# Patient Record
Sex: Female | Born: 1970 | Race: Black or African American | Hispanic: No | Marital: Married | Smoking: Never smoker
Health system: Southern US, Community
[De-identification: ages and names within clinical notes are randomized; demographics above are authoritative.]

## PROBLEM LIST (undated history)

## (undated) DIAGNOSIS — A64 Unspecified sexually transmitted disease: Secondary | ICD-10-CM

## (undated) DIAGNOSIS — R002 Palpitations: Secondary | ICD-10-CM

## (undated) DIAGNOSIS — E119 Type 2 diabetes mellitus without complications: Secondary | ICD-10-CM

## (undated) DIAGNOSIS — G2581 Restless legs syndrome: Secondary | ICD-10-CM

## (undated) DIAGNOSIS — C169 Malignant neoplasm of stomach, unspecified: Secondary | ICD-10-CM

## (undated) DIAGNOSIS — R7303 Prediabetes: Secondary | ICD-10-CM

## (undated) DIAGNOSIS — I10 Essential (primary) hypertension: Secondary | ICD-10-CM

## (undated) DIAGNOSIS — C49A0 Gastrointestinal stromal tumor, unspecified site: Secondary | ICD-10-CM

## (undated) DIAGNOSIS — M1990 Unspecified osteoarthritis, unspecified site: Secondary | ICD-10-CM

## (undated) DIAGNOSIS — R87619 Unspecified abnormal cytological findings in specimens from cervix uteri: Secondary | ICD-10-CM

## (undated) DIAGNOSIS — G4733 Obstructive sleep apnea (adult) (pediatric): Secondary | ICD-10-CM

## (undated) DIAGNOSIS — D219 Benign neoplasm of connective and other soft tissue, unspecified: Secondary | ICD-10-CM

## (undated) DIAGNOSIS — J302 Other seasonal allergic rhinitis: Secondary | ICD-10-CM

## (undated) DIAGNOSIS — K219 Gastro-esophageal reflux disease without esophagitis: Secondary | ICD-10-CM

## (undated) DIAGNOSIS — Z87898 Personal history of other specified conditions: Secondary | ICD-10-CM

## (undated) DIAGNOSIS — R079 Chest pain, unspecified: Secondary | ICD-10-CM

## (undated) DIAGNOSIS — N907 Vulvar cyst: Secondary | ICD-10-CM

## (undated) DIAGNOSIS — F419 Anxiety disorder, unspecified: Secondary | ICD-10-CM

## (undated) HISTORY — DX: Other seasonal allergic rhinitis: J30.2

## (undated) HISTORY — DX: Restless legs syndrome: G25.81

## (undated) HISTORY — DX: Unspecified abnormal cytological findings in specimens from cervix uteri: R87.619

## (undated) HISTORY — DX: Chest pain, unspecified: R07.9

## (undated) HISTORY — DX: Essential (primary) hypertension: I10

## (undated) HISTORY — DX: Anxiety disorder, unspecified: F41.9

## (undated) HISTORY — DX: Gastrointestinal stromal tumor, unspecified site: C49.A0

## (undated) HISTORY — DX: Malignant neoplasm of stomach, unspecified: C16.9

## (undated) HISTORY — DX: Unspecified sexually transmitted disease: A64

## (undated) HISTORY — DX: Type 2 diabetes mellitus without complications: E11.9

## (undated) HISTORY — DX: Prediabetes: R73.03

## (undated) HISTORY — DX: Palpitations: R00.2

## (undated) HISTORY — PX: WISDOM TOOTH EXTRACTION: SHX21

## (undated) HISTORY — DX: Obstructive sleep apnea (adult) (pediatric): G47.33

## (undated) HISTORY — DX: Benign neoplasm of connective and other soft tissue, unspecified: D21.9

## (undated) HISTORY — DX: Gastro-esophageal reflux disease without esophagitis: K21.9

## (undated) HISTORY — DX: Personal history of other specified conditions: Z87.898

---

## 1999-10-15 ENCOUNTER — Other Ambulatory Visit: Admission: RE | Admit: 1999-10-15 | Discharge: 1999-10-15 | Payer: Self-pay | Admitting: Obstetrics and Gynecology

## 2000-02-11 ENCOUNTER — Other Ambulatory Visit: Admission: RE | Admit: 2000-02-11 | Discharge: 2000-02-11 | Payer: Self-pay | Admitting: Obstetrics and Gynecology

## 2000-03-02 ENCOUNTER — Other Ambulatory Visit: Admission: RE | Admit: 2000-03-02 | Discharge: 2000-03-02 | Payer: Self-pay | Admitting: Obstetrics and Gynecology

## 2000-06-02 ENCOUNTER — Other Ambulatory Visit: Admission: RE | Admit: 2000-06-02 | Discharge: 2000-06-02 | Payer: Self-pay | Admitting: Obstetrics and Gynecology

## 2000-10-20 ENCOUNTER — Other Ambulatory Visit: Admission: RE | Admit: 2000-10-20 | Discharge: 2000-10-20 | Payer: Self-pay | Admitting: Obstetrics and Gynecology

## 2001-02-16 ENCOUNTER — Other Ambulatory Visit: Admission: RE | Admit: 2001-02-16 | Discharge: 2001-02-16 | Payer: Self-pay | Admitting: Obstetrics and Gynecology

## 2001-09-19 ENCOUNTER — Other Ambulatory Visit: Admission: RE | Admit: 2001-09-19 | Discharge: 2001-09-19 | Payer: Self-pay | Admitting: Obstetrics and Gynecology

## 2002-12-24 ENCOUNTER — Other Ambulatory Visit: Admission: RE | Admit: 2002-12-24 | Discharge: 2002-12-24 | Payer: Self-pay | Admitting: Obstetrics and Gynecology

## 2004-01-28 ENCOUNTER — Other Ambulatory Visit: Admission: RE | Admit: 2004-01-28 | Discharge: 2004-01-28 | Payer: Self-pay | Admitting: Obstetrics and Gynecology

## 2004-02-01 DIAGNOSIS — R87619 Unspecified abnormal cytological findings in specimens from cervix uteri: Secondary | ICD-10-CM

## 2004-02-01 HISTORY — PX: COLPOSCOPY: SHX161

## 2004-02-01 HISTORY — DX: Unspecified abnormal cytological findings in specimens from cervix uteri: R87.619

## 2004-02-01 HISTORY — PX: CERVICAL BIOPSY  W/ LOOP ELECTRODE EXCISION: SUR135

## 2005-03-07 ENCOUNTER — Other Ambulatory Visit: Admission: RE | Admit: 2005-03-07 | Discharge: 2005-03-07 | Payer: Self-pay | Admitting: Obstetrics and Gynecology

## 2010-06-01 DIAGNOSIS — G4733 Obstructive sleep apnea (adult) (pediatric): Secondary | ICD-10-CM

## 2010-06-01 DIAGNOSIS — G2581 Restless legs syndrome: Secondary | ICD-10-CM | POA: Insufficient documentation

## 2010-06-01 HISTORY — DX: Obstructive sleep apnea (adult) (pediatric): G47.33

## 2010-06-14 ENCOUNTER — Ambulatory Visit: Payer: Self-pay | Admitting: Internal Medicine

## 2012-09-13 ENCOUNTER — Ambulatory Visit (INDEPENDENT_AMBULATORY_CARE_PROVIDER_SITE_OTHER): Payer: BC Managed Care – PPO | Admitting: Obstetrics and Gynecology

## 2012-09-13 ENCOUNTER — Encounter: Payer: Self-pay | Admitting: Obstetrics and Gynecology

## 2012-09-13 VITALS — BP 110/80 | HR 70 | Ht 65.0 in | Wt 259.5 lb

## 2012-09-13 DIAGNOSIS — Z0000 Encounter for general adult medical examination without abnormal findings: Secondary | ICD-10-CM

## 2012-09-13 DIAGNOSIS — Z1231 Encounter for screening mammogram for malignant neoplasm of breast: Secondary | ICD-10-CM

## 2012-09-13 DIAGNOSIS — Z01419 Encounter for gynecological examination (general) (routine) without abnormal findings: Secondary | ICD-10-CM

## 2012-09-13 LAB — POCT URINALYSIS DIPSTICK
Bilirubin, UA: NEGATIVE
Glucose, UA: NEGATIVE
Leukocytes, UA: NEGATIVE
Nitrite, UA: NEGATIVE
pH, UA: 5

## 2012-09-13 NOTE — Patient Instructions (Addendum)

## 2012-09-13 NOTE — Progress Notes (Signed)
Patient ID: Krista [name], female   DOB: 12-06-1970, 42 y.o.   MRN: [id number] 42 y.o.   Unknown    African American   female   [id number]   here for annual exam.    Notes some vulvar irritation (stated after examination.)   Trying trying to loose weight.  HgbA1C was 6.2 or 6.3 with PCP.  Does well with Mirena IUD.    Now taking Paxil for anxiety through PCP.     No LMP recorded. Patient is not currently having periods (Reason: IUD).          Sexually active: yes  The current method of family planning is IUD--Mirena inserted 08/13/2012.  No problems. Exercising: walking and jogging.   Last mammogram:  09/2011 wnl:[facility] Last pap smear: 09/2011 wnl History of abnormal pap: 2006 had colposcopy and ?LEEP and paps reverted to normal. Smoking: no Alcohol: no Last colonoscopy: never Last Bone Density:  never Last tetanus shot: TDap in the last 1 - 2 years.  Last cholesterol check: 05/2012 wnl with PCP  Urine: Neg   Family History  Problem Relation Age of Onset  . Hypertension Mother   . Diabetes Father   . Hypertension Father   . Ovarian cancer Maternal Grandmother     There are no active problems to display for this patient.   Past Medical History  Diagnosis Date  . Hypertension   . Seasonal allergies   . GERD (gastroesophageal reflux disease)   . Anxiety   . Fibroid   . STD (sexually transmitted disease)     hx HPV  . Abnormal Pap smear of cervix 2006    Past Surgical History  Procedure Laterality Date  . Colposcopy  2006  . Cervical biopsy  w/ loop electrode excision  2006    Allergies: Penicillins  Current Outpatient Prescriptions  Medication Sig Dispense Refill  . losartan-hydrochlorothiazide (HYZAAR) 50-12.5 MG per tablet Take 1 tablet by mouth daily.      Marland Kitchen PARoxetine (PAXIL) 20 MG tablet Take 1 tablet by mouth daily.      . propranolol ER (INDERAL LA) 120 MG 24 hr capsule Take 1 capsule by mouth daily.      . ranitidine (ZANTAC) 300 MG tablet  Take 1 tablet by mouth daily.      . SYMBICORT 160-4.5 MCG/ACT inhaler Inhale 1 puff into the lungs as needed.       No current facility-administered medications for this visit.    ROS: Pertinent items are noted in HPI.  Social Hx:  Married.   Exam:    BP 110/80  Pulse 70  Ht 5\' 5"  (1.651 m)  Wt 259 lb 8 oz (117.708 kg)  BMI 43.18 kg/m2   Wt Readings from Last 3 Encounters:  09/13/12 259 lb 8 oz (117.708 kg)     Ht Readings from Last 3 Encounters:  09/13/12 5\' 5"  (1.651 m)    General appearance: alert, cooperative and appears stated age Head: Normocephalic, without obvious abnormality, atraumatic Neck: no adenopathy, supple, symmetrical, trachea midline and thyroid not enlarged, symmetric, no tenderness/mass/nodules Lungs: clear to auscultation bilaterally Breasts: Inspection negative, No nipple retraction or dimpling, No nipple discharge or bleeding, No axillary or supraclavicular adenopathy, Normal to palpation without dominant masses Heart: regular rate and rhythm Abdomen: soft, non-tender; no masses,  no organomegaly Extremities: extremities normal, atraumatic, no cyanosis or edema Skin: Skin color, texture, turgor normal. No rashes or lesions Lymph nodes: Cervical, supraclavicular, and axillary nodes normal. No abnormal  inguinal nodes palpated Neurologic: Grossly normal   Pelvic: External genitalia:  no lesions              Urethra:  normal appearing urethra with no masses, tenderness or lesions              Bartholins and Skenes: normal                 Vagina: normal appearing vagina with normal color and discharge, no lesions              Cervix: normal appearance.  IUD strings seen.               Pap taken: yes and high risk HPV testing.         Bimanual Exam:  Uterus:  uterus is normal size, shape, consistency and nontender                                      Adnexa: normal adnexa in size, nontender and no masses                                       Rectovaginal: Confirms                                      Anus:  normal sphincter tone, no lesions  Assessment  Normal gynecologic exam History of LEEP Mirena IUD patient.  P:     Mammogram at [facility].  Patient will call.   Order placed. pap smear and high risk HPV Get pap, colpo, and cervical dysplasia treatment records.  return annually or prn     An After Visit Summary was printed and given to the patient.

## 2012-10-23 ENCOUNTER — Telehealth: Payer: Self-pay | Admitting: Internal Medicine

## 2012-10-23 NOTE — Telephone Encounter (Signed)
Returned call.  Left message to call back before 4pm.  Unsure of what pt is referring to.  Last OV was in 2012.

## 2012-10-23 NOTE — Telephone Encounter (Signed)
Returned call.  Pt stated her application for life insurance was denied b/c she had an abnormal EKG.  Pt wanted to know why she wasn't told that at her appt.  Stated she wasn't aware of an abnormal EKG and it was shocking.  RN asked pt if she had records requested as no recent OV on file.  Pt stated she did.  Pt informed she did have an abnormal EKG w/ PVCs at her last appt and that may be where the information is coming from.  Pt upset that RN unable to answer why she wasn't informed that her EKG was abnormal.  Pt informed she will need to schedule an appt for evaluation and discussion.  Pt informed she can obtain a new EKG to see if it is still abnormal at that appt.  Pt verbalized understanding and agreed w/ plan.  Pt will call back for an appt as she is unable to come in except for 10/20 or 10/21.  Of note, Dr. Rennis Golden did mention in note that pt w/ HTN and PVCs, which is why he started pt on beta blocker.

## 2012-10-23 NOTE — Telephone Encounter (Signed)
Please call-concerning a letter she received from her insurance company.They were denying it because she had an  abnormal ekg, She said this was the first time she had heard this.

## 2012-10-23 NOTE — Telephone Encounter (Signed)
Returning your call. °

## 2012-10-24 NOTE — Telephone Encounter (Signed)
I reviewed the chart. She was referred to me for PVC's and HTN. We did discuss medication she could take to suppress/treat these, but she has not followed up with me.  Dr. Rennis Golden

## 2012-10-26 ENCOUNTER — Ambulatory Visit: Payer: Self-pay

## 2012-11-20 ENCOUNTER — Ambulatory Visit: Payer: Self-pay

## 2012-11-21 ENCOUNTER — Encounter: Payer: Self-pay | Admitting: Internal Medicine

## 2012-11-21 ENCOUNTER — Encounter: Payer: Self-pay | Admitting: *Deleted

## 2012-11-22 ENCOUNTER — Encounter: Payer: Self-pay | Admitting: Internal Medicine

## 2012-11-22 ENCOUNTER — Ambulatory Visit (INDEPENDENT_AMBULATORY_CARE_PROVIDER_SITE_OTHER): Payer: BC Managed Care – PPO | Admitting: Internal Medicine

## 2012-11-22 ENCOUNTER — Encounter (HOSPITAL_COMMUNITY): Payer: Self-pay | Admitting: *Deleted

## 2012-11-22 VITALS — BP 144/90 | HR 79 | Ht 66.0 in | Wt 262.4 lb

## 2012-11-22 DIAGNOSIS — R0600 Dyspnea, unspecified: Secondary | ICD-10-CM

## 2012-11-22 DIAGNOSIS — I493 Ventricular premature depolarization: Secondary | ICD-10-CM

## 2012-11-22 DIAGNOSIS — G4733 Obstructive sleep apnea (adult) (pediatric): Secondary | ICD-10-CM | POA: Insufficient documentation

## 2012-11-22 DIAGNOSIS — I10 Essential (primary) hypertension: Secondary | ICD-10-CM

## 2012-11-22 DIAGNOSIS — R0609 Other forms of dyspnea: Secondary | ICD-10-CM

## 2012-11-22 DIAGNOSIS — I4949 Other premature depolarization: Secondary | ICD-10-CM

## 2012-11-22 DIAGNOSIS — R9431 Abnormal electrocardiogram [ECG] [EKG]: Secondary | ICD-10-CM | POA: Insufficient documentation

## 2012-11-22 HISTORY — DX: Abnormal electrocardiogram (ECG) (EKG): R94.31

## 2012-11-22 HISTORY — DX: Essential (primary) hypertension: I10

## 2012-11-22 HISTORY — DX: Morbid (severe) obesity due to excess calories: E66.01

## 2012-11-22 HISTORY — DX: Other forms of dyspnea: R06.09

## 2012-11-22 HISTORY — DX: Obstructive sleep apnea (adult) (pediatric): G47.33

## 2012-11-22 HISTORY — DX: Ventricular premature depolarization: I49.3

## 2012-11-22 MED ORDER — LOSARTAN POTASSIUM-HCTZ 100-12.5 MG PO TABS: 1.0000 | ORAL_TABLET | Freq: Every day | ORAL | Status: DC

## 2012-11-22 NOTE — Patient Instructions (Signed)
Your physician has requested that you have an echocardiogram. Echocardiography is a painless test that uses sound waves to create images of your heart. It provides your doctor with information about the size and shape of your heart and how well your heart's chambers and valves are working. This procedure takes approximately one hour. There are no restrictions for this procedure.  Your physician has requested that you have en exercise stress myoview. For further information please visit [email or web address] Please follow instruction sheet, as given.  Your physician has recommended you make the following change in your medication... Start taking losartan-hctz 100-12.5mg  daily (this is an increased from your 50-12.5mg  dose) This has been sent to your pharmacy.  Your physician recommends that you schedule a follow-up appointment after your tests.

## 2012-11-22 NOTE — Progress Notes (Signed)
OFFICE NOTE  Chief Complaint:  Follow-up, denied insurance due to abnormal EKG  Primary Care Physician: Krista Miyamoto, MD  HPI:  Krista [name] is a pleasant 42 year old female that I last saw on 06/22/2010. She was originally referred to me by Dr. Clarene [name] for evaluation of frequent PVCs and palpitations. She had worn a monitor in his office for 24 hours. During that period of time she had 120,000 PVCs, comprising about a 10% burden of her heart rates.  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  I placed her on a nonselective beta blocker due to fact that she also had some anxiety and she seemed to tolerate this well. The dose was subsequently increased up to 120 mg and she reports marked improvement in her palpitations. In addition she was diagnosed with obstructive sleep apnea and placed on CPAP which she has been using and it is significantly improved her energy level. A 4 Shiley she has not been able to lose weight in fact has gained about 10 pounds since I last saw her. She does have hypertension and her blood pressure continues to run high normal. A repeat EKG was performed in the office today which does not show any PVCs, however she does have T-wave inversions in the inferior and lateral leads. When queried about the EKG changes she reports that she does have significant shortness of breath but she notes when she exercises. She denies any chest pain. The shortness of breath symptoms have been getting worse, but she thinks that they may just be related to inactivity and weight gain. She was unaware initially that her EKG was abnormal however this is clearly documented in our earlier office notes.  She never underwent any additional testing at that time. She has not had any cardiac events over the past 2 years.  PMHx:  Past Medical History  Diagnosis Date  . Hypertension   . Seasonal allergies     . GERD (gastroesophageal reflux disease)   . Anxiety   . Fibroid   . STD (sexually transmitted disease)     hx HPV  . Abnormal Pap smear of cervix 2006  . History of palpitations     PVCs - wore holter monitor   . Restless leg syndrome   . OSA (obstructive sleep apnea)     AHI during total sleep 5.9/hr and REM sleep 28.1/hr (mild OSA) 06/2010    Past Surgical History  Procedure Laterality Date  . Colposcopy  2006  . Cervical biopsy  w/ loop electrode excision  2006    FAMHx:  Family History  Problem Relation Age of Onset  . Hypertension Mother   . Diabetes Father   . Hypertension Father   . Ovarian cancer Maternal Grandmother   . Hypertension Maternal Grandmother   . Stroke Maternal Grandfather   . Diabetes Paternal Grandmother   . Hypertension Paternal Grandmother   . Alzheimer's disease Paternal Grandfather     SOCHx:   reports that she has never smoked. She has never used smokeless tobacco. She reports that she does not drink alcohol or use illicit drugs.  ALLERGIES:  Allergies  Allergen Reactions  . Ace Inhibitors Cough  . Penicillins Swelling and Rash    ROS: A comprehensive review of systems was negative except for: Respiratory: positive for dyspnea on exertion Cardiovascular: positive for palpitations  HOME MEDS: Current Outpatient Prescriptions  Medication Sig Dispense Refill  .  Fexofenadine HCl (ALLEGRA PO) Take by mouth as needed.      . GuaiFENesin (MUCINEX PO) Take by mouth as needed.      . Multiple Vitamin (MULTIVITAMIN) capsule Take 1 capsule by mouth daily.      Marland Kitchen PARoxetine (PAXIL) 20 MG tablet Take 1 tablet by mouth daily.      . propranolol ER (INDERAL LA) 120 MG 24 hr capsule Take 1 capsule by mouth daily.      . ranitidine (ZANTAC) 300 MG tablet Take 1 tablet by mouth daily.      . SYMBICORT 160-4.5 MCG/ACT inhaler Inhale 1 puff into the lungs as needed.      Marland Kitchen losartan-hydrochlorothiazide (HYZAAR) 100-12.5 MG per tablet Take 1 tablet by  mouth daily.  30 tablet  11   No current facility-administered medications for this visit.    LABS/IMAGING: No results found for this or any previous visit (from the past 48 hour(s)). No results found.  VITALS: BP 144/90  Pulse 79  Ht 5\' 6"  (1.676 m)  Wt 262 lb 6.4 oz (119.024 kg)  BMI 42.37 kg/m2  EXAM: General appearance: alert and no distress Neck: no carotid bruit and no JVD Lungs: clear to auscultation bilaterally Heart: regular rate and rhythm, S1, S2 normal, no murmur, click, rub or gallop Abdomen: soft, non-tender; bowel sounds normal; no masses,  no organomegaly and obese and non-tender Extremities: extremities normal, atraumatic, no cyanosis or edema Pulses: 2+ and symmetric Skin: Skin color, texture, turgor normal. No rashes or lesions Neurologic: Grossly normal Psych: Mood, affect normal  EKG: Normal sinus rhythm at 79, inferior and lateral T-wave inversions, no PVC's  ASSESSMENT: 1. 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 New onset shortness of breath with exertion 3. Abnormal EKG with inferior and lateral T-wave inversions 4. Hypertension-uncontrolled 5. Morbid obesity with recent 10 pound weight gain 6. Sedentary lifestyle  PLAN: 1.   [name] has had recent weight gain and shortness of breath which may be related, however her EKG does show inferior and lateral T-wave inversions. I do not see any PVCs on her EKG today, and she reports her palpitations have improved significantly on a beta blocker. Given her EKG changes and her shortness of breath, I would like to rule out ischemia as a possible cause of her PVCs and these symptoms. In addition an echocardiogram to look for structural abnormalities as warranted. Based on the results of these studies we can better explain the abnormalities on her EKG and her symptoms. Either way he she's encouraged to improve her activity and work on weight loss. With  regards to her hypertension, I would like to increase her losartan/HCTZ to 100/12.5 mg daily. For the short term she can take 2 of her existing pills but would not take her additional HCTZ. Plan to see her back in followup to discuss results of her stress test and echocardiogram.  Chrystie Nose, MD, [facility] Attending Cardiologist [facility]  Bryer Gottsch C 11/22/2012, 2:17 PM

## 2012-11-28 ENCOUNTER — Ambulatory Visit (HOSPITAL_COMMUNITY)
Admission: RE | Admit: 2012-11-28 | Discharge: 2012-11-28 | Disposition: A | Discharge status: Home / Self Care | Payer: BC Managed Care – PPO | Source: Ambulatory Visit | Attending: Cardiovascular Disease | Admitting: Cardiovascular Disease

## 2012-11-28 DIAGNOSIS — I10 Essential (primary) hypertension: Secondary | ICD-10-CM | POA: Insufficient documentation

## 2012-11-28 DIAGNOSIS — R42 Dizziness and giddiness: Secondary | ICD-10-CM | POA: Insufficient documentation

## 2012-11-28 DIAGNOSIS — R0600 Dyspnea, unspecified: Secondary | ICD-10-CM

## 2012-11-28 DIAGNOSIS — E119 Type 2 diabetes mellitus without complications: Secondary | ICD-10-CM | POA: Insufficient documentation

## 2012-11-28 DIAGNOSIS — I4949 Other premature depolarization: Secondary | ICD-10-CM

## 2012-11-28 DIAGNOSIS — R0989 Other specified symptoms and signs involving the circulatory and respiratory systems: Secondary | ICD-10-CM | POA: Insufficient documentation

## 2012-11-28 DIAGNOSIS — R002 Palpitations: Secondary | ICD-10-CM | POA: Insufficient documentation

## 2012-11-28 DIAGNOSIS — R0602 Shortness of breath: Secondary | ICD-10-CM | POA: Insufficient documentation

## 2012-11-28 DIAGNOSIS — I493 Ventricular premature depolarization: Secondary | ICD-10-CM

## 2012-11-28 DIAGNOSIS — R0609 Other forms of dyspnea: Secondary | ICD-10-CM

## 2012-11-28 DIAGNOSIS — R5381 Other malaise: Secondary | ICD-10-CM | POA: Insufficient documentation

## 2012-11-28 DIAGNOSIS — E669 Obesity, unspecified: Secondary | ICD-10-CM | POA: Insufficient documentation

## 2012-11-28 DIAGNOSIS — R9431 Abnormal electrocardiogram [ECG] [EKG]: Secondary | ICD-10-CM

## 2012-11-28 MED ORDER — TECHNETIUM TC 99M SESTAMIBI GENERIC - CARDIOLITE
30.9000 | Freq: Once | INTRAVENOUS | Status: AC | PRN
  Administered 2012-11-28: 30.9 via INTRAVENOUS

## 2012-11-28 MED ORDER — TECHNETIUM TC 99M SESTAMIBI GENERIC - CARDIOLITE
9.9000 | Freq: Once | INTRAVENOUS | Status: AC | PRN
  Administered 2012-11-28: 10 via INTRAVENOUS

## 2012-11-28 NOTE — Procedures (Addendum)
[facility] [address] [place] [address] [place] [address] [phone]  Cardiology Nuclear Med Study  Krista [name] is a 42 y.o. female     MRN : [id number]     DOB: [birth year]  Procedure Date: 11/28/2012  Nuclear Med Background Indication for Stress Test:  Abnormal EKG History:  PVC'S Cardiac Risk Factors: Hypertension, NIDDM and Obesity  Symptoms:  DOE, Fatigue, Light-Headedness, Palpitations and SOB   Nuclear Pre-Procedure Caffeine/Decaff Intake:  7:00pm NPO After: 5:00am   IV Site: R Hand  IV 0.9% NS with Angio Cath:  22g  Chest Size (in):  N/A IV Started by: Emmit Pomfret, RN  Height: 5\' 2"  (1.575 m)  Cup Size: DD  BMI:  Body mass index is 47.91 kg/(m^2). Weight:  262 lb (118.842 kg)   Tech Comments:  N/A    Nuclear Med Study 1 or 2 day study: 1 day  Stress Test Type:  Stress  Order Authorizing Provider:  Zoila Shutter, MD   Resting Radionuclide: Technetium 73m Sestamibi  Resting Radionuclide Dose: 9.9 mCi   Stress Radionuclide:  Technetium 63m Sestamibi  Stress Radionuclide Dose: 30.9 mCi           Stress Protocol Rest HR: 75 Stress HR: 160  Rest BP: 151/91 Stress BP: 221/107  Exercise Time (min): 9:30 METS: 10.1   Predicted Max HR: 177 bpm % Max HR: 90.4 bpm Rate Pressure Product: 21308  Dose of Adenosine (mg):  n/a Dose of Lexiscan: 0.4mg   Dose of Atropine (mg): n/a Dose of Dobutamine: n/a mcg/kg/min (at max HR)  Stress Test Technologist: Esperanza Sheets, CCT Nuclear Technologist: Gonzella Lex, CNMT   Rest Procedure:  Myocardial perfusion imaging was performed at rest 45 minutes following the intravenous administration of Technetium 41m Sestamibi. Stress Procedure:  The patient performed treadmill exercise using a Bruce  Protocol for 9:30 minutes. The patient stopped due to marked SOB and denied any chest pain.  There were no significant ST-T wave changes.  Technetium 69m Sestamibi was injected at peak exercise  and myocardial perfusion imaging was performed after a brief delay.   Transient Ischemic Dilatation (Normal <1.22):  0.82 Lung/Heart Ratio (Normal <0.45):  0.27 QGS EDV:  70 ml QGS ESV:  24 ml LV Ejection Fraction: 66%     Rest ECG: NSR-LVH  Stress ECG: No significant change from baseline ECG  QPS Raw Data Images:  There is a breast shadow that accounts for the anterior attenuation. Stress Images:  There is decreased uptake in the anterior wall. Rest Images:  There is decreased uptake in the anterior wall. Subtraction (SDS):  There is a fixed anterior defect that is most consistent with breast attenuation. LV Wall Motion:  NL LV Function; NL Wall Motion  Impression Exercise Capacity:  Good exercise capacity. BP Response:  Hypertensive blood pressure response. Clinical Symptoms:  The exercise was limited by dyspnea. ECG Impression:  No significant ST segment change suggestive of ischemia. Comparison with Prior Nuclear Study: No previous nuclear study performed   Overall Impression:  Low risk stress nuclear study with breast attenuation artifact.[name], MD  11/28/2012 12:38 PM

## 2012-12-06 ENCOUNTER — Other Ambulatory Visit: Payer: Self-pay

## 2012-12-11 ENCOUNTER — Ambulatory Visit (HOSPITAL_COMMUNITY)
Admission: RE | Admit: 2012-12-11 | Discharge: 2012-12-11 | Disposition: A | Discharge status: Home / Self Care | Payer: BC Managed Care – PPO | Source: Ambulatory Visit | Attending: Cardiovascular Disease | Admitting: Cardiovascular Disease

## 2012-12-11 DIAGNOSIS — I4949 Other premature depolarization: Secondary | ICD-10-CM

## 2012-12-11 DIAGNOSIS — R0600 Dyspnea, unspecified: Secondary | ICD-10-CM

## 2012-12-11 DIAGNOSIS — I10 Essential (primary) hypertension: Secondary | ICD-10-CM | POA: Insufficient documentation

## 2012-12-11 DIAGNOSIS — R0602 Shortness of breath: Secondary | ICD-10-CM | POA: Insufficient documentation

## 2012-12-11 DIAGNOSIS — R9431 Abnormal electrocardiogram [ECG] [EKG]: Secondary | ICD-10-CM

## 2012-12-11 DIAGNOSIS — I493 Ventricular premature depolarization: Secondary | ICD-10-CM

## 2012-12-11 DIAGNOSIS — R0609 Other forms of dyspnea: Secondary | ICD-10-CM

## 2012-12-11 NOTE — Progress Notes (Signed)
2D Echo Performed 12/11/2012    Won Kreuzer, RCS  

## 2012-12-26 ENCOUNTER — Encounter: Payer: Self-pay | Admitting: Internal Medicine

## 2012-12-26 ENCOUNTER — Ambulatory Visit
Admission: RE | Admit: 2012-12-26 | Discharge: 2012-12-26 | Disposition: A | Discharge status: Home / Self Care | Payer: BC Managed Care – PPO | Source: Ambulatory Visit | Attending: Obstetrics and Gynecology | Admitting: Obstetrics and Gynecology

## 2012-12-26 ENCOUNTER — Ambulatory Visit (INDEPENDENT_AMBULATORY_CARE_PROVIDER_SITE_OTHER): Payer: BC Managed Care – PPO | Admitting: Internal Medicine

## 2012-12-26 DIAGNOSIS — I10 Essential (primary) hypertension: Secondary | ICD-10-CM

## 2012-12-26 DIAGNOSIS — G4733 Obstructive sleep apnea (adult) (pediatric): Secondary | ICD-10-CM

## 2012-12-26 DIAGNOSIS — Z1231 Encounter for screening mammogram for malignant neoplasm of breast: Secondary | ICD-10-CM

## 2012-12-26 DIAGNOSIS — I4949 Other premature depolarization: Secondary | ICD-10-CM

## 2012-12-26 DIAGNOSIS — I493 Ventricular premature depolarization: Secondary | ICD-10-CM

## 2012-12-26 NOTE — Progress Notes (Signed)
OFFICE NOTE  Chief Complaint:  Follow-up, denied insurance due to abnormal EKG  Primary Care Physician: Lucianne Lei, MD  HPI:  Krista [name] is a pleasant 42 year old female that I last saw on 06/22/2010. She was originally referred to me by Dr. Clarene Duke for evaluation of frequent PVCs and palpitations. She had worn a monitor in his office for 24 hours. During that period of time she had 120,000 PVCs, comprising about a 10% burden of her heart rates.  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  I placed her on a nonselective beta blocker due to fact that she also had some anxiety and she seemed to tolerate this well. The dose was subsequently increased up to 120 mg and she reports marked improvement in her palpitations. In addition she was diagnosed with obstructive sleep apnea and placed on CPAP which she has been using and it is significantly improved her energy level. A 4 Shiley she has not been able to lose weight in fact has gained about 10 pounds since I last saw her. She does have hypertension and her blood pressure continues to run high normal. A repeat EKG was performed in the office today which does not show any PVCs, however she does have T-wave inversions in the inferior and lateral leads. When queried about the EKG changes she reports that she does have significant shortness of breath but she notes when she exercises. She denies any chest pain. The shortness of breath symptoms have been getting worse, but she thinks that they may just be related to inactivity and weight gain. She was unaware initially that her EKG was abnormal however this is clearly documented in our earlier office notes.  She never underwent any additional testing at that time. She has not had any cardiac events over the past 2 years.  Mrs. Agresta was referred for a stress test and echocardiogram to evaluate for possible causes of  her PVCs. Her echocardiogram was essentially normal with no significant valvular abnormalities. Her stress test was low risk and demonstrated a small amount of breast artifact but no ischemia. Overall the test suggests that the heart is in pretty good shape. She reports marked improvement in her shortness of breath and she's had complete suppression of her PVCs with low-dose beta blocker. In addition I increased her losartan HCTZ to 100/12.5 mg and her blood pressure today is 122/82.  PMHx:  Past Medical History  Diagnosis Date  . Hypertension   . Seasonal allergies   . GERD (gastroesophageal reflux disease)   . Anxiety   . Fibroid   . STD (sexually transmitted disease)     hx HPV  . Abnormal Pap smear of cervix 2006  . History of palpitations     PVCs - wore holter monitor   . Restless leg syndrome   . OSA (obstructive sleep apnea)     AHI during total sleep 5.9/hr and REM sleep 28.1/hr (mild OSA) 06/2010    Past Surgical History  Procedure Laterality Date  . Colposcopy  2006  . Cervical biopsy  w/ loop electrode excision  2006    FAMHx:  Family History  Problem Relation Age of Onset  . Hypertension Mother   . Diabetes Father   . Hypertension Father   . Ovarian cancer Maternal Grandmother   . Hypertension Maternal Grandmother   . Stroke Maternal Grandfather   . Diabetes Paternal Grandmother   .  Hypertension Paternal Grandmother   . Alzheimer's disease Paternal Grandfather     SOCHx:   reports that she has never smoked. She has never used smokeless tobacco. She reports that she does not drink alcohol or use illicit drugs.  ALLERGIES:  Allergies  Allergen Reactions  . Ace Inhibitors Cough  . Penicillins Swelling and Rash    ROS: A comprehensive review of systems was negative.  HOME MEDS: Current Outpatient Prescriptions  Medication Sig Dispense Refill  . FARXIGA 5 MG TABS Take 1 tablet by mouth daily.      Marland Kitchen Fexofenadine HCl (ALLEGRA PO) Take by mouth as needed.       . GuaiFENesin (MUCINEX PO) Take by mouth as needed.      Marland Kitchen losartan-hydrochlorothiazide (HYZAAR) 100-12.5 MG per tablet Take 1 tablet by mouth daily.  30 tablet  11  . Multiple Vitamin (MULTIVITAMIN) capsule Take 1 capsule by mouth daily.      Marland Kitchen PARoxetine (PAXIL) 20 MG tablet Take 1 tablet by mouth daily.      . propranolol ER (INDERAL LA) 120 MG 24 hr capsule Take 1 capsule by mouth daily.      . ranitidine (ZANTAC) 300 MG tablet Take 1 tablet by mouth daily.      . SYMBICORT 160-4.5 MCG/ACT inhaler Inhale 1 puff into the lungs as needed.       No current facility-administered medications for this visit.    LABS/IMAGING: No results found for this or any previous visit (from the past 48 hour(s)). No results found.  VITALS: BP 122/82  Pulse 68  Ht 5\' 2"  (1.575 m)  Wt 255 lb 9.6 oz (115.939 kg)  BMI 46.74 kg/m2  EXAM: General appearance: alert and no distress Neck: no carotid bruit and no JVD Lungs: clear to auscultation bilaterally Heart: regular rate and rhythm, S1, S2 normal, no murmur, click, rub or gallop Abdomen: soft, non-tender; bowel sounds normal; no masses,  no organomegaly and obese and non-tender Extremities: extremities normal, atraumatic, no cyanosis or edema Pulses: 2+ and symmetric Skin: Skin color, texture, turgor normal. No rashes or lesions Neurologic: Grossly normal Psych: Mood, affect normal  EKG: deferred  ASSESSMENT: 1. 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 DOE - resolved, negative nuclear stress test, echo shows normal systolic function and no valvular abnormalities 3. Hypertension- now controlled 4. Morbid obesity - weight loss of 5 pounds and healthy lifestyle and exercise now in place  PLAN: 1.   Ms. Schecter has reported improvement in her shortness of breath. Her blood pressure is now better controlled on increased dose of her losartan HCTZ. Her beta blocker has completely  suppressed and he PVCs. She has managed to lose about 5 pounds of weight and started doing more exercise very regularly. She's also made changes in her diet and has started out a good path. Her stress test was negative for ischemia and her echocardiogram showed normal function with no valvular abnormalities. 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 Given the fact that they're easy to suppress with a beta blocker, I do not believe they're related to underlying ischemia.  Plan to see her back in 6 months for ongoing risk factor modification.  Chrystie Nose, MD, [facility] Attending Cardiologist [facility]  HILTY,Kenneth C 12/26/2012, 9:11 AM

## 2013-09-10 ENCOUNTER — Telehealth: Payer: Self-pay | Admitting: Obstetrics and Gynecology

## 2013-09-10 NOTE — Telephone Encounter (Signed)
rescheduled

## 2013-09-10 NOTE — Telephone Encounter (Signed)
Need to rs pts appt due to surgery lmtcb

## 2013-09-16 ENCOUNTER — Ambulatory Visit: Payer: BC Managed Care – PPO | Admitting: Obstetrics and Gynecology

## 2013-10-09 ENCOUNTER — Telehealth: Payer: Self-pay | Admitting: Obstetrics and Gynecology

## 2013-10-09 NOTE — Telephone Encounter (Signed)
Need to rs pts appt

## 2013-11-08 ENCOUNTER — Ambulatory Visit: Payer: BC Managed Care – PPO | Admitting: Obstetrics and Gynecology

## 2013-11-27 ENCOUNTER — Telehealth: Payer: Self-pay | Admitting: Gynecology

## 2013-11-27 NOTE — Telephone Encounter (Signed)
lmtcb to reschedule AEX from 12/11/13 with Dr. Susanne Borders

## 2013-12-02 ENCOUNTER — Telehealth: Payer: Self-pay | Admitting: Obstetrics and Gynecology

## 2013-12-02 ENCOUNTER — Encounter: Payer: Self-pay | Admitting: Internal Medicine

## 2013-12-02 NOTE — Telephone Encounter (Signed)
Spoke with patient. Patient states that she had her annual exam scheduled for Dr.Silva but it had to be reschedule with Dr.Lathrop on 11/11. Patient was called and needed to reschedule that appointment as well. States that she is seen with another doctor regarding her blood sugar and has been having trouble with yeast infections with fluctuations in her blood sugar. Patient also has Mirena IUD and has been having light cycles every month. "Before I never had a cycle and I am concerned. I am over due for my annual and I really only want to see a doctor but they told me Dr.Silva does not have an annual until March." Appointment scheduled for annual with Dr.Silva on 11/12 at 3pm. Patient is agreeable to date and time.  Routing to provider for final review. Patient agreeable to disposition. Will close encounter

## 2013-12-02 NOTE — Telephone Encounter (Signed)
Left message to call Kaitlyn at [phone]. 

## 2013-12-02 NOTE — Telephone Encounter (Signed)
Patient is a patient of Dr. Elza Rafter who has had her AEX cancelled with Dr. Quincy Simmonds then rescheduled with Dr. Charlies Constable. She is declining to schedule anything for her AEX at this time as she doesn't want to see a nurse practitioner or wait for Dr. Quincy Simmonds. She reports she is having symptoms of a yeast infection but doesn't want to schedule until she speaks with a nurse.

## 2013-12-11 ENCOUNTER — Ambulatory Visit: Payer: BC Managed Care – PPO | Admitting: Gynecology

## 2013-12-12 ENCOUNTER — Ambulatory Visit (INDEPENDENT_AMBULATORY_CARE_PROVIDER_SITE_OTHER): Payer: BC Managed Care – PPO | Admitting: Obstetrics and Gynecology

## 2013-12-12 ENCOUNTER — Encounter: Payer: Self-pay | Admitting: Obstetrics and Gynecology

## 2013-12-12 VITALS — BP 110/62 | HR 84 | Resp 20 | Ht 64.5 in | Wt 239.0 lb

## 2013-12-12 DIAGNOSIS — Z01419 Encounter for gynecological examination (general) (routine) without abnormal findings: Secondary | ICD-10-CM

## 2013-12-12 DIAGNOSIS — Z0000 Encounter for general adult medical examination without abnormal findings: Secondary | ICD-10-CM

## 2013-12-12 LAB — POCT URINALYSIS DIPSTICK
BILIRUBIN UA: NEGATIVE
Blood, UA: NEGATIVE
KETONES UA: NEGATIVE
LEUKOCYTES UA: NEGATIVE
Nitrite, UA: NEGATIVE
Protein, UA: NEGATIVE
Urobilinogen, UA: NEGATIVE
pH, UA: 5

## 2013-12-12 NOTE — Progress Notes (Signed)
Patient ID: Krista [name], female   DOB: 1970-02-14, 43 y.o.   MRN: [id number] 43 y.o. G1P1001 MarriedAfrican AmericanF here for annual exam.    Having menses with Mirena.  Menses monthly for 3 months. Last 4 -5 days and are light. No heavy flooding.  Can have cramping.   Can feel an area of the left breast that comes and goes.   Has elevated blood sugar and having yeast infections.  Rx of Diflucan due to the yeast.  Also did treatment for sinus infection.  Just started daily probiotics and eating Yogurt daily.   Loosing weight.  Lost at least 10  - 16 pounds.  Started teaching business education.   Taking vit D.   PCP:   [name], MD  Patient's last menstrual period was 11/25/2013 (exact date).          Sexually active: Yes.  female  The current method of family planning is IUD--Mirena inserted 08/13/12. Exercising: Yes.    walking. Smoker:  no  Health Maintenance: Pap:  09-13-12 wnl:neg HR HPV History of abnormal Pap:  Yes, 2006 colposcopy and possible LEEP procedure.  Paps normal since. MMG:  12-26-12 fibroglandular density/nl:[facility].  Will schedule.  Colonoscopy:   --- BMD:   --- TDaP:  2013 Screening Labs:  Hb today: PCP, Urine today: Mod. Glucose   reports that she has never smoked. She has never used smokeless tobacco. She reports that she does not drink alcohol or use illicit drugs.  Past Medical History  Diagnosis Date  . Hypertension   . Seasonal allergies   . GERD (gastroesophageal reflux disease)   . Anxiety   . Fibroid   . STD (sexually transmitted disease)     hx HPV  . Abnormal Pap smear of cervix 2006  . History of palpitations     PVCs - wore holter monitor   . Restless leg syndrome   . OSA (obstructive sleep apnea)     AHI during total sleep 5.9/hr and REM sleep 28.1/hr (mild OSA) 06/2010  . Pre-diabetes     Past Surgical History  Procedure Laterality Date  . Colposcopy  2006  . Cervical biopsy  w/ loop electrode excision  2006     Current Outpatient Prescriptions  Medication Sig Dispense Refill  . ACCU-CHEK AVIVA PLUS test strip     . esomeprazole (NEXIUM) 40 MG capsule Take 40 mg by mouth daily.    Marland Kitchen FARXIGA 5 MG TABS Take 1 tablet by mouth daily.    . GuaiFENesin (MUCINEX PO) Take by mouth as needed.    Marland Kitchen losartan-hydrochlorothiazide (HYZAAR) 100-12.5 MG per tablet Take 1 tablet by mouth daily. 30 tablet 11  . montelukast (SINGULAIR) 10 MG tablet Take 10 mg by mouth daily.    . Multiple Vitamin (MULTIVITAMIN) capsule Take 1 capsule by mouth daily.    Marland Kitchen PARoxetine (PAXIL) 20 MG tablet Take 1 tablet by mouth daily.    . propranolol ER (INDERAL LA) 120 MG 24 hr capsule Take 1 capsule by mouth daily.    . SYMBICORT 160-4.5 MCG/ACT inhaler Inhale 1 puff into the lungs as needed.    . Vitamin D, Ergocalciferol, (DRISDOL) 50000 UNITS CAPS capsule Take 50,000 Units by mouth daily.     No current facility-administered medications for this visit.    Family History  Problem Relation Age of Onset  . Hypertension Mother   . Diabetes Father   . Hypertension Father   . Ovarian cancer Maternal Grandmother   .  Hypertension Maternal Grandmother   . Stroke Maternal Grandfather   . Diabetes Paternal Grandmother   . Hypertension Paternal Grandmother   . Alzheimer's disease Paternal Grandfather   . Cancer Maternal Aunt     dec bladder ca    ROS:  Pertinent items are noted in HPI.  Otherwise, a comprehensive ROS was negative.  Exam:   BP 110/62 mmHg  Pulse 84  Resp 20  Ht 5' 4.5" (1.638 m)  Wt 239 lb (108.41 kg)  BMI 40.41 kg/m2  LMP 11/25/2013 (Exact Date)     Height: 5' 4.5" (163.8 cm)  Ht Readings from Last 3 Encounters:  12/12/13 5' 4.5" (1.638 m)  12/26/12 5\' 2"  (1.575 m)  11/28/12 5\' 2"  (1.575 m)    General appearance: alert, cooperative and appears stated age Head: Normocephalic, without obvious abnormality, atraumatic Neck: no adenopathy, supple, symmetrical, trachea midline and thyroid normal to  inspection and palpation Lungs: clear to auscultation bilaterally Breasts: normal appearance, no masses or tenderness, Inspection negative, No nipple retraction or dimpling, No nipple discharge or bleeding, No axillary or supraclavicular adenopathy Heart: regular rate and rhythm Abdomen: soft, non-tender; bowel sounds normal; no masses,  no organomegaly Extremities: extremities normal, atraumatic, no cyanosis or edema Skin: Skin color, texture, turgor normal. No rashes or lesions Lymph nodes: Cervical, supraclavicular, and axillary nodes normal. No abnormal inguinal nodes palpated Neurologic: Grossly normal   Pelvic: External genitalia:  no lesions              Urethra:  normal appearing urethra with no masses, tenderness or lesions              Bartholins and Skenes: normal                 Vagina: normal appearing vagina with normal color and discharge, no lesions              Cervix: no lesions and  IUD strings seen.               Pap taken: Yes.   Bimanual Exam:  Uterus:  normal and normal size, contour, position, consistency, mobility, non-tender              Adnexa: normal adnexa and no mass, fullness, tenderness               Rectovaginal: Confirms               Anus:  normal sphincter tone, no lesions  A:  Well Woman with normal exam Mirena IUD patient.  Having menses.  Diabetes mellitus.   P:   Mammogram.  Patient will call to schedule. pap smear and HR HPV testing.  Reassurance on cycles given.   return annually or prn  An After Visit Summary was printed and given to the patient.

## 2013-12-12 NOTE — Patient Instructions (Signed)

## 2013-12-17 LAB — IPS PAP TEST WITH HPV

## 2014-02-03 ENCOUNTER — Other Ambulatory Visit: Payer: Self-pay

## 2014-02-03 ENCOUNTER — Telehealth: Payer: Self-pay | Admitting: Obstetrics and Gynecology

## 2014-02-03 DIAGNOSIS — Z1231 Encounter for screening mammogram for malignant neoplasm of breast: Secondary | ICD-10-CM

## 2014-02-03 NOTE — Telephone Encounter (Signed)
Attempted to reach patient at number provided [phone]. No answer and recording states "The person you have reached has a mailbox that is full and not accepting new messages at this time. Please try again later. Goodbye."

## 2014-02-03 NOTE — Telephone Encounter (Signed)
Spoke with patient. Patient states that she is a diabetic and currently takes po medication for treatment. Over the last couple of months around the time of her cycle she begins to have vaginal itching and "symptoms like I have a yeast infection." Symptoms last after cycle as well. Patient has been using monistat monthly but states it does not provide complete relief. "I think it is due to my sugar levels. I was hoping to get a prescription to take monthly for this." Advised patient will need to be seen for appointment with Dr.Silva. Patient is agreeable. Requesting appointment on 02/17/14 as she has the day off. Offered appointment at 1pm on 1/18. Patient is agreeable but will need to call to move mammogram appointment to an earlier time that morning. Advised once she has done this to return call for scheduling as I am not able to overbook that appointment. Patient is agreeable.

## 2014-02-03 NOTE — Telephone Encounter (Signed)
Patient is having problems with "itching and her monthly" Patient then said she is a diabetic.

## 2014-02-10 NOTE — Telephone Encounter (Signed)
Spoke with patient. Patient states that she has been taking probiotics and her symptoms have resolved. Patient will return call to schedule appointment if symptoms return.  Routing to provider for final review. Patient agreeable to disposition. Will close encounter

## 2014-02-17 ENCOUNTER — Ambulatory Visit
Admission: RE | Admit: 2014-02-17 | Discharge: 2014-02-17 | Disposition: A | Discharge status: Home / Self Care | Payer: BC Managed Care – PPO | Source: Ambulatory Visit

## 2014-02-17 DIAGNOSIS — Z1231 Encounter for screening mammogram for malignant neoplasm of breast: Secondary | ICD-10-CM

## 2014-10-02 ENCOUNTER — Telehealth: Payer: Self-pay | Admitting: Obstetrics and Gynecology

## 2014-10-02 NOTE — Telephone Encounter (Signed)
Call to patient about canceled appointment. She states she will call back to reschedule. Patient is in recall.

## 2014-12-19 ENCOUNTER — Ambulatory Visit: Payer: BC Managed Care – PPO | Admitting: Obstetrics and Gynecology

## 2015-01-05 ENCOUNTER — Other Ambulatory Visit: Payer: Self-pay

## 2015-01-05 DIAGNOSIS — Z1231 Encounter for screening mammogram for malignant neoplasm of breast: Secondary | ICD-10-CM

## 2015-01-09 ENCOUNTER — Encounter: Payer: Self-pay | Admitting: Obstetrics and Gynecology

## 2015-01-09 ENCOUNTER — Ambulatory Visit (INDEPENDENT_AMBULATORY_CARE_PROVIDER_SITE_OTHER): Payer: BC Managed Care – PPO | Admitting: Obstetrics and Gynecology

## 2015-01-09 VITALS — BP 118/78 | HR 88 | Resp 18 | Ht 65.0 in | Wt 247.2 lb

## 2015-01-09 DIAGNOSIS — N760 Acute vaginitis: Secondary | ICD-10-CM | POA: Diagnosis not present

## 2015-01-09 DIAGNOSIS — N920 Excessive and frequent menstruation with regular cycle: Secondary | ICD-10-CM

## 2015-01-09 DIAGNOSIS — Z01419 Encounter for gynecological examination (general) (routine) without abnormal findings: Secondary | ICD-10-CM

## 2015-01-09 DIAGNOSIS — Z0000 Encounter for general adult medical examination without abnormal findings: Secondary | ICD-10-CM

## 2015-01-09 DIAGNOSIS — N9089 Other specified noninflammatory disorders of vulva and perineum: Secondary | ICD-10-CM | POA: Diagnosis not present

## 2015-01-09 LAB — POCT URINALYSIS DIPSTICK
Bilirubin, UA: NEGATIVE
KETONES UA: NEGATIVE
Leukocytes, UA: NEGATIVE
Nitrite, UA: NEGATIVE
PROTEIN UA: NEGATIVE
RBC UA: NEGATIVE
Urobilinogen, UA: NEGATIVE
pH, UA: 5

## 2015-01-09 NOTE — Progress Notes (Signed)
Patient ID: Krista [name], female   DOB: [birth year], 44 y.o.   MRN: [id number] 44 y.o. G18P1001 Married [place] American female here for annual exam.    Wants skin tags removed in the thigh, vulvar area.  Patient complaining of vaginal itching/irritation for 2 weeks.  She hasn't had any unusual vaginal discharge or odor. Is diabetic.  Uses Diflucan often.   Is having cycles with Mirena every month.  Lasts for 2 days but is intense.  August cycle had clotting.  Some cramping.   Hx of an ovarian cyst years ago at [place].  2 cousins with breast cancer.  1 paternal aunt with breast cancer.  Maternal grandmother with ovarian cancer.  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   PCP: [name], MD  - See PCP this month.   Patient's last menstrual period was 12/24/2014.          Sexually active: Yes.   female The current method of family planning is IUD--Mirena inserted 07/2012.    Exercising: Yes.    walking. Smoker:  no  Health Maintenance: Pap:  12-12-13 Neg:Neg HR HPV History of abnormal Pap:  Yes.  2006 colposcopy and possible LEEP procedure. Paps normal since. MMG:  02-18-14 density cat B/Neg/BiRads1:[facility].  Scheduled for January 2017. Colonoscopy:  n/a BMD:   n/a  Result  n/a TDaP:  2013 Screening Labs:  Hb today: PCP, Urine today:  1+ glucose.   reports that she has never smoked. She has never used smokeless tobacco. She reports that she does not drink alcohol or use illicit drugs.  Past Medical History  Diagnosis Date  . Hypertension   . Seasonal allergies   . GERD (gastroesophageal reflux disease)   . Anxiety   . Fibroid   . STD (sexually transmitted disease)     hx HPV  . Abnormal Pap smear of cervix 2006  . History of palpitations     PVCs - wore holter monitor   . Restless leg syndrome   . OSA (obstructive sleep apnea)     AHI during total sleep 5.9/hr and REM sleep 28.1/hr (mild OSA) 06/2010  . Pre-diabetes   . Diabetes mellitus without complication  [facility])     Past Surgical History  Procedure Laterality Date  . Colposcopy  2006  . Cervical biopsy  w/ loop electrode excision  2006    Current Outpatient Prescriptions  Medication Sig Dispense Refill  . ACCU-CHEK AVIVA PLUS test strip     . DULoxetine (CYMBALTA) 60 MG capsule Take 1 capsule by mouth daily.    Marland Kitchen esomeprazole (NEXIUM) 40 MG capsule Take 40 mg by mouth daily.    Marland Kitchen FARXIGA 5 MG TABS Take 1 tablet by mouth daily.    . GuaiFENesin (MUCINEX PO) Take by mouth as needed.    Marland Kitchen losartan-hydrochlorothiazide (HYZAAR) 100-25 MG tablet Take 1 tablet by mouth daily.    . metFORMIN (GLUCOPHAGE) 500 MG tablet Take 1 tablet by mouth daily.    . montelukast (SINGULAIR) 10 MG tablet Take 10 mg by mouth daily.    . Multiple Vitamin (MULTIVITAMIN) capsule Take 1 capsule by mouth daily.    . propranolol ER (INDERAL LA) 120 MG 24 hr capsule Take 1 capsule by mouth daily.    . SYMBICORT 160-4.5 MCG/ACT inhaler Inhale 1 puff into the lungs as needed.    . Vitamin D, Ergocalciferol, (DRISDOL) 50000 UNITS CAPS capsule Take 50,000 Units by mouth daily.     No current facility-administered medications for this  visit.    Family History  Problem Relation Age of Onset  . Hypertension Mother   . Diabetes Father   . Hypertension Father   . Ovarian cancer Maternal Grandmother   . Hypertension Maternal Grandmother   . Stroke Maternal Grandfather   . Diabetes Paternal Grandmother   . Hypertension Paternal Grandmother   . Alzheimer's disease Paternal Grandfather   . Cancer Maternal Aunt     dec bladder ca    ROS:  Pertinent items are noted in HPI.  Otherwise, a comprehensive ROS was negative.  Exam:   BP 118/78 mmHg  Pulse 88  Resp 18  Ht 5\' 5"  (1.651 m)  Wt 247 lb 3.2 oz (112.129 kg)  BMI 41.14 kg/m2  LMP 12/24/2014    General appearance: alert, cooperative and appears stated age Head: Normocephalic, without obvious abnormality, atraumatic Neck: no adenopathy, supple, symmetrical,  trachea midline and thyroid normal to inspection and palpation Lungs: clear to auscultation bilaterally Breasts: normal appearance, no masses or tenderness, Inspection negative, No nipple retraction or dimpling, No nipple discharge or bleeding, No axillary or supraclavicular adenopathy Heart: regular rate and rhythm Abdomen: soft, non-tender; bowel sounds normal; no masses,  no organomegaly Extremities: extremities normal, atraumatic, no cyanosis or edema Skin: Skin color, texture, turgor normal. No rashes or lesions Lymph nodes: Cervical, supraclavicular, and axillary nodes normal. No abnormal inguinal nodes palpated Neurologic: Grossly normal  Pelvic: External genitalia:  no lesions.  Small skin tag of vulva and 9 mm skin tag on small stalk on right medial thigh.               Urethra:  normal appearing urethra with no masses, tenderness or lesions              Bartholins and Skenes: normal                 Vagina: normal appearing vagina with normal color and discharge, no lesions              Cervix: no lesions and IUD strings seen.               Pap taken: Yes.   Bimanual Exam:  Uterus:  normal size, contour, position, consistency, mobility, non-tender              Adnexa: normal adnexa and no mass, fullness, tenderness              Rectovaginal: Yes.  .  Confirms.              Anus:  normal sphincter tone, no lesions  Chaperone was present for exam.  Assessment:   Well woman visit with normal exam. Menorrhagia with current Mirena IUD.  FH of breast and ovarian cancer but distant relatives.  Diabetes.  Vulvitis.  Likely is yeast. Skin tags.  Plan: Yearly mammogram recommended after age 53.  Recommended self breast exam.  Pap and HR HPV as above. Discussed Calcium, Vitamin D, regular exercise program including cardiovascular and weight bearing exercise. Discussed importance of weight loss and good diabetes control.  Labs performed.  Yes.  .   See orders.  Affirm.  Refills  given on medications.  No..   Return for pelvic ultrasound.  Return for vulvar biopsies - excision of vulvar skin tags.  I would  Follow up annually and prn.      After visit summary provided.

## 2015-01-09 NOTE — Patient Instructions (Signed)

## 2015-01-10 LAB — WET PREP BY MOLECULAR PROBE
Candida species: NEGATIVE
GARDNERELLA VAGINALIS: NEGATIVE
Trichomonas vaginosis: NEGATIVE

## 2015-01-12 ENCOUNTER — Other Ambulatory Visit: Payer: Self-pay | Admitting: Obstetrics and Gynecology

## 2015-01-12 MED ORDER — NYSTATIN-TRIAMCINOLONE 100000-0.1 UNIT/GM-% EX CREA: 1.0000 "application " | TOPICAL_CREAM | Freq: Two times a day (BID) | CUTANEOUS | Status: DC

## 2015-01-13 LAB — IPS PAP TEST WITH HPV

## 2015-01-15 ENCOUNTER — Telehealth: Payer: Self-pay | Admitting: Obstetrics and Gynecology

## 2015-01-15 NOTE — Telephone Encounter (Signed)
Left message to call Marion at [phone].  Plan: Yearly mammogram recommended after age 45.  Recommended self breast exam.  Pap and HR HPV as above. Discussed Calcium, Vitamin D, regular exercise program including cardiovascular and weight bearing exercise. Discussed importance of weight loss and good diabetes control.  Labs performed. Yes. . See orders. Affirm.  Refills given on medications. No..  Return for pelvic ultrasound.  Return for vulvar biopsies - excision of vulvar skin tags.  I would  Follow up annually and prn.

## 2015-01-15 NOTE — Telephone Encounter (Signed)
Reviewed benefit for Pelvic Ultrasound with patient. Patient agreeable and ready to schedule. Patient scheduled for 01/20/15 with Dr Quincy Simmonds. Patient agreeable to arrival date/time. Patient agreeable to 72 hour cancellation policy with [id number] fee. No further questions. Ok to close.

## 2015-01-15 NOTE — Telephone Encounter (Signed)
Spoke with patient and scheduled for a pelvic ultrasound earlier today. Called patient back to discuss benefits for vulvar biopsy as recommended by Dr Quincy Simmonds. Dr Quincy Simmonds prefers scheduled on a different day than patients ultrasound.  When attempting to review benefit for biopsy with patient, she stated she was unsure what the test was for and would prefer to discuss with nurse prior to scheduling. Patient agreeable to ultrasound appointment as scheduled and understands Dr Quincy Simmonds prefers to schedule biopsy separately.  Patient agreeable to return call from nurse to discuss and schedule. Patient states she is in class and requests return call after 330pm today.

## 2015-01-15 NOTE — Telephone Encounter (Signed)
Spoke with patient. Advised patient that vulvar biopsy precert was performed for removal of vulvar skin tags as she requested at [place] with Dr.Silva. Patient is agreeable. Patient would like to proceed with scheduling. Appointment scheduled for 02/16/2015 at 3 pm with Dr.Silva. Agreeable to date and time.  Routing to provider for final review. Patient agreeable to disposition. Will close encounter.

## 2015-01-20 ENCOUNTER — Encounter: Payer: Self-pay | Admitting: Obstetrics and Gynecology

## 2015-01-20 ENCOUNTER — Ambulatory Visit (INDEPENDENT_AMBULATORY_CARE_PROVIDER_SITE_OTHER): Payer: BC Managed Care – PPO | Admitting: Obstetrics and Gynecology

## 2015-01-20 ENCOUNTER — Ambulatory Visit (INDEPENDENT_AMBULATORY_CARE_PROVIDER_SITE_OTHER): Payer: BC Managed Care – PPO

## 2015-01-20 VITALS — BP 130/76 | HR 70 | Ht 65.0 in | Wt 246.0 lb

## 2015-01-20 DIAGNOSIS — N920 Excessive and frequent menstruation with regular cycle: Secondary | ICD-10-CM | POA: Diagnosis not present

## 2015-01-20 DIAGNOSIS — D259 Leiomyoma of uterus, unspecified: Secondary | ICD-10-CM | POA: Diagnosis not present

## 2015-01-20 NOTE — Progress Notes (Signed)
  Subjective  44 y.o. G11P1001 African American female here for pelvic ultrasound for bleeding with Mirena IUD.  Previous history of myoma at outside GYN office.  Patient states she is having pain that feels like labor at times, has HPV, and has completed childbearing.   Objective  Pelvic ultrasound images and report reviewed with patient.  Uterus - 5.4 cm anterior fundal fibroid, and 3.7 cm posterior fibroid. EMS - 3.80 cm.  IUD lower in the endometrial canal.  Ovaries - normal.  No masses. Free fluid - no      Assessment  Uterine myomas.  Mirena IUD patient. Somewhat displaced by IUD.  Hx HPV.  HTN.  Prediabetes.  MGM with ovarian cancer.   Plan  Discussion regarding uterine fibroids -  Natural history, symptoms, and treatment options.  Treatment options include progesterone forms of contraception, Depo Lupron short term, uterine artery embolization, myomectomy and hysterectomy.  Focused discussion regarding hysterectomy as this is the patient's preference at this time.  I have discussed laparoscopic robotic total hysterectomy with bilateral salpingectomy and cystoscopy as route of surgery.   I reviewed benefits and risks which include but are not limited to bleeding, infection, damage to surrounding organs, pneumonia, reaction to anesthesia, DVt, PE, death, and need for reoperation. Surgical procedure and recovery discussed.  ACOG handout on hysterectomy and DaVinci handout on robotic hysterectomy to patient. Patient will consider.  I also discussed the possible reduction in pregnancy prevention effectiveness of Mirena due to the low position in the uterine cavity.  I suggested back up protection with condoms.  Patient may also consider Depo Provera.  She declines removal of the IUD today.   ___25____ minutes face to face time of which over 50% was spent in counseling.   After visit summary to patient.

## 2015-01-21 ENCOUNTER — Encounter: Payer: Self-pay | Admitting: Obstetrics and Gynecology

## 2015-02-13 ENCOUNTER — Telehealth: Payer: Self-pay | Admitting: Obstetrics and Gynecology

## 2015-02-13 DIAGNOSIS — Z30432 Encounter for removal of intrauterine contraceptive device: Secondary | ICD-10-CM

## 2015-02-13 NOTE — Telephone Encounter (Signed)
I can do the IUD removal, new Rx and skin tag removals at the same visit on Monday, 1/16 at 3:00 pm.

## 2015-02-13 NOTE — Telephone Encounter (Signed)
Spoke with patient. Patient would like to move her skin tag removal appointment to later in the month due to [facility] cost. Would like to keep the appointment for Monday 02/16/2015 and have her IUD removed. States she is having irregular bleeding and is ready to try a new birth control option until she can have surgery. Appointment for 02/16/2015 at 3 pm changed to IUD removal appointment. Order placed for precert. Skin tag removal appointment rescheduled for 03/04/2015 at 3 pm with Dr.Silva. Agreeable to date and time.  Cc: [name]  Routing to provider for final review. Patient agreeable to disposition. Will close encounter.

## 2015-02-13 NOTE — Telephone Encounter (Signed)
Patient has been experiencing intermittent discomfort and irregular bleeding. Has Mirena IUD in place. Patient had an ultrasound in the office on 01/20/2015. Dr.Silva recommended using a BUM due to low position of the Mirena in the uterine cavity. Patient will be seen on 02/16/2015 for a skin tag removal. Would like to have IUD removed at this time.  Dr.Silva, okay to remove IUD at skin tag removal appointment?

## 2015-02-13 NOTE — Telephone Encounter (Signed)
Patient is scheduled for skin tag removal 02/16/15, while confirming patient's appointment; patient requested if she could also have her IUD removed as well. She says that it is giving her a lot of trouble and she and Dr. Quincy Simmonds had already discussed a possible removal. Best # to reach: [phone]

## 2015-02-16 ENCOUNTER — Telehealth: Payer: Self-pay | Admitting: Obstetrics and Gynecology

## 2015-02-16 ENCOUNTER — Ambulatory Visit: Payer: BC Managed Care – PPO | Admitting: Obstetrics and Gynecology

## 2015-02-16 NOTE — Telephone Encounter (Signed)
Call patient to discuss benefits for a procedure. Patient was originally scheduled 02/16/15 for a skin tag removal, but after talking with Kaitlyn on 02/13/15 , patient cancelled that procedure and rescheduled for same time an IUD removal. This took place at 4:30 Friday afternoon, [employer] was already closed and I was unable to verify benefits for procedure. In attempting to call today, [employer]  Is closed for the holiday and we are unable to verify benefits. Left message on voicemail to discuss.

## 2015-02-16 NOTE — Telephone Encounter (Signed)
Spoke with patient regarding benefits for IUD removal. The appointment was scheduled by [place] on Friday, 02/13/15 at 4:30. Due to not being able to verify benefits on Friday, due to [place] already being closed and Monday being a holiday, patient requested to reschedule.

## 2015-02-23 ENCOUNTER — Ambulatory Visit: Payer: BC Managed Care – PPO

## 2015-03-04 ENCOUNTER — Encounter: Payer: Self-pay | Admitting: Obstetrics and Gynecology

## 2015-03-04 ENCOUNTER — Ambulatory Visit: Payer: BC Managed Care – PPO | Admitting: Obstetrics and Gynecology

## 2015-03-04 ENCOUNTER — Telehealth: Payer: Self-pay | Admitting: Obstetrics and Gynecology

## 2015-03-04 NOTE — Telephone Encounter (Signed)
Patient Krista [name] appointment for Vulvar Biopsy today.

## 2015-03-05 NOTE — Telephone Encounter (Signed)
Called patient she said she totally forgot and she is at school right now when she gets a chance to look at her schedule she will give [place] a call back to r/s.

## 2015-03-05 NOTE — Telephone Encounter (Signed)
Thank you for the update!

## 2015-03-09 ENCOUNTER — Telehealth: Payer: Self-pay | Admitting: Obstetrics and Gynecology

## 2015-03-09 ENCOUNTER — Encounter: Payer: Self-pay | Admitting: Obstetrics and Gynecology

## 2015-03-09 NOTE — Telephone Encounter (Signed)
Pt called to to verify benefits for scheduled procedure on 03/11/15, appears she had benefits confused with benefits for procedure that was scheduled for 03/04/15. Per patients request, I will reverify benefits for 03/11/15 procedure

## 2015-03-09 NOTE — Telephone Encounter (Signed)
Patient cancelled appointment for procedure scheduled for 03/11/2015 due to work conflict she will call us back to reschedule.

## 2015-03-10 NOTE — Telephone Encounter (Signed)
Thank you for the update!

## 2015-03-11 ENCOUNTER — Ambulatory Visit: Payer: BC Managed Care – PPO | Admitting: Obstetrics and Gynecology

## 2015-04-13 ENCOUNTER — Telehealth: Payer: Self-pay | Admitting: Obstetrics and Gynecology

## 2015-04-13 NOTE — Telephone Encounter (Signed)
error 

## 2015-04-14 NOTE — Telephone Encounter (Signed)
Note opened in error.

## 2015-05-11 ENCOUNTER — Telehealth: Payer: Self-pay | Admitting: Obstetrics and Gynecology

## 2015-05-11 DIAGNOSIS — Z30432 Encounter for removal of intrauterine contraceptive device: Secondary | ICD-10-CM

## 2015-05-11 NOTE — Telephone Encounter (Signed)
Spoke with patient. Patient would like to schedule her IUD removal at this time. Appointment scheduled for 05/18/2015 at 10 am with Dr.Silva. She is agreeable to date and time. Order placed for IUD removal for precert. Aware she will be contacted with her benefit information prior to her appointment. Patient states that she would like to proceed with scheduling her surgery with Dr.Silva at this time. Advised I will send a message to our nursing manager who help facilitate surgery scheduling. Advised she will receive a phone call to schedule her surgery as well. She is agreeable.  Cc: Theresia Lo for precert Lamont Snowball, RN for surgery scheduling  Routing to provider for final review. Patient agreeable to disposition. Will close encounter.

## 2015-05-11 NOTE — Telephone Encounter (Signed)
Patient would like an appointment for iud removal. °

## 2015-05-18 ENCOUNTER — Encounter: Payer: Self-pay | Admitting: Obstetrics and Gynecology

## 2015-05-18 ENCOUNTER — Ambulatory Visit (INDEPENDENT_AMBULATORY_CARE_PROVIDER_SITE_OTHER): Payer: BC Managed Care – PPO | Admitting: Obstetrics and Gynecology

## 2015-05-18 VITALS — BP 112/68 | HR 80 | Ht 65.0 in | Wt 245.8 lb

## 2015-05-18 DIAGNOSIS — Q828 Other specified congenital malformations of skin: Secondary | ICD-10-CM | POA: Diagnosis not present

## 2015-05-18 DIAGNOSIS — D259 Leiomyoma of uterus, unspecified: Secondary | ICD-10-CM

## 2015-05-18 DIAGNOSIS — N946 Dysmenorrhea, unspecified: Secondary | ICD-10-CM

## 2015-05-18 DIAGNOSIS — Z30432 Encounter for removal of intrauterine contraceptive device: Secondary | ICD-10-CM | POA: Diagnosis not present

## 2015-05-18 MED ORDER — NORETHINDRONE 0.35 MG PO TABS: 1.0000 | ORAL_TABLET | Freq: Every day | ORAL | Status: DC

## 2015-05-18 NOTE — Progress Notes (Signed)
Patient ID: Krista [name], female   DOB: Jan 09, 1971, 45 y.o.   MRN: [id number] GYNECOLOGY  VISIT   HPI: 45 y.o.   Married  [place] American  female   [phone] with Patient's last menstrual period was 05/13/2015 (exact date).   here for Mirena IUD removal.    Wants birth control today.  Considering surgery for June 2017.  Wants all vaginal bleeding to stop and cramping to stop.   Patient states she is having pain that feels like labor at times, has HPV, and has completed childbearing.   Pelvic ultrasound 01/2015 -  Uterus - 5.4 cm anterior fundal fibroid, and 3.7 cm posterior fibroid. EMS - 3.80 cm. IUD lower in the endometrial canal.  Ovaries - normal. No masses. Free fluid - no  Does suprapubic pressure to finish voiding at times. Occurring for 4 -5 years.  Leaks urine with cough, sneeze, or laugh.  Mucinex makes patient cough, and then she leaks.  No interested currently in bladder surgery.   Wants skin tag removal at the time of hysterectomy.   GYNECOLOGIC HISTORY: Patient's last menstrual period was 05/13/2015 (exact date). Contraception:  Mirena IUD inserted 07/2012. Menopausal hormone therapy:  n/a Last mammogram:  02-18-14 density cat B/Neg/[facility].Scheduled 06-15-15 Last pap smear:12-12-13 Neg:Neg HR HPV           OB History    Gravida Para Term Preterm AB TAB SAB Ectopic Multiple Living   1 1 1       1          Patient Active Problem List   Diagnosis Date Noted  . 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 Morbid obesity ([place]) 11/22/2012  . OSA on CPAP 11/22/2012  . HTN (hypertension) 11/22/2012  . DOE (dyspnea on exertion) 11/22/2012  . Abnormal EKG 11/22/2012    Past Medical History  Diagnosis Date  . Hypertension   . Seasonal allergies   . GERD (gastroesophageal reflux disease)   . Anxiety   . Fibroid   . STD (sexually transmitted disease)     hx HPV  . Abnormal Pap smear of cervix 2006  . History of palpitations     PVCs - wore holter monitor    . Restless leg syndrome   . OSA (obstructive sleep apnea)     AHI during total sleep 5.9/hr and REM sleep 28.1/hr (mild OSA) 06/2010  . Pre-diabetes   . Diabetes mellitus without complication [facility])     Past Surgical History  Procedure Laterality Date  . Colposcopy  2006  . Cervical biopsy  w/ loop electrode excision  2006    Current Outpatient Prescriptions  Medication Sig Dispense Refill  . ACCU-CHEK AVIVA PLUS test strip     . DULoxetine (CYMBALTA) 60 MG capsule Take 1 capsule by mouth daily.    Marland Kitchen esomeprazole (NEXIUM) 40 MG capsule Take 40 mg by mouth daily.    Marland Kitchen FARXIGA 5 MG TABS Take 1 tablet by mouth daily.    . GuaiFENesin (MUCINEX PO) Take by mouth as needed.    . metFORMIN (GLUCOPHAGE) 500 MG tablet Take 1 tablet by mouth daily.    . montelukast (SINGULAIR) 10 MG tablet Take 10 mg by mouth daily.    . Multiple Vitamin (MULTIVITAMIN) capsule Take 1 capsule by mouth daily.    Marland Kitchen nystatin-triamcinolone (MYCOLOG II) cream Apply 1 application topically 2 (two) times daily. Apply to affected area BID for up to 7 days. 60 g 0  . propranolol ER (INDERAL LA) 120 MG  24 hr capsule Take 1 capsule by mouth daily.    . SYMBICORT 160-4.5 MCG/ACT inhaler Inhale 1 puff into the lungs as needed.    . Vitamin D, Ergocalciferol, (DRISDOL) 50000 UNITS CAPS capsule Take 50,000 Units by mouth daily.    Marland Kitchen losartan-hydrochlorothiazide (HYZAAR) 100-25 MG tablet Take 1 tablet by mouth daily.    Marland Kitchen omeprazole (PRILOSEC) 20 MG capsule Take 1 capsule by mouth daily.     No current facility-administered medications for this visit.     ALLERGIES: Ace inhibitors and Penicillins  Family History  Problem Relation Age of Onset  . Hypertension Mother   . Diabetes Father   . Hypertension Father   . Ovarian cancer Maternal Grandmother   . Hypertension Maternal Grandmother   . Stroke Maternal Grandfather   . Diabetes Paternal Grandmother   . Hypertension Paternal Grandmother   . Alzheimer's disease  Paternal Grandfather   . Cancer Maternal Aunt     dec bladder ca    Social History   Social History  . Marital Status: Married    Spouse Name: N/A  . Number of Children: 1  . Years of Education: 14   Occupational History  . Environmental consultant - [employer]    Social History Main Topics  . Smoking status: Never Smoker   . Smokeless tobacco: Never Used  . Alcohol Use: No  . Drug Use: No  . Sexual Activity:    Partners: Male    Birth Control/ Protection: IUD     Comment: Mirena--inserted 07/2012   Other Topics Concern  . Not on file   Social History Narrative    ROS:  Pertinent items are noted in HPI.  PHYSICAL EXAMINATION:    BP 112/68 mmHg  Pulse 80  Ht 5\' 5"  (1.651 m)  Wt 245 lb 12.8 oz (111.494 kg)  BMI 40.90 kg/m2  LMP 05/13/2015 (Exact Date)    General appearance: alert, cooperative and appears stated age   Skin:  Skin tags of right thigh and vulva.   Pelvic: External genitalia:  no lesions              Urethra:  normal appearing urethra with no masses, tenderness or lesions              Bartholins and Skenes: normal                 Vagina: normal appearing vagina with normal color and discharge, no lesions              Cervix:  No lesions.  IUD strings noted.  Removal of IUD.  Consent for procedure.  Sterile prep with Hibiclens.  Ring forceps used to remove IUD - intact and discarded.               Bimanual Exam:  Uterus:  normal size, contour, position, consistency, mobility, non-tender              Adnexa: normal adnexa and no mass, fullness, tenderness                Chaperone was present for exam.  ASSESSMENT  Uterine myomas.  Dysmenorrhea.  Cramping may be due partially due to misplaced IUD.  Mirena IUD patient. Somewhat displaced by IUD. Removed today. Hx HPV.  HTN.  Prediabetes.  MGM with ovarian cancer.  Genuine stress incontinence. Declines tx.  Skin tags.   PLAN  Micronor 1 month and 2 refills.  Re-instructed in use.  Use back up  contraception. I discussed robotic hysterectomy with bilateral salpingectomy and cystoscopy, and removal of skin tags.  I reviewed  Risks, benefits, and alternatives.  Risks include but are not limited to bleeding, infection, damage to surrounding organs, pneumonia, reaction to anesthesia, DVT, PE, death, need for reoperation, hernia formation, need to convert to a traditional laparotomy incision to complete the procedure. Vaginal morcellation of uterus also discussed.  Patient would like to proceed with precert and scheduling.  She declines further evaluation of urinary incontinence and surgical treatment of this.  I have encouraged weight loss.   An After Visit Summary was printed and given to the patient.  _25_____ minutes face to face time of which over 50% was spent in counseling.

## 2015-05-26 ENCOUNTER — Telehealth: Payer: Self-pay | Admitting: Obstetrics and Gynecology

## 2015-05-26 NOTE — Telephone Encounter (Signed)
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 Patient states she will call back when her work day is over

## 2015-05-28 NOTE — Telephone Encounter (Signed)
Spoke with patient regarding benefits for recommended procedure, she is agreeable to benefits. Patient requests to contact the [facility] prior to scheduling. Pt will call back to schedule after speaking with [place]

## 2015-06-04 NOTE — Telephone Encounter (Signed)
Called patient to follow up regarding recommended procedure. Left message for patient to return call

## 2015-06-09 NOTE — Telephone Encounter (Signed)
Returned patients call. Patient had additional questions which were addressed with Seth Bake and Gay Filler. Patient advised she will call back on 06/10/15 to provide surgery deposit

## 2015-06-09 NOTE — Telephone Encounter (Signed)
Patient returned call

## 2015-06-15 ENCOUNTER — Ambulatory Visit: Payer: BC Managed Care – PPO

## 2015-06-15 ENCOUNTER — Inpatient Hospital Stay: Admission: RE | Admit: 2015-06-15 | Payer: BC Managed Care – PPO | Source: Ambulatory Visit

## 2015-06-16 NOTE — Telephone Encounter (Signed)
Patient ready to schedule surgery. Patient provided surgery deposit over the phone. Patient aware this is professional benefit only. Patient aware will be contacted by hospital for separate benefits. Staff message to [facility] for scheduling

## 2015-06-16 NOTE — Telephone Encounter (Signed)
Patient ready to schedule surgery. Best # to reach: [phone]

## 2015-06-18 ENCOUNTER — Telehealth: Payer: Self-pay | Admitting: Obstetrics and Gynecology

## 2015-06-18 NOTE — Telephone Encounter (Signed)
Surgery date of 07-21-15 at 0730 at [facility] is confirmed. Call to patient. Left message to call back.

## 2015-06-18 NOTE — Telephone Encounter (Signed)
Return call to patient. Surgery date options discussed. 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 Discussed first available of June 20. After considering several other options, patient decided to proceed with 07-21-15.   Will call her back once confirmed.

## 2015-06-18 NOTE — Telephone Encounter (Signed)
Patient is ready to schedule surgery

## 2015-06-18 NOTE — Telephone Encounter (Signed)
Patient returned call. Surgery date confirmed and patient agreeable. Surgery instruction sheet reviewed and printed copy mailed to patient. See copy scanned to chart.  Anesthesia consult requested due to history of sleep apnea.    Routing to provider for final review. Patient agreeable to disposition. Will close encounter.

## 2015-06-22 ENCOUNTER — Telehealth: Payer: Self-pay | Admitting: Obstetrics and Gynecology

## 2015-06-22 ENCOUNTER — Telehealth: Payer: Self-pay | Admitting: *Deleted

## 2015-06-22 NOTE — Telephone Encounter (Signed)
Call to patient. Advised of Dr Elza Rafter instructions for PCP appointment for surgical clearance and HgbA1C prior to surgery. We request office note or letter stating cleared for surgery along with copy of labs. Patient states she will call now. Advised to call me back if needs any assistance.  Routing to provider for final review. Patient agreeable to disposition. Will close encounter.

## 2015-06-22 NOTE — Telephone Encounter (Signed)
Call to patient. Advised of new appointment date and time. Advised that Dr Quincy Simmonds may do lab work at appointment here on 07-01-15 and send results to PCP as a way to facilitate earlier results.  Advised Dr Quincy Simmonds will review this call and will call her back if any changes.  Routing to provider for final review. Patient agreeable to disposition. Will close encounter.

## 2015-06-22 NOTE — Telephone Encounter (Signed)
Call to PCP office, spoke with [place]. Appointment moved up to 07-06-15 at 3pm.

## 2015-06-22 NOTE — Telephone Encounter (Signed)
Patient called requesting to speak with the surgery coordinator to confirm her pre-op appointment with her PCP, Dr. Rica Records, on 07/15/15 at 8:30 is soon enough to coordinate with her surgery with Dr. Quincy Simmonds.

## 2015-06-30 ENCOUNTER — Ambulatory Visit
Admission: RE | Admit: 2015-06-30 | Discharge: 2015-06-30 | Disposition: A | Discharge status: Home / Self Care | Payer: BC Managed Care – PPO | Source: Ambulatory Visit

## 2015-06-30 ENCOUNTER — Ambulatory Visit: Payer: BC Managed Care – PPO

## 2015-06-30 DIAGNOSIS — Z1231 Encounter for screening mammogram for malignant neoplasm of breast: Secondary | ICD-10-CM

## 2015-07-01 ENCOUNTER — Encounter: Payer: Self-pay | Admitting: Obstetrics and Gynecology

## 2015-07-01 ENCOUNTER — Ambulatory Visit (INDEPENDENT_AMBULATORY_CARE_PROVIDER_SITE_OTHER): Payer: BC Managed Care – PPO | Admitting: Obstetrics and Gynecology

## 2015-07-01 VITALS — BP 110/76 | HR 70 | Resp 16 | Ht 65.0 in | Wt 244.0 lb

## 2015-07-01 DIAGNOSIS — N760 Acute vaginitis: Secondary | ICD-10-CM | POA: Diagnosis not present

## 2015-07-01 DIAGNOSIS — D259 Leiomyoma of uterus, unspecified: Secondary | ICD-10-CM

## 2015-07-01 MED ORDER — FLUCONAZOLE 150 MG PO TABS: 150.0000 mg | ORAL_TABLET | Freq: Once | ORAL | Status: DC

## 2015-07-01 NOTE — Progress Notes (Signed)
GYNECOLOGY  VISIT   HPI: 45 y.o.   Married  [place] American  female   [phone] with Patient's last menstrual period was 06/19/2015.   here for  Surgical consult & vaginal itching/vulvar itching.    Itching is mostly vulvar. No odor or discharge. Using Mycolog II but it is not enough.  Desires hysterectomy and declines future childbearing.  Has known fibroids and a Mirena IUD that was low in the endometrial canal.  Mirena IUD removed in April 2017 due to bleeding and pain.  Menses heavy with clots after IUD removal.   Intercourse is more comfortable now.  Not having contraction like pain any more.  After Mirena IUD was removed, started Micronor. Missed a dose this week and one last week. Using back up protection.   Pelvic ultrasound 01/2015 -  Uterus - 5.4 cm anterior fundal fibroid, and 3.7 cm posterior fibroid. EMS - 3.80 cm. IUD lower in the endometrial canal.  Ovaries - normal. No masses. Free fluid - no  Does suprapubic pressure to finish voiding at times. Occurring for 4 -5 years.  Leaks urine with cough, sneeze, or laugh.  Mucinex makes patient cough, and then she leaks.  No interested currently in bladder surgery.   Will see PCP July 06, 2015 for medical clearance for surgery. Has anesthesia pre-op on July 17, 2015.  States she can take NSAIDs.  States respiratory issues are allergy related, not really asthma.  GYNECOLOGIC HISTORY: Patient's last menstrual period was 06/19/2015. Contraception:  micronor Menopausal hormone therapy:  none Last mammogram:  02-17-14 category b density,birads 1:neg Last pap smear:   01-09-15 ASCUS HPV HR neg        OB History    Gravida Para Term Preterm AB TAB SAB Ectopic Multiple Living   1 1 1       1          Patient Active Problem List   Diagnosis Date Noted  . 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 Morbid obesity ([place]) 11/22/2012  . OSA on CPAP 11/22/2012  . HTN (hypertension) 11/22/2012  . DOE (dyspnea on exertion)  11/22/2012  . Abnormal EKG 11/22/2012    Past Medical History  Diagnosis Date  . Hypertension   . Seasonal allergies   . GERD (gastroesophageal reflux disease)   . Anxiety   . Fibroid   . STD (sexually transmitted disease)     hx HPV  . Abnormal Pap smear of cervix 2006  . History of palpitations     PVCs - wore holter monitor   . Restless leg syndrome   . OSA (obstructive sleep apnea)     AHI during total sleep 5.9/hr and REM sleep 28.1/hr (mild OSA) 06/2010  . Pre-diabetes   . Diabetes mellitus without complication [facility])     Past Surgical History  Procedure Laterality Date  . Colposcopy  2006  . Cervical biopsy  w/ loop electrode excision  2006    Current Outpatient Prescriptions  Medication Sig Dispense Refill  . ACCU-CHEK AVIVA PLUS test strip     . DULoxetine (CYMBALTA) 60 MG capsule Take 1 capsule by mouth daily.    Marland Kitchen esomeprazole (NEXIUM) 40 MG capsule Take 40 mg by mouth daily.    Marland Kitchen FARXIGA 5 MG TABS Take 1 tablet by mouth daily.    . GuaiFENesin (MUCINEX PO) Take by mouth as needed.    Marland Kitchen losartan-hydrochlorothiazide (HYZAAR) 100-25 MG tablet Take 1 tablet by mouth daily.    . metFORMIN (GLUCOPHAGE) 500 MG  tablet Take 1 tablet by mouth daily.    . montelukast (SINGULAIR) 10 MG tablet Take 10 mg by mouth daily.    . Multiple Vitamin (MULTIVITAMIN) capsule Take 1 capsule by mouth daily.    . norethindrone (MICRONOR,CAMILA,ERRIN) 0.35 MG tablet Take 1 tablet (0.35 mg total) by mouth daily. 1 Package 2  . nystatin-triamcinolone (MYCOLOG II) cream Apply 1 application topically 2 (two) times daily. Apply to affected area BID for up to 7 days. 60 g 0  . omeprazole (PRILOSEC) 20 MG capsule Take 1 capsule by mouth daily.    [name] DELICA LANCETS [id number] MISC     . propranolol ER (INDERAL LA) 120 MG 24 hr capsule Take 1 capsule by mouth daily.    . SYMBICORT 160-4.5 MCG/ACT inhaler Inhale 1 puff into the lungs as needed.    . Vitamin D, Ergocalciferol, (DRISDOL) 50000 UNITS  CAPS capsule Take 50,000 Units by mouth daily.     No current facility-administered medications for this visit.     ALLERGIES: Ace inhibitors and Penicillins  Family History  Problem Relation Age of Onset  . Hypertension Mother   . Diabetes Father   . Hypertension Father   . Ovarian cancer Maternal Grandmother   . Hypertension Maternal Grandmother   . Stroke Maternal Grandfather   . Diabetes Paternal Grandmother   . Hypertension Paternal Grandmother   . Alzheimer's disease Paternal Grandfather   . Cancer Maternal Aunt     dec bladder ca    Social History   Social History  . Marital Status: Married    Spouse Name: N/A  . Number of Children: 1  . Years of Education: 14   Occupational History  . Environmental consultant - [employer]    Social History Main Topics  . Smoking status: Never Smoker   . Smokeless tobacco: Never Used  . Alcohol Use: No  . Drug Use: No  . Sexual Activity:    Partners: Male    Birth Control/ Protection: OCP   Other Topics Concern  . Not on file   Social History Narrative    ROS:  Pertinent items are noted in HPI.  PHYSICAL EXAMINATION:    BP 110/76 mmHg  Pulse 70  Resp 16  Ht 5\' 5"  (1.651 m)  Wt 244 lb (110.678 kg)  BMI 40.60 kg/m2  LMP 06/19/2015    General appearance: alert, cooperative and appears stated age Head: Normocephalic, without obvious abnormality, atraumatic Neck: no adenopathy, supple, symmetrical, trachea midline and thyroid normal to inspection and palpation Lungs: clear to auscultation bilaterally Heart: regular rate and rhythm Abdomen: obese, incision(s):No. ,   soft, non-tender, no masses,  no organomegaly Extremities: extremities normal, atraumatic, no cyanosis or edema Skin: Skin color, texture, turgor normal. No rashes or lesions Lymph nodes: Cervical, supraclavicular, and axillary nodes normal. No abnormal inguinal nodes palpated Neurologic: Grossly normal  Pelvic: External genitalia:   Erythema of the labia noted  bilaterally.               Urethra:  normal appearing urethra with no masses, tenderness or lesions              Bartholins and Skenes: normal                 Vagina: normal appearing vagina with normal color and discharge, no lesions              Cervix: no lesions  Bimanual Exam:  Uterus:  normal size, contour, position, consistency, mobility, non-tender              Adnexa: normal adnexa and no mass, fullness, tenderness           Chaperone was present for exam.  ASSESSMENT  Fibroid uterus.  Heavy cycles. Hx of cervical dysplasia.  Hx ovarian cancer in material grandmother.  Vulvitis.  Likely yeast.  DM.  HTN.  GERD.  Sleep apnea.  PLAN  I discussed robotic hysterectomy with bilateral salpingectomy and oophorectomy only if ovaries are abnormal.   I reviewed  Risks, benefits, and alternatives.  Risks include but are not limited to bleeding, infection, damage to surrounding organs, pneumonia, reaction to anesthesia, DVT, PE, death, need for reoperation, hernia formation, vaginal cuff dehiscence, continued pain, need to convert to a traditional laparotomy incision to complete the procedure, and menopausal symptoms if ovaries are removed.  Menopausal symptoms can be treated with hormonal medication including estrogen and testosterone if needed.  Rare risk of sarcoma discussed with the patient ad the risk of morcellation to worsen prognosis and survivorship. She understands I may need to do vaginal morcellation for removal of the uterine specimen.  Surgical expectations and recovery discussed.   Patient wishes to proceed with surgery.   Complete medical preop and anesthesia preop visits.    Affirm testing sent.   Diflucan 150 mg po x 1.  Repeat if needed in 72 hours.   __25_____ minutes face to face time of which over 50% was spent in counseling.     An After Visit Summary was printed and given to the patient.

## 2015-07-03 ENCOUNTER — Other Ambulatory Visit: Payer: Self-pay | Admitting: Obstetrics and Gynecology

## 2015-07-03 ENCOUNTER — Telehealth: Payer: Self-pay | Admitting: Emergency Medicine

## 2015-07-03 DIAGNOSIS — R928 Other abnormal and inconclusive findings on diagnostic imaging of breast: Secondary | ICD-10-CM

## 2015-07-03 LAB — WET PREP BY MOLECULAR PROBE
Candida species: POSITIVE — AB
Gardnerella vaginalis: POSITIVE — AB
Trichomonas vaginosis: NEGATIVE

## 2015-07-03 MED ORDER — METRONIDAZOLE 500 MG PO TABS: 500.0000 mg | ORAL_TABLET | Freq: Two times a day (BID) | ORAL | Status: DC

## 2015-07-03 NOTE — Telephone Encounter (Signed)
-----   Message from [name], MD sent at 07/03/2015  1:09 PM EDT ----- Please contact patient regarding her Affirm testing.  She has both yeast and BV. I did give her Diflucan at her office visit.  She also needs to take Flagyl 500 mg po bid for 7 days.  She needs to do this in preparation for her hysterectomy.  Please take it starting this weekend. Please send to her pharmacy of choice.   Cc- Marisa Sprinkles

## 2015-07-03 NOTE — Telephone Encounter (Signed)
Patient notified of message from Dr. Quincy Simmonds. She is given results and instructions per message.  Instructions given regarding Flagyl. Do not mix with alcohol. If mixed with alcohol can cause severe nausea, vomiting and severe abdominal cramping. Avoid alcohol for 72 hours prior to starting medication, during treatment and 72 hours after completing medication. Normal side effects of flagyl may include a metallic taste in the mouth, slight nausea, headache, abdominal cramping, diarrhea and dizziness.   Patient verbalized understanding of instructions and will follow up as scheduled. Prescription sent to [facility].  Patient advised to call back with any concerns or questions and she agrees to start prescription this weekend.  Routing to provider for final review. Patient agreeable to disposition. Will close encounter.

## 2015-07-13 ENCOUNTER — Ambulatory Visit
Admission: RE | Admit: 2015-07-13 | Discharge: 2015-07-13 | Disposition: A | Discharge status: Home / Self Care | Payer: BC Managed Care – PPO | Source: Ambulatory Visit | Attending: Obstetrics and Gynecology | Admitting: Obstetrics and Gynecology

## 2015-07-13 DIAGNOSIS — R928 Other abnormal and inconclusive findings on diagnostic imaging of breast: Secondary | ICD-10-CM

## 2015-07-15 ENCOUNTER — Telehealth: Payer: Self-pay | Admitting: Obstetrics and Gynecology

## 2015-07-15 NOTE — Telephone Encounter (Signed)
Spoke with patient and she states she has "lost" her surgery instruction sheet.  She would like me to fax this to her PCP in [place], Dr.Nina Uppin at fax [phone], since patient is in [place].  I offered to read these by phone but she states in her car and really wants them faxed to PCP so she'll have a copy.  Her PCP said she could pick up instructions at their office.  Faxed instructions to Dr. Rica Records in [place], [phone].

## 2015-07-15 NOTE — Telephone Encounter (Signed)
Patient has "lost" her surgery instruction sheet and is requesting to review the instructions over the phone.

## 2015-07-16 NOTE — Patient Instructions (Addendum)
Your procedure is scheduled on: Tuesday, June 20  Enter through the [employer] [facility] at: [employer] up the phone at the desk and dial [phone].  Call this number if you have problems the morning of surgery: [phone].  Remember: Do NOT eat or drink after midnight Monday, 6/19  Take these medicines the morning of surgery with a SIP OF WATER: allegra, losartan-hctz, omeprazole, propranolol, ok to use inhaler if needed.  Do not take any diabetes medications on the day of surgery.  We will check your blood sugar and treat if needed.  Bring inhaler with you on day of surgery.  Do NOT wear jewelry (body piercing), metal hair clips/bobby pins, make-up, or nail polish. Do NOT wear lotions, powders, or perfumes.  You may wear deoderant. Do NOT shave for 48 hours prior to surgery. Do NOT bring valuables to the hospital. Contacts may not be worn into surgery.  Leave suitcase in car.  After surgery it may be brought to your room.  For patients admitted to the hospital, checkout time is 11:00 AM the day of discharge. Home with husband Nicole [employer] cell [phone].

## 2015-07-17 ENCOUNTER — Encounter (HOSPITAL_COMMUNITY): Payer: Self-pay

## 2015-07-17 ENCOUNTER — Encounter (HOSPITAL_COMMUNITY)
Admission: RE | Admit: 2015-07-17 | Discharge: 2015-07-17 | Disposition: A | Discharge status: Home / Self Care | Payer: BC Managed Care – PPO | Source: Ambulatory Visit | Attending: Obstetrics and Gynecology | Admitting: Obstetrics and Gynecology

## 2015-07-17 ENCOUNTER — Other Ambulatory Visit: Payer: Self-pay

## 2015-07-17 ENCOUNTER — Other Ambulatory Visit: Payer: BC Managed Care – PPO

## 2015-07-17 DIAGNOSIS — Z01810 Encounter for preprocedural cardiovascular examination: Secondary | ICD-10-CM | POA: Diagnosis present

## 2015-07-17 DIAGNOSIS — Z01812 Encounter for preprocedural laboratory examination: Secondary | ICD-10-CM | POA: Insufficient documentation

## 2015-07-17 LAB — CBC
HEMATOCRIT: 37.5 % (ref 36.0–46.0)
Hemoglobin: 12.2 g/dL (ref 12.0–15.0)
MCH: 26.6 pg (ref 26.0–34.0)
MCHC: 32.5 g/dL (ref 30.0–36.0)
MCV: 81.7 fL (ref 78.0–100.0)
Platelets: 305 10*3/uL (ref 150–400)
RBC: 4.59 MIL/uL (ref 3.87–5.11)
RDW: 14.8 % (ref 11.5–15.5)
WBC: 6.8 10*3/uL (ref 4.0–10.5)

## 2015-07-17 LAB — BASIC METABOLIC PANEL
ANION GAP: 7 (ref 5–15)
BUN: 8 mg/dL (ref 6–20)
CHLORIDE: 98 mmol/L — AB (ref 101–111)
CO2: 30 mmol/L (ref 22–32)
Calcium: 8.7 mg/dL — ABNORMAL LOW (ref 8.9–10.3)
Creatinine, Ser: 0.76 mg/dL (ref 0.44–1.00)
Glucose, Bld: 144 mg/dL — ABNORMAL HIGH (ref 65–99)
POTASSIUM: 3.1 mmol/L — AB (ref 3.5–5.1)
SODIUM: 135 mmol/L (ref 135–145)

## 2015-07-17 NOTE — Pre-Procedure Instructions (Signed)
Reviewed medical history, medications and EKG with Dr. Delma Post.  No orders given.  [place] for surgery.

## 2015-07-20 ENCOUNTER — Encounter (HOSPITAL_COMMUNITY): Payer: Self-pay | Admitting: Anesthesiology

## 2015-07-20 MED ORDER — METRONIDAZOLE IN NACL 5-0.79 MG/ML-% IV SOLN
500.0000 mg | INTRAVENOUS | Status: AC
  Administered 2015-07-21: 400 mg via INTRAVENOUS
  Filled 2015-07-20: qty 100

## 2015-07-20 MED ORDER — CIPROFLOXACIN IN D5W 400 MG/200ML IV SOLN
400.0000 mg | INTRAVENOUS | Status: AC
  Administered 2015-07-21: 400 mg via INTRAVENOUS
  Filled 2015-07-20: qty 200

## 2015-07-20 NOTE — H&P (Signed)
Progress Notes      [name], MD at 07/01/2015 4:11 PM     Status: Signed       Expand All Collapse All   GYNECOLOGY VISIT  HPI: 45 y.o. Married [place] American female  [phone] with Patient's last menstrual period was 06/19/2015.  here for Surgical consult & vaginal itching/vulvar itching.   Itching is mostly vulvar. No odor or discharge. Using Mycolog II but it is not enough.  Desires hysterectomy and declines future childbearing.  Has known fibroids and a Mirena IUD that was low in the endometrial canal.  Mirena IUD removed in April 2017 due to bleeding and pain.  Menses heavy with clots after IUD removal.  Intercourse is more comfortable now.  Not having contraction like pain any more.  After Mirena IUD was removed, started Micronor. Missed a dose this week and one last week. Using back up protection.   Pelvic ultrasound 01/2015 -  Uterus - 5.4 cm anterior fundal fibroid, and 3.7 cm posterior fibroid. EMS - 3.80 cm. IUD lower in the endometrial canal.  Ovaries - normal. No masses. Free fluid - no  Does suprapubic pressure to finish voiding at times. Occurring for 4 -5 years.  Leaks urine with cough, sneeze, or laugh.  Mucinex makes patient cough, and then she leaks.  No interested currently in bladder surgery.   Will see PCP July 06, 2015 for medical clearance for surgery. Has anesthesia pre-op on July 17, 2015.  States she can take NSAIDs.  States respiratory issues are allergy related, not really asthma.  GYNECOLOGIC HISTORY: Patient's last menstrual period was 06/19/2015. Contraception: micronor Menopausal hormone therapy: none Last mammogram: 02-17-14 category b density,birads 1:neg Last pap smear: 01-09-15 ASCUS HPV HR neg   OB History    Gravida Para Term Preterm AB TAB SAB Ectopic Multiple Living   1 1 1       1        Patient Active Problem List   Diagnosis Date  Noted  . 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 Morbid obesity ([place]) 11/22/2012  . OSA on CPAP 11/22/2012  . HTN (hypertension) 11/22/2012  . DOE (dyspnea on exertion) 11/22/2012  . Abnormal EKG 11/22/2012    Past Medical History  Diagnosis Date  . Hypertension   . Seasonal allergies   . GERD (gastroesophageal reflux disease)   . Anxiety   . Fibroid   . STD (sexually transmitted disease)     hx HPV  . Abnormal Pap smear of cervix 2006  . History of palpitations     PVCs - wore holter monitor   . Restless leg syndrome   . OSA (obstructive sleep apnea)     AHI during total sleep 5.9/hr and REM sleep 28.1/hr (mild OSA) 06/2010  . Pre-diabetes   . Diabetes mellitus without complication [facility])     Past Surgical History  Procedure Laterality Date  . Colposcopy  2006  . Cervical biopsy w/ loop electrode excision  2006    Current Outpatient Prescriptions  Medication Sig Dispense Refill  . ACCU-CHEK AVIVA PLUS test strip     . DULoxetine (CYMBALTA) 60 MG capsule Take 1 capsule by mouth daily.    Marland Kitchen esomeprazole (NEXIUM) 40 MG capsule Take 40 mg by mouth daily.    Marland Kitchen FARXIGA 5 MG TABS Take 1 tablet by mouth daily.    . GuaiFENesin (MUCINEX PO) Take by mouth as needed.    Marland Kitchen losartan-hydrochlorothiazide (HYZAAR) 100-25 MG tablet Take 1 tablet  by mouth daily.    . metFORMIN (GLUCOPHAGE) 500 MG tablet Take 1 tablet by mouth daily.    . montelukast (SINGULAIR) 10 MG tablet Take 10 mg by mouth daily.    . Multiple Vitamin (MULTIVITAMIN) capsule Take 1 capsule by mouth daily.    . norethindrone (MICRONOR,CAMILA,ERRIN) 0.35 MG tablet Take 1 tablet (0.35 mg total) by mouth daily. 1 Package 2  . nystatin-triamcinolone (MYCOLOG II) cream Apply 1 application topically 2 (two) times daily. Apply to affected area BID for up to 7 days. 60 g 0  . omeprazole  (PRILOSEC) 20 MG capsule Take 1 capsule by mouth daily.    [name] DELICA LANCETS [id number] MISC     . propranolol ER (INDERAL LA) 120 MG 24 hr capsule Take 1 capsule by mouth daily.    . SYMBICORT 160-4.5 MCG/ACT inhaler Inhale 1 puff into the lungs as needed.    . Vitamin D, Ergocalciferol, (DRISDOL) 50000 UNITS CAPS capsule Take 50,000 Units by mouth daily.     No current facility-administered medications for this visit.     ALLERGIES: Ace inhibitors and Penicillins  Family History  Problem Relation Age of Onset  . Hypertension Mother   . Diabetes Father   . Hypertension Father   . Ovarian cancer Maternal Grandmother   . Hypertension Maternal Grandmother   . Stroke Maternal Grandfather   . Diabetes Paternal Grandmother   . Hypertension Paternal Grandmother   . Alzheimer's disease Paternal Grandfather   . Cancer Maternal Aunt     dec bladder ca    Social History   Social History  . Marital Status: Married    Spouse Name: N/A  . Number of Children: 1  . Years of Education: 14   Occupational History  . Environmental consultant - [employer]    Social History Main Topics  . Smoking status: Never Smoker   . Smokeless tobacco: Never Used  . Alcohol Use: No  . Drug Use: No  . Sexual Activity:    Partners: Male    Birth Control/ Protection: OCP   Other Topics Concern  . Not on file   Social History Narrative    ROS: Pertinent items are noted in HPI.  PHYSICAL EXAMINATION:   BP 110/76 mmHg  Pulse 70  Resp 16  Ht 5\' 5"  (1.651 m)  Wt 244 lb (110.678 kg)  BMI 40.60 kg/m2  LMP 06/19/2015  General appearance: alert, cooperative and appears stated age Head: Normocephalic, without obvious abnormality, atraumatic Neck: no adenopathy, supple, symmetrical, trachea midline and thyroid normal to inspection and palpation Lungs: clear to auscultation  bilaterally Heart: regular rate and rhythm Abdomen: obese, incision(s):No. , soft, non-tender, no masses, no organomegaly Extremities: extremities normal, atraumatic, no cyanosis or edema Skin: Skin color, texture, turgor normal. No rashes or lesions Lymph nodes: Cervical, supraclavicular, and axillary nodes normal. No abnormal inguinal nodes palpated Neurologic: Grossly normal  Pelvic: External genitalia: Erythema of the labia noted bilaterally.   Urethra: normal appearing urethra with no masses, tenderness or lesions  Bartholins and Skenes: normal   Vagina: normal appearing vagina with normal color and discharge, no lesions  Cervix: no lesions   Bimanual Exam: Uterus: normal size, contour, position, consistency, mobility, non-tender  Adnexa: normal adnexa and no mass, fullness, tenderness    Chaperone was present for exam.  ASSESSMENT  Fibroid uterus.  Heavy cycles. Hx of cervical dysplasia.  Hx ovarian cancer in material grandmother.  Vulvitis. Likely yeast.  DM.  HTN.  GERD.  Sleep  apnea.  PLAN  I discussed robotic hysterectomy with bilateral salpingectomy and oophorectomy only if ovaries are abnormal. I reviewed Risks, benefits, and alternatives. Risks include but are not limited to bleeding, infection, damage to surrounding organs, pneumonia, reaction to anesthesia, DVT, PE, death, need for reoperation, hernia formation, vaginal cuff dehiscence, continued pain, need to convert to a traditional laparotomy incision to complete the procedure, and menopausal symptoms if ovaries are removed. Menopausal symptoms can be treated with hormonal medication including estrogen and testosterone if needed. Rare risk of sarcoma discussed with the patient ad the risk of morcellation to worsen prognosis and survivorship. She understands I may need to do vaginal morcellation for removal of  the uterine specimen.  Surgical expectations and recovery discussed.   Patient wishes to proceed with surgery.   Complete medical preop and anesthesia preop visits.   Affirm testing sent.   Diflucan 150 mg po x 1. Repeat if needed in 72 hours.   __25_____ minutes face to face time of which over 50% was spent in counseling.    An After Visit Summary was printed and given to the patient.

## 2015-07-20 NOTE — Anesthesia Preprocedure Evaluation (Addendum)
Anesthesia Evaluation  Patient identified by MRN, date of birth, ID band Patient awake    Reviewed: Allergy & Precautions, NPO status , Patient's Chart, lab work & pertinent test results, reviewed documented beta blocker date and time   Airway Mallampati: II  TM Distance: >3 FB Neck ROM: Full    Dental no notable dental hx.    Pulmonary shortness of breath and with exertion, sleep apnea and Continuous Positive Airway Pressure Ventilation ,    Pulmonary exam normal breath sounds clear to auscultation       Cardiovascular hypertension, Pt. on medications and Pt. on home beta blockers + DOE  Normal cardiovascular exam Rhythm:Regular Rate:Normal  12 lead EKG NSR w/ non specific ST-T changes Minimal voltage criteria for LVH   Neuro/Psych Anxiety negative neurological ROS     GI/Hepatic Neg liver ROS, GERD  Controlled and Medicated,  Endo/Other  diabetes, Well Controlled, Type 2, Oral Hypoglycemic AgentsMorbid obesity  Renal/GU negative Renal ROS  negative genitourinary   Musculoskeletal negative musculoskeletal ROS (+)   Abdominal   Peds  Hematology negative hematology ROS (+)   Anesthesia Other Findings   Reproductive/Obstetrics Uterine Fibroids Dysmenorrhea                           Anesthesia Physical Anesthesia Plan  ASA: III  Anesthesia Plan: General   Post-op Pain Management:    Induction: Intravenous  Airway Management Planned: Oral ETT  Additional Equipment:   Intra-op Plan:   Post-operative Plan: Extubation in OR  Informed Consent: I have reviewed the patients History and Physical, chart, labs and discussed the procedure including the risks, benefits and alternatives for the proposed anesthesia with the patient or authorized representative who has indicated his/her understanding and acceptance.   Dental advisory given  Plan Discussed with: CRNA, Anesthesiologist and  Surgeon  Anesthesia Plan Comments:         Anesthesia Quick Evaluation

## 2015-07-21 ENCOUNTER — Encounter (HOSPITAL_COMMUNITY): Payer: Self-pay | Admitting: *Deleted

## 2015-07-21 ENCOUNTER — Observation Stay (HOSPITAL_COMMUNITY)
Admission: RE | Admit: 2015-07-21 | Discharge: 2015-07-22 | Disposition: A | Discharge status: Home / Self Care | Payer: BC Managed Care – PPO | Source: Ambulatory Visit | Attending: Obstetrics and Gynecology | Admitting: Obstetrics and Gynecology

## 2015-07-21 ENCOUNTER — Encounter (HOSPITAL_COMMUNITY)
Admission: RE | Disposition: A | Discharge status: Home / Self Care | Payer: Self-pay | Source: Ambulatory Visit | Attending: Obstetrics and Gynecology

## 2015-07-21 ENCOUNTER — Ambulatory Visit (HOSPITAL_COMMUNITY): Payer: BC Managed Care – PPO | Admitting: Anesthesiology

## 2015-07-21 DIAGNOSIS — I10 Essential (primary) hypertension: Secondary | ICD-10-CM | POA: Diagnosis not present

## 2015-07-21 DIAGNOSIS — Z79899 Other long term (current) drug therapy: Secondary | ICD-10-CM | POA: Diagnosis not present

## 2015-07-21 DIAGNOSIS — Z6841 Body Mass Index (BMI) 40.0 and over, adult: Secondary | ICD-10-CM | POA: Diagnosis not present

## 2015-07-21 DIAGNOSIS — G4733 Obstructive sleep apnea (adult) (pediatric): Secondary | ICD-10-CM | POA: Diagnosis not present

## 2015-07-21 DIAGNOSIS — N898 Other specified noninflammatory disorders of vagina: Secondary | ICD-10-CM | POA: Diagnosis present

## 2015-07-21 DIAGNOSIS — G4730 Sleep apnea, unspecified: Secondary | ICD-10-CM | POA: Insufficient documentation

## 2015-07-21 DIAGNOSIS — Z90710 Acquired absence of both cervix and uterus: Secondary | ICD-10-CM

## 2015-07-21 DIAGNOSIS — D251 Intramural leiomyoma of uterus: Secondary | ICD-10-CM | POA: Diagnosis not present

## 2015-07-21 DIAGNOSIS — Z880 Allergy status to penicillin: Secondary | ICD-10-CM | POA: Diagnosis not present

## 2015-07-21 DIAGNOSIS — Z7984 Long term (current) use of oral hypoglycemic drugs: Secondary | ICD-10-CM | POA: Diagnosis not present

## 2015-07-21 DIAGNOSIS — E119 Type 2 diabetes mellitus without complications: Secondary | ICD-10-CM | POA: Diagnosis not present

## 2015-07-21 DIAGNOSIS — K219 Gastro-esophageal reflux disease without esophagitis: Secondary | ICD-10-CM | POA: Diagnosis not present

## 2015-07-21 DIAGNOSIS — N920 Excessive and frequent menstruation with regular cycle: Secondary | ICD-10-CM

## 2015-07-21 DIAGNOSIS — S3141XA Laceration without foreign body of vagina and vulva, initial encounter: Secondary | ICD-10-CM | POA: Insufficient documentation

## 2015-07-21 DIAGNOSIS — N946 Dysmenorrhea, unspecified: Secondary | ICD-10-CM | POA: Insufficient documentation

## 2015-07-21 DIAGNOSIS — D259 Leiomyoma of uterus, unspecified: Secondary | ICD-10-CM | POA: Diagnosis not present

## 2015-07-21 DIAGNOSIS — D252 Subserosal leiomyoma of uterus: Secondary | ICD-10-CM | POA: Insufficient documentation

## 2015-07-21 DIAGNOSIS — N762 Acute vulvitis: Secondary | ICD-10-CM | POA: Diagnosis not present

## 2015-07-21 HISTORY — DX: Acquired absence of both cervix and uterus: Z90.710

## 2015-07-21 HISTORY — PX: ROBOTIC ASSISTED TOTAL HYSTERECTOMY WITH SALPINGECTOMY: SHX6679

## 2015-07-21 HISTORY — PX: CYSTOSCOPY: SHX5120

## 2015-07-21 LAB — POTASSIUM: POTASSIUM: 3.2 mmol/L — AB (ref 3.5–5.1)

## 2015-07-21 LAB — GLUCOSE, CAPILLARY
Glucose-Capillary: 94 mg/dL (ref 65–99)
Glucose-Capillary: 167 mg/dL — ABNORMAL HIGH (ref 65–99)
Glucose-Capillary: 135 mg/dL — ABNORMAL HIGH (ref 65–99)
Glucose-Capillary: 134 mg/dL — ABNORMAL HIGH (ref 65–99)
Glucose-Capillary: 108 mg/dL — ABNORMAL HIGH (ref 65–99)

## 2015-07-21 LAB — PREGNANCY, URINE: Preg Test, Ur: NEGATIVE

## 2015-07-21 SURGERY — ROBOTIC ASSISTED TOTAL HYSTERECTOMY WITH SALPINGECTOMY
Anesthesia: General | Site: Bladder

## 2015-07-21 MED ORDER — KETOROLAC TROMETHAMINE 30 MG/ML IJ SOLN
INTRAMUSCULAR | Status: DC | PRN
  Administered 2015-07-21: 30 mg via INTRAVENOUS

## 2015-07-21 MED ORDER — SODIUM CHLORIDE 0.9 % IJ SOLN
INTRAMUSCULAR | Status: AC
  Filled 2015-07-21: qty 50

## 2015-07-21 MED ORDER — PNEUMOCOCCAL VAC POLYVALENT 25 MCG/0.5ML IJ INJ
0.5000 mL | INJECTION | INTRAMUSCULAR | Status: DC
  Filled 2015-07-21: qty 0.5

## 2015-07-21 MED ORDER — DEXAMETHASONE SODIUM PHOSPHATE 4 MG/ML IJ SOLN
INTRAMUSCULAR | Status: AC
  Filled 2015-07-21: qty 1

## 2015-07-21 MED ORDER — BUPIVACAINE HCL (PF) 0.25 % IJ SOLN
INTRAMUSCULAR | Status: DC | PRN
  Administered 2015-07-21: 10 mL

## 2015-07-21 MED ORDER — ROCURONIUM BROMIDE 100 MG/10ML IV SOLN
INTRAVENOUS | Status: DC | PRN
  Administered 2015-07-21 (×2): 10 mg via INTRAVENOUS
  Administered 2015-07-21: 20 mg via INTRAVENOUS
  Administered 2015-07-21: 10 mg via INTRAVENOUS
  Administered 2015-07-21: 50 mg via INTRAVENOUS

## 2015-07-21 MED ORDER — ESMOLOL HCL 100 MG/10ML IV SOLN
INTRAVENOUS | Status: DC | PRN
  Administered 2015-07-21: 50 mg via INTRAVENOUS

## 2015-07-21 MED ORDER — METFORMIN HCL 500 MG PO TABS
500.0000 mg | ORAL_TABLET | Freq: Every day | ORAL | Status: DC
  Administered 2015-07-22: 500 mg via ORAL
  Filled 2015-07-21 (×3): qty 1

## 2015-07-21 MED ORDER — KETOROLAC TROMETHAMINE 30 MG/ML IJ SOLN
30.0000 mg | Freq: Four times a day (QID) | INTRAMUSCULAR | Status: DC
  Administered 2015-07-21 – 2015-07-22 (×3): 30 mg via INTRAVENOUS
  Filled 2015-07-21 (×3): qty 1

## 2015-07-21 MED ORDER — MEPERIDINE HCL 25 MG/ML IJ SOLN: 6.2500 mg | INTRAMUSCULAR | Status: DC | PRN

## 2015-07-21 MED ORDER — PROPOFOL 10 MG/ML IV BOLUS
INTRAVENOUS | Status: DC | PRN
  Administered 2015-07-21 (×2): 50 mg via INTRAVENOUS
  Administered 2015-07-21: 20 mg via INTRAVENOUS
  Administered 2015-07-21: 50 mg via INTRAVENOUS
  Administered 2015-07-21: 60 mg via INTRAVENOUS
  Administered 2015-07-21: 140 mg via INTRAVENOUS

## 2015-07-21 MED ORDER — LACTATED RINGERS IV SOLN: INTRAVENOUS | Status: DC

## 2015-07-21 MED ORDER — ESMOLOL HCL 100 MG/10ML IV SOLN
INTRAVENOUS | Status: AC
  Filled 2015-07-21: qty 10

## 2015-07-21 MED ORDER — FENTANYL CITRATE (PF) 250 MCG/5ML IJ SOLN
INTRAMUSCULAR | Status: AC
  Filled 2015-07-21: qty 5

## 2015-07-21 MED ORDER — PHENYLEPHRINE 40 MCG/ML (10ML) SYRINGE FOR IV PUSH (FOR BLOOD PRESSURE SUPPORT)
PREFILLED_SYRINGE | INTRAVENOUS | Status: AC
  Filled 2015-07-21: qty 10

## 2015-07-21 MED ORDER — PHENYLEPHRINE HCL 10 MG/ML IJ SOLN
INTRAMUSCULAR | Status: DC | PRN
  Administered 2015-07-21: 40 ug via INTRAVENOUS
  Administered 2015-07-21 (×3): 80 ug via INTRAVENOUS

## 2015-07-21 MED ORDER — FENTANYL CITRATE (PF) 100 MCG/2ML IJ SOLN
INTRAMUSCULAR | Status: DC | PRN
  Administered 2015-07-21 (×2): 50 ug via INTRAVENOUS
  Administered 2015-07-21: 100 ug via INTRAVENOUS
  Administered 2015-07-21: 150 ug via INTRAVENOUS
  Administered 2015-07-21: 50 ug via INTRAVENOUS
  Administered 2015-07-21: 100 ug via INTRAVENOUS

## 2015-07-21 MED ORDER — LIRAGLUTIDE 18 MG/3ML ~~LOC~~ SOPN: 1.8000 mg | PEN_INJECTOR | Freq: Every day | SUBCUTANEOUS | Status: DC

## 2015-07-21 MED ORDER — PHENYLEPHRINE HCL 10 MG/ML IJ SOLN
20.0000 mg | INTRAVENOUS | Status: DC | PRN
  Administered 2015-07-21: 60 ug/min via INTRAVENOUS

## 2015-07-21 MED ORDER — MIDAZOLAM HCL 2 MG/2ML IJ SOLN
INTRAMUSCULAR | Status: AC
  Filled 2015-07-21: qty 2

## 2015-07-21 MED ORDER — OXYCODONE-ACETAMINOPHEN 5-325 MG PO TABS: 1.0000 | ORAL_TABLET | ORAL | Status: DC | PRN

## 2015-07-21 MED ORDER — PROPRANOLOL HCL ER 120 MG PO CP24
120.0000 mg | ORAL_CAPSULE | Freq: Every day | ORAL | Status: DC
  Filled 2015-07-21 (×2): qty 1

## 2015-07-21 MED ORDER — HYDROMORPHONE HCL 1 MG/ML IJ SOLN
INTRAMUSCULAR | Status: AC
  Filled 2015-07-21: qty 1

## 2015-07-21 MED ORDER — DEXAMETHASONE SODIUM PHOSPHATE 10 MG/ML IJ SOLN
INTRAMUSCULAR | Status: DC | PRN
  Administered 2015-07-21: 4 mg via INTRAVENOUS

## 2015-07-21 MED ORDER — LOSARTAN POTASSIUM 50 MG PO TABS
100.0000 mg | ORAL_TABLET | Freq: Every day | ORAL | Status: DC
  Filled 2015-07-21 (×2): qty 2

## 2015-07-21 MED ORDER — SCOPOLAMINE 1 MG/3DAYS TD PT72
MEDICATED_PATCH | TRANSDERMAL | Status: AC
  Administered 2015-07-21: 1.5 mg via TRANSDERMAL
  Filled 2015-07-21: qty 1

## 2015-07-21 MED ORDER — VASOPRESSIN 20 UNIT/ML IV SOLN
INTRAVENOUS | Status: AC
Start: 2015-07-21 — End: 2015-07-21
  Filled 2015-07-21: qty 1

## 2015-07-21 MED ORDER — LIDOCAINE HCL (CARDIAC) 20 MG/ML IV SOLN
INTRAVENOUS | Status: DC | PRN
  Administered 2015-07-21: 80 mg via INTRAVENOUS

## 2015-07-21 MED ORDER — SODIUM CHLORIDE 0.9 % IJ SOLN
INTRAMUSCULAR | Status: AC
Start: 2015-07-21 — End: 2015-07-21
  Filled 2015-07-21: qty 50

## 2015-07-21 MED ORDER — IBUPROFEN 600 MG PO TABS: 600.0000 mg | ORAL_TABLET | Freq: Four times a day (QID) | ORAL | Status: DC | PRN

## 2015-07-21 MED ORDER — STERILE WATER FOR IRRIGATION IR SOLN
Status: DC | PRN
  Administered 2015-07-21: 1000 mL via INTRAVESICAL

## 2015-07-21 MED ORDER — KETOROLAC TROMETHAMINE 30 MG/ML IJ SOLN
INTRAMUSCULAR | Status: AC
  Filled 2015-07-21: qty 1

## 2015-07-21 MED ORDER — DULOXETINE HCL 60 MG PO CPEP
60.0000 mg | ORAL_CAPSULE | Freq: Every day | ORAL | Status: DC
  Filled 2015-07-21 (×2): qty 1

## 2015-07-21 MED ORDER — ROPIVACAINE HCL 5 MG/ML IJ SOLN
INTRAMUSCULAR | Status: AC
  Filled 2015-07-21: qty 30

## 2015-07-21 MED ORDER — HYDROCHLOROTHIAZIDE 25 MG PO TABS: 25.0000 mg | ORAL_TABLET | Freq: Every day | ORAL | Status: DC

## 2015-07-21 MED ORDER — METOCLOPRAMIDE HCL 5 MG/ML IJ SOLN: 10.0000 mg | Freq: Once | INTRAMUSCULAR | Status: DC | PRN

## 2015-07-21 MED ORDER — KCL-LACTATED RINGERS 20 MEQ/L IV SOLN: INTRAVENOUS | Status: DC

## 2015-07-21 MED ORDER — MORPHINE SULFATE (PF) 4 MG/ML IV SOLN: 2.0000 mg | INTRAVENOUS | Status: DC | PRN

## 2015-07-21 MED ORDER — LACTATED RINGERS IR SOLN
Status: DC | PRN
  Administered 2015-07-21: 3000 mL

## 2015-07-21 MED ORDER — ARTIFICIAL TEARS OP OINT
TOPICAL_OINTMENT | OPHTHALMIC | Status: AC
  Filled 2015-07-21: qty 3.5

## 2015-07-21 MED ORDER — SODIUM CHLORIDE 0.9 % IV SOLN
INTRAVENOUS | Status: DC | PRN
  Administered 2015-07-21: 11:00:00

## 2015-07-21 MED ORDER — LIDOCAINE HCL (CARDIAC) 20 MG/ML IV SOLN
INTRAVENOUS | Status: AC
  Filled 2015-07-21: qty 5

## 2015-07-21 MED ORDER — MIDAZOLAM HCL 2 MG/2ML IJ SOLN
INTRAMUSCULAR | Status: DC | PRN
  Administered 2015-07-21: 2 mg via INTRAVENOUS

## 2015-07-21 MED ORDER — ONDANSETRON HCL 4 MG/2ML IJ SOLN
INTRAMUSCULAR | Status: DC | PRN
  Administered 2015-07-21: 4 mg via INTRAVENOUS

## 2015-07-21 MED ORDER — SUGAMMADEX SODIUM 200 MG/2ML IV SOLN
INTRAVENOUS | Status: AC
  Filled 2015-07-21: qty 2

## 2015-07-21 MED ORDER — LOSARTAN POTASSIUM-HCTZ 100-25 MG PO TABS: 1.0000 | ORAL_TABLET | Freq: Every day | ORAL | Status: DC

## 2015-07-21 MED ORDER — MONTELUKAST SODIUM 10 MG PO TABS
10.0000 mg | ORAL_TABLET | Freq: Every day | ORAL | Status: DC
  Administered 2015-07-21: 10 mg via ORAL
  Filled 2015-07-21 (×2): qty 1

## 2015-07-21 MED ORDER — ONDANSETRON HCL 4 MG/2ML IJ SOLN
INTRAMUSCULAR | Status: AC
  Filled 2015-07-21: qty 2

## 2015-07-21 MED ORDER — GLYCOPYRROLATE 0.2 MG/ML IJ SOLN
INTRAMUSCULAR | Status: AC
  Filled 2015-07-21: qty 1

## 2015-07-21 MED ORDER — ONDANSETRON HCL 4 MG PO TABS: 4.0000 mg | ORAL_TABLET | Freq: Four times a day (QID) | ORAL | Status: DC | PRN

## 2015-07-21 MED ORDER — SUGAMMADEX SODIUM 200 MG/2ML IV SOLN
INTRAVENOUS | Status: DC | PRN
  Administered 2015-07-21: 200 mg via INTRAVENOUS

## 2015-07-21 MED ORDER — MENTHOL 3 MG MT LOZG: 1.0000 | LOZENGE | OROMUCOSAL | Status: DC | PRN

## 2015-07-21 MED ORDER — HYDROMORPHONE HCL 1 MG/ML IJ SOLN
0.2500 mg | INTRAMUSCULAR | Status: DC | PRN
  Administered 2015-07-21 (×3): 0.5 mg via INTRAVENOUS

## 2015-07-21 MED ORDER — PROPOFOL 10 MG/ML IV BOLUS
INTRAVENOUS | Status: AC
  Filled 2015-07-21: qty 40

## 2015-07-21 MED ORDER — GLYCOPYRROLATE 0.2 MG/ML IJ SOLN
INTRAMUSCULAR | Status: DC | PRN
  Administered 2015-07-21: 0.1 mg via INTRAVENOUS

## 2015-07-21 MED ORDER — SCOPOLAMINE 1 MG/3DAYS TD PT72
1.0000 | MEDICATED_PATCH | Freq: Once | TRANSDERMAL | Status: AC
  Administered 2015-07-21: 1.5 mg via TRANSDERMAL

## 2015-07-21 MED ORDER — POTASSIUM CHLORIDE 2 MEQ/ML IV SOLN
INTRAVENOUS | Status: DC
  Administered 2015-07-21 – 2015-07-22 (×2): via INTRAVENOUS
  Filled 2015-07-21 (×5): qty 1000

## 2015-07-21 MED ORDER — ROCURONIUM BROMIDE 100 MG/10ML IV SOLN
INTRAVENOUS | Status: AC
  Filled 2015-07-21: qty 1

## 2015-07-21 MED ORDER — LACTATED RINGERS IV SOLN
INTRAVENOUS | Status: DC
  Administered 2015-07-21: 09:00:00 via INTRAVENOUS
  Administered 2015-07-21: 125 mL/h via INTRAVENOUS
  Administered 2015-07-21: 07:00:00 via INTRAVENOUS

## 2015-07-21 MED ORDER — HYDROMORPHONE HCL 1 MG/ML IJ SOLN
INTRAMUSCULAR | Status: DC | PRN
  Administered 2015-07-21: 1 mg via INTRAVENOUS

## 2015-07-21 MED ORDER — ONDANSETRON HCL 4 MG/2ML IJ SOLN: 4.0000 mg | Freq: Four times a day (QID) | INTRAMUSCULAR | Status: DC | PRN

## 2015-07-21 MED ORDER — BUPIVACAINE HCL (PF) 0.25 % IJ SOLN
INTRAMUSCULAR | Status: AC
  Filled 2015-07-21: qty 30

## 2015-07-21 SURGICAL SUPPLY — 64 items
APL SRG 38 LTWT LNG FL B (MISCELLANEOUS) ×2
APPLICATOR ARISTA FLEXITIP XL (MISCELLANEOUS) ×2 IMPLANT
BARRIER ADHS 3X4 INTERCEED (GAUZE/BANDAGES/DRESSINGS) IMPLANT
BRR ADH 4X3 ABS CNTRL BYND (GAUZE/BANDAGES/DRESSINGS)
CANISTER SUCT 3000ML (MISCELLANEOUS) ×4 IMPLANT
CATH FOLEY 3WAY  5CC 16FR (CATHETERS) ×2
CATH FOLEY 3WAY 5CC 16FR (CATHETERS) ×2 IMPLANT
CLOTH BEACON ORANGE TIMEOUT ST (SAFETY) ×4 IMPLANT
CONT PATH 16OZ SNAP LID 3702 (MISCELLANEOUS) ×6 IMPLANT
COVER BACK TABLE 60X90IN (DRAPES) ×8 IMPLANT
COVER LIGHT HANDLE  1/PK (MISCELLANEOUS) ×2
COVER LIGHT HANDLE 1/PK (MISCELLANEOUS) IMPLANT
COVER TIP SHEARS 8 DVNC (MISCELLANEOUS) ×4 IMPLANT
COVER TIP SHEARS 8MM DA VINCI (MISCELLANEOUS) ×2
DECANTER SPIKE VIAL GLASS SM (MISCELLANEOUS) ×8 IMPLANT
DRAPE ROBOTICS STRL (DRAPES) ×2 IMPLANT
DURAPREP 26ML APPLICATOR (WOUND CARE) ×4 IMPLANT
ELECT REM PT RETURN 9FT ADLT (ELECTROSURGICAL) ×4
ELECTRODE REM PT RTRN 9FT ADLT (ELECTROSURGICAL) ×2 IMPLANT
FILTER SMOKE EVAC LAPAROSHD (FILTER) ×2 IMPLANT
GAUZE VASELINE 3X9 (GAUZE/BANDAGES/DRESSINGS) IMPLANT
GLOVE BIO SURGEON STRL SZ 6.5 (GLOVE) ×10 IMPLANT
GLOVE BIO SURGEONS STRL SZ 6.5 (GLOVE) ×4
GLOVE BIOGEL PI IND STRL 6.5 (GLOVE) ×2 IMPLANT
GLOVE BIOGEL PI IND STRL 7.0 (GLOVE) ×8 IMPLANT
GLOVE BIOGEL PI INDICATOR 6.5 (GLOVE) ×2
GLOVE BIOGEL PI INDICATOR 7.0 (GLOVE) ×8
GOWN STRL REUS W/TWL LRG LVL3 (GOWN DISPOSABLE) ×8 IMPLANT
HEMOSTAT ARISTA ABSORB 3G PWDR (MISCELLANEOUS) ×2 IMPLANT
KIT ACCESSORY DA VINCI DISP (KITS) ×2
KIT ACCESSORY DVNC DISP (KITS) ×2 IMPLANT
LEGGING LITHOTOMY PAIR STRL (DRAPES) ×4 IMPLANT
LIQUID BAND (GAUZE/BANDAGES/DRESSINGS) ×4 IMPLANT
NEEDLE INSUFFLATION 120MM (ENDOMECHANICALS) ×4 IMPLANT
NEEDLE INSUFFLATION 150MM (ENDOMECHANICALS) ×2 IMPLANT
OCCLUDER COLPOPNEUMO (BALLOONS) ×2 IMPLANT
PACK ROBOT WH (CUSTOM PROCEDURE TRAY) ×4 IMPLANT
PACK ROBOTIC GOWN (GOWN DISPOSABLE) ×4 IMPLANT
PACK VAGINAL MINOR WOMEN LF (CUSTOM PROCEDURE TRAY) ×4 IMPLANT
PAD PREP 24X48 CUFFED NSTRL (MISCELLANEOUS) ×8 IMPLANT
PAD TRENDELENBURG POSITION (MISCELLANEOUS) ×4 IMPLANT
SET BI-LUMEN FLTR TB AIRSEAL (TUBING) ×6 IMPLANT
SET CYSTO W/LG BORE CLAMP LF (SET/KITS/TRAYS/PACK) ×4 IMPLANT
SET IRRIG TUBING LAPAROSCOPIC (IRRIGATION / IRRIGATOR) ×6 IMPLANT
SUT VIC AB 0 CT2 27 (SUTURE) ×12 IMPLANT
SUT VIC AB 4-0 PS2 27 (SUTURE) ×8 IMPLANT
SUT VICRYL 0 UR6 27IN ABS (SUTURE) ×8 IMPLANT
SUT VLOC 180 0 9IN  GS21 (SUTURE)
SUT VLOC 180 0 9IN GS21 (SUTURE) IMPLANT
SYR 50ML LL SCALE MARK (SYRINGE) ×4 IMPLANT
SYSTEM CARTER THOMASON II (TROCAR) ×4 IMPLANT
TIP RUMI ORANGE 6.7MMX12CM (TIP) IMPLANT
TIP UTERINE 5.1X6CM LAV DISP (MISCELLANEOUS) IMPLANT
TIP UTERINE 6.7X10CM GRN DISP (MISCELLANEOUS) ×2 IMPLANT
TIP UTERINE 6.7X6CM WHT DISP (MISCELLANEOUS) IMPLANT
TIP UTERINE 6.7X8CM BLUE DISP (MISCELLANEOUS) ×2 IMPLANT
TOWEL OR 17X24 6PK STRL BLUE (TOWEL DISPOSABLE) ×12 IMPLANT
TRAY FOLEY CATH SILVER 14FR (SET/KITS/TRAYS/PACK) ×2 IMPLANT
TROCAR DILATING TIP 12MM 150MM (ENDOMECHANICALS) ×4 IMPLANT
TROCAR DISP BLADELESS 8 DVNC (TROCAR) ×2 IMPLANT
TROCAR DISP BLADELESS 8MM (TROCAR) ×2
TROCAR XCEL NON-BLD 5MMX100MML (ENDOMECHANICALS) ×4 IMPLANT
WATER STERILE IRR 1000ML POUR (IV SOLUTION) ×8 IMPLANT
YANKAUER SUCT BULB TIP NO VENT (SUCTIONS) ×2 IMPLANT

## 2015-07-21 NOTE — Progress Notes (Signed)
Day of Surgery Procedure(s) (LRB): ROBOTIC ASSISTED TOTAL HYSTERECTOMY WITH SALPINGECTOMY (Bilateral) CYSTOSCOPY (N/A)  Subjective: Patient reports some low back ache.  No void yet but feels the need. Good pain control  Objective: I have reviewed patient's vital signs, intake and output and labs. FSBG levels - 130s - 160s.  T max 100.2, T now 97.4.  BP 124/78, P 88, RR 13. I/O - 2400 cc/900 cc.  General: alert and cooperative Resp: clear to auscultation bilaterally Cardio: regular rate and rhythm, S1, S2 normal, no murmur, click, rub or gallop GI: soft, non-tender; bowel sounds normal; no masses,  no organomegaly and incision: clean, dry and intact Extremities: PAs and Ted hose on.  DPs 1+ bilaterally.  Vaginal Bleeding: minimal   K 3.2 on repeat lab at surgical start today.   Assessment: s/p Procedure(s): ROBOTIC ASSISTED TOTAL HYSTERECTOMY WITH SALPINGECTOMY (Bilateral) CYSTOSCOPY (N/A): stable  Plan: Advance diet Encourage ambulation Advance to PO medication  Surgical findings and procedure reviewed with patient.  Will start carb contrelled diet and Metformin in the am.  She will take her Victoza at home tomorrow.  CBC and BMP in the am.      Arloa Koh 07/21/2015, 4:42 PM

## 2015-07-21 NOTE — Op Note (Signed)
OPERATIVE REPORT   PREOPERATIVE DIAGNOSIS:   Uterine fibroids, dysmenorrhea, menorrhagia  POSTOPERATIVE DIAGNOSIS:   Uterine fibroids, dysmenorrhea, menorrhagia  PROCEDURES: Da Vinci robotic total laparoscopic hysterectomy with bilateral salpingectomy, cystoscopy, transvaginal repair of vaginal side wall lacerations.  SURGEON: Lenard Galloway, M.D.  ASSISTANT: Lyman Speller, M.D.  ANESTHESIA: General endotracheal, intraperitoneal ropivicaine 30 mL diluted in 30 mL of normal saline, local with 0.25% Marcaine.  IV FLUIDS:  1000 cc LR.  ESTIMATED BLOOD LOSS:  125 cc.  URINE OUTPUT: 850 cc.  COMPLICATIONS: None.  INDICATIONS FOR THE PROCEDURE:    The patient is a 45 year old P31 African American female who presents with uterine fibroids with heavy bleeding and dysmenorrhea.  The patient had a Mirena IUD which was removed due to the position being low in the endometrial canal due to her fibroid.  Recently, she has been on OrthoMicronor to control her menses.  She declines future childbearing and wishes for hysterectomy.   A plan is made to proceed with a robotic total laparoscopic hysterectomy with bilateral salpingectomy and cystoscopy after risks, benefits, and alternatives are reviewed.  FINDINGS:    The uterine fundus had an 8 cm and 4 cm fibroid. The tubes and ovaries were normal.  There was a small adhesion of the ovary to the left pelvic sidewall.  This was lysed.  There was no sign of endometriosis in the abdomen.    Cystoscopy at the termination of the procedure showed the bladder to be normal throughout 360 degrees including the bladder dome and trigone. There was no evidence of any foreign body in the bladder or the urethra. There was no evidence of any lesions of the bladder or the urethra.  Both of the ureters were noted to be patent bilaterally.  SPECIMENS:    The uterus, cervix, and bilateral tubes were went to pathology.   DESCRIPTION OF  PROCEDURE:   The patient was reidentified in the preoperative hold area.  She did receive Flagyl and Ciprofloxacin IV for antibiotic prophylaxis. She received TED hose and PAS stockings for DVT prophylaxis.  In the operating room, the patient was placed in the dorsal lithotomy position on the operating room table. The anti-slip pad was used under the patient. Her legs were placed in the [place] stirrups and her arms were both tucked at her sides with the appropriate padded arm protectors. The patient received general endotracheal anesthesia and she then received proper padding around her head and neck.  The abdomen and vagina were then sterilely prepped and she was sterilely draped.  A speculum was placed in the vagina and a single-tooth tenaculum was placed on the anterior cervical lip. A figure-of-eight suture of 0 Vicryl was placed on each the anterior and the posterior cervical lips. The uterus was sounded to 9 cm. The cervix was then dilated with [facility] dilators. A # 8 RUMI tip with a KOH ring was then placed through the cervix and into the uterine cavity. The ring applied nicely to the cervix and 3 cc of fluid were placed in the balloon. The remaining vaginal instruments were then removed. A Foley catheter was placed inside the bladder.  Attention was turned to the abdomen where the infraumbilical region was injected with 0.25% Marcaine and a small incision created.  A Veress needle was then used to insufflate the abdomen with CO2 gas after a saline drop test was performed and the fluid flowed freely.  A 5 mm left upper quadrant incision was created the  a scalpel and a 5 mm Optiview was used to enter the peritoneal cavity.  A 12 mm supraumbilical incision was created with a scalpel after the skin was injected locally with 0.25% Marcaine. A 12 mm robotic camera port was then placed through this umbilical incision. 8 mm incisions were then created to the right and left of the supraumbilical  incision after the skin was injected locally with 0.25% Marcaine.  The 8 mm robotic trocars were then placed under visualization of the laparoscope.  At this time, the patient was placed in Trendelenburg position. An inspection of the abdomen and pelvis was performed. The findings are as noted above.   The robot was docked on the patient's left-hand side. The monopolarlaparoscopic scissors was placed in robotic port #1 and the PK instrument was placed through robotic port site #2.  At this time, I stepped to the surgeon's console while my assistant and the rest of the surgical team remained at the patient's side.  The left adnexa could be well reached and identified, and the mesosalpinx cauterized with a PK instrument and then sharply divided with the laparoscopic scissors in order to separate the left fallopian tube from the ovary and the surrounding broad ligament. Dissection was then continued through the broad ligament using again the PK instrument for cautery and the monopolar scissors for cutting.  The left round ligament was then cauterized with the PK instrument and sharply divided with the laparoscopic scissors. The left utero-ovarian ligament was cauterized and cut with the PK.  Dissection was performed to the anterior and posterior leaves of the broad ligaments again using the monopolar laparoscopic scissors. The incision was carried across the anterior cul- de-sac along the vesicouterine fold and the bladder was dissected away from the cervix using monopolar cautery and the laparoscopic scissors. The peritoneum was taken down posteriorly. The left uterine artery was skeletonized at this time using sharp dissection and monopolar cautery.  It was not cut until after the right uterine artery was later skeletonized and cut on the right side.   Ropivicine was placed inside the peritoneal cavity.   Attention was turned to the patient's right-hand side at this time. The right adnexa was  identified and the right mesosalpinx was cauterized with the PK instrument and then bisected with the laparoscopic monopolar scissors in order to free the right fallopian tube from the right ovary and surrounding broad ligament. Dissection continued through the right broad ligament using the monopolar scissors for cautery and cutting. The right round ligament was then cauterized with the PK instrument and sharply divided with the laparoscopic monopolar scissors. The right utero-ovarian ligament was cauterized and bisected with the PK.  Again, the anterior and posterior leaves of the broad ligament were taken down using monopolar laparoscopic scissors for dissection. The bladder flap was taken down anteriorly using the monopolar scissors. The uterine artery was skeletonized along the patient's right hand side and was then cauterized with the PK instrument and was divided with the laparoscopic scissors.    The KOH ring was identified. The colpotomy incision was performed with the laparoscopic monopolar scissors in a circumferential fashion. A laparoscopic tenaculum was used to lift the uterine fundus while the colpotomy incison was created.  The specimen was then removed from the peritoneal cavity and was sent to Pathology. The balloon occluder was placed in the vagina. The vaginal cuff was sutured at this time using a running suture of 0 V-Loc. The vagina was closed from the patient's right hand  side to the left hand side and then back 2 sutures towards the midline. This provided good full-thickness closure of the vaginal cuff.  The pelvis was irrigated and suctioned.  There was good hemostasis of the operative sites and pedicles. Arista was placed over the operative sites in the pelvis.  The laparoscopic needle for suturing was parked in the anterior peritoneum.  Final inspection of all operative sites demonstrated good hemostasis. The robotic laparoscopic instruments were removed.  The robot was  undocked.  A 5-mm laparoscope was placed at this time and the needle was retrieved from inside the peritoneal cavity. The laparoscopic trocars were removed under visualization of the laparoscope. The CO2 pneumoperitoneum was released. The patient received manual breaths to remove any remaining CO2 gas and then the umbilical trocar was removed.  The abdominal incisions were closed. The umbilical fascia was closed with two figure-of-eight sutures of 0 Vicryl. All skin incisions were closed with subcuticular sutures of 4-0 Vicryl. Dermabond was placed over the incisions and the umbilical incision was closed with a honeycomb dressing.  The patient's 3-way Foley catheter was removed at this time and cystoscopy was performed and the findings are as noted above. The Foley catheter was then placed inside the bladder and left to gravity drainage.  Final inspection of the vagina demonstrated small vaginal lacerations of the vaginal mucosa.  One was on the patient's right mid vaginal wall and the other two were on the left vaginal wall.  There were sutured with figure of eight sutures of 0/0 Vicryl.  Hemostasis was good. The vaginal cuff was intact and hemostatic.   This concluded the patient's procedure. She was extubated and escorted to the recovery room in stable and awake condition. There were no complications to the procedure. All needle, instrument, and sponge counts were correct.  This procedure required four hours of surgical time due to the patient's body habitus and the size of the uterine specimen.     Lenard Galloway, M.D.

## 2015-07-21 NOTE — Brief Op Note (Signed)
07/21/2015  11:56 AM  PATIENT:  Krista [name]  45 y.o. female  PRE-OPERATIVE DIAGNOSIS:  uterine fibroids, dysmenorrhea, menorrhagia  POST-OPERATIVE DIAGNOSIS:  uterine fibroids, dysmenorrhea, menorrhagia  PROCEDURE:  Procedure(s): ROBOTIC ASSISTED TOTAL HYSTERECTOMY WITH SALPINGECTOMY (Bilateral) CYSTOSCOPY (N/A)  SURGEON:  Surgeon(s) and Role:    * Jayton Popelka E Yisroel Ramming, MD - Primary    * Megan Salon, MD - Assisting  PHYSICIAN ASSISTANT: NA  ASSISTANTS: Edwinna Areola, MD   ANESTHESIA:   local, general and Intraperitoneal Ropivicaine  EBL:  Total I/O In: 1000 [I.V.:1000] Out: 850 [Urine:750; Blood:100]  BLOOD ADMINISTERED:none  DRAINS: Urinary Catheter (Foley)   LOCAL MEDICATIONS USED:  MARCAINE  .25%, Intraperitoneal Ropivicaine 30 cc in 30 cc NS.  SPECIMEN:  Source of Specimen:  Uterus, cervix, bilateral tubes.  DISPOSITION OF SPECIMEN:  PATHOLOGY  COUNTS:  YES  TOURNIQUET:  * No tourniquets in log *  DICTATION: .Note written in [place]: Admit for overnight observation  PATIENT DISPOSITION:  PACU - hemodynamically stable.   Delay start of Pharmacological VTE agent (>24hrs) due to surgical blood loss or risk of bleeding: not applicable

## 2015-07-21 NOTE — Transfer of Care (Signed)
Immediate Anesthesia Transfer of Care Note  Patient: Krista [name]  Procedure(s) Performed: Procedure(s): ROBOTIC ASSISTED TOTAL HYSTERECTOMY WITH SALPINGECTOMY (Bilateral) CYSTOSCOPY (N/A)  Patient Location: PACU  Anesthesia Type:General  Level of Consciousness: awake, alert  and oriented  Airway & Oxygen Therapy: Patient Spontanous Breathing and Patient connected to nasal cannula oxygen  Post-op Assessment: Report given to RN and Post -op Vital signs reviewed and stable  Post vital signs: Reviewed and stable  Last Vitals:  Filed Vitals:   07/21/15 0627  BP: 120/92  Temp: 36.7 C  Resp: 18    Last Pain: There were no vitals filed for this visit.    Patients Stated Pain Goal: 2 ([id number])  Complications: No apparent anesthesia complications

## 2015-07-21 NOTE — Anesthesia Procedure Notes (Signed)
Procedure Name: Intubation Date/Time: 07/21/2015 7:47 AM Performed by: Jonna Munro Pre-anesthesia Checklist: Patient identified, Emergency Drugs available, Suction available and Patient being monitored Patient Re-evaluated:Patient Re-evaluated prior to inductionOxygen Delivery Method: Circle system utilized Preoxygenation: Pre-oxygenation with 100% oxygen Intubation Type: IV induction Ventilation: Two handed mask ventilation required and Oral airway inserted - appropriate to patient size Laryngoscope Size: Mac and 4 Grade View: Grade III Tube type: Oral Tube size: 7.0 mm Number of attempts: 1 Airway Equipment and Method: Stylet and Oral airway (Ramped with blankets) Placement Confirmation: positive ETCO2,  breath sounds checked- equal and bilateral and ETT inserted through vocal cords under direct vision Secured at: 22 cm Tube secured with: Tape Dental Injury: Teeth and Oropharynx as per pre-operative assessment  Difficulty Due To: Difficult Airway- due to reduced neck mobility and Difficult Airway- due to limited oral opening

## 2015-07-21 NOTE — Progress Notes (Signed)
Update to History and Physical  Hemoglobin A1C is 7.8. Potassium 3.1.  Patient examined.   OK to proceed with surgery.  Will recheck K level.   Waiting for anesthesia to start IV.

## 2015-07-21 NOTE — Anesthesia Postprocedure Evaluation (Signed)
Anesthesia Post Note  Patient: [name]  Procedure(s) Performed: Procedure(s) (LRB): ROBOTIC ASSISTED TOTAL HYSTERECTOMY WITH SALPINGECTOMY (Bilateral) CYSTOSCOPY (N/A)  Patient location during evaluation: PACU Anesthesia Type: General Level of consciousness: awake and alert Pain management: pain level controlled Vital Signs Assessment: post-procedure vital signs reviewed and stable Respiratory status: spontaneous breathing, nonlabored ventilation, respiratory function stable and patient connected to nasal cannula oxygen Cardiovascular status: blood pressure returned to baseline and stable Postop Assessment: no signs of nausea or vomiting Anesthetic complications: no     Last Vitals:  Filed Vitals:   07/21/15 1345 07/21/15 1400  BP: 119/68 120/66  Pulse: 92 91  Temp:    Resp: 13 13    Last Pain:  Filed Vitals:   07/21/15 1418  PainSc: Asleep   Pain Goal: Patients Stated Pain Goal: 2 (07/21/15 1400)               [name] J

## 2015-07-22 ENCOUNTER — Encounter (HOSPITAL_COMMUNITY): Payer: Self-pay | Admitting: Obstetrics and Gynecology

## 2015-07-22 DIAGNOSIS — D251 Intramural leiomyoma of uterus: Secondary | ICD-10-CM | POA: Diagnosis not present

## 2015-07-22 LAB — BASIC METABOLIC PANEL
ANION GAP: 5 (ref 5–15)
BUN: 9 mg/dL (ref 6–20)
CALCIUM: 8.3 mg/dL — AB (ref 8.9–10.3)
CO2: 30 mmol/L (ref 22–32)
CREATININE: 0.83 mg/dL (ref 0.44–1.00)
Chloride: 103 mmol/L (ref 101–111)
Glucose, Bld: 103 mg/dL — ABNORMAL HIGH (ref 65–99)
Potassium: 3.6 mmol/L (ref 3.5–5.1)
SODIUM: 138 mmol/L (ref 135–145)

## 2015-07-22 LAB — CBC
HEMATOCRIT: 31.7 % — AB (ref 36.0–46.0)
Hemoglobin: 10.3 g/dL — ABNORMAL LOW (ref 12.0–15.0)
MCH: 26.6 pg (ref 26.0–34.0)
MCHC: 32.5 g/dL (ref 30.0–36.0)
MCV: 81.9 fL (ref 78.0–100.0)
PLATELETS: 258 10*3/uL (ref 150–400)
RBC: 3.87 MIL/uL (ref 3.87–5.11)
RDW: 14.8 % (ref 11.5–15.5)
WBC: 9.6 10*3/uL (ref 4.0–10.5)

## 2015-07-22 LAB — GLUCOSE, CAPILLARY: Glucose-Capillary: 103 mg/dL — ABNORMAL HIGH (ref 65–99)

## 2015-07-22 MED ORDER — OXYCODONE-ACETAMINOPHEN 5-325 MG PO TABS: 1.0000 | ORAL_TABLET | ORAL | Status: DC | PRN

## 2015-07-22 MED ORDER — PNEUMOCOCCAL VAC POLYVALENT 25 MCG/0.5ML IJ INJ: 0.5000 mL | INJECTION | Freq: Once | INTRAMUSCULAR | Status: DC

## 2015-07-22 MED ORDER — IBUPROFEN 600 MG PO TABS: 600.0000 mg | ORAL_TABLET | Freq: Four times a day (QID) | ORAL | Status: DC | PRN

## 2015-07-22 NOTE — Progress Notes (Signed)
1 Day Post-Op Procedure(s) (LRB): ROBOTIC ASSISTED TOTAL HYSTERECTOMY WITH SALPINGECTOMY (Bilateral) CYSTOSCOPY (N/A)  Subjective: Patient reports tolerating PO, + flatus and no problems voiding.    Objective: I have reviewed patient's vital signs, intake and output and labs. T max 100.2, T now 98.2, BP 107/60, P 83, RR 22.  I/O - 4715 cc/3000 cc. Hgb 10.3, K 3.6.  General: alert and cooperative Resp: clear to auscultation bilaterally Cardio: regular rate and rhythm, S1, S2 normal, no murmur, click, rub or gallop GI: soft, non-tender; bowel sounds normal; no masses,  no organomegaly and incision: clean, dry and intact Vaginal Bleeding: none  Assessment: s/p Procedure(s): ROBOTIC ASSISTED TOTAL HYSTERECTOMY WITH SALPINGECTOMY (Bilateral) CYSTOSCOPY (N/A): progressing well Ready for discharge.  Plan: Discharge home  Instructions reviewed in verbal and written form.  Follow up in 5 days for post op visit.       Krista [name] A Quincy Simmonds 07/22/2015, 7:37 AM

## 2015-07-22 NOTE — Discharge Instructions (Signed)
Total Laparoscopic Hysterectomy, Care After °Refer to this sheet in the next few weeks. These instructions provide you with information on caring for yourself after your procedure. Your health care provider may also give you more specific instructions. Your treatment has been planned according to current medical practices, but problems sometimes occur. Call your health care provider if you have any problems or questions after your procedure. °WHAT TO EXPECT AFTER THE PROCEDURE °· Pain and bruising at the incision sites. You will be given pain medicine to control it. °· Menopausal symptoms such as hot flashes, night sweats, and insomnia if your ovaries were removed. °· Sore throat from the breathing tube that was inserted during surgery. °HOME CARE INSTRUCTIONS °· Only take over-the-counter or prescription medicines for pain, discomfort, or fever as directed by your health care provider.   °· Do not take aspirin. It can cause bleeding.   °· Do not drive when taking pain medicine.   °· Follow your health care provider's advice regarding diet, exercise, lifting, driving, and general activities.   °· Resume your usual diet as directed and allowed.   °· Get plenty of rest and sleep.   °· Do not douche, use tampons, or have sexual intercourse for at least 6 weeks, or until your health care provider gives you permission.   °· Change your bandages (dressings) as directed by your health care provider.   °· Monitor your temperature and notify your health care provider of a fever.   °· Take showers instead of baths for 2-3 weeks.   °· Do not drink alcohol until your health care provider gives you permission.   °· If you develop constipation, you may take a mild laxative with your health care provider's permission. Bran foods may help with constipation problems. Drinking enough fluids to keep your urine clear or pale yellow may help as well.   °· Try to have someone home with you for 1-2 weeks to help around the house.    °· Keep all of your follow-up appointments as directed by your health care provider.   °SEEK MEDICAL CARE IF: °· You have swelling, redness, or increasing pain around your incision sites.   °· You have pus coming from your incision.   °· You notice a bad smell coming from your incision.   °· Your incision breaks open.   °· You feel dizzy or lightheaded.   °· You have pain or bleeding when you urinate.   °· You have persistent diarrhea.   °· You have persistent nausea and vomiting.   °· You have abnormal vaginal discharge.   °· You have a rash.   °· You have any type of abnormal reaction or develop an allergy to your medicine.   °· You have poor pain control with your prescribed medicine.   °SEEK IMMEDIATE MEDICAL CARE IF: °· You have chest pain or shortness of breath. °· You have severe abdominal pain that is not relieved with pain medicine. °· You have pain or swelling in your legs. °MAKE SURE YOU: °· Understand these instructions. °· Will watch your condition. °· Will get help right away if you are not doing well or get worse. °  °This information is not intended to replace advice given to you by your health care provider. Make sure you discuss any questions you have with your health care provider. °  °Document Released: 11/07/2012 Document Revised: 01/22/2013 Document Reviewed: 11/07/2012 °Elsevier Interactive Patient Education ©2016 [employer] Inc. ° °

## 2015-07-22 NOTE — Progress Notes (Signed)
Pt is discharged in the care of husband. Denies any pain or discomfort. Voiding without difficulty. Understands all discharged instruction given. No vaginal bleeding . Stable.No equipment needed for home use.

## 2015-07-26 NOTE — Discharge Summary (Signed)
Physician Discharge Summary  Patient ID: Krista [name] MRN: [id number] DOB/AGE: [birth year] 46 y.o.  Admit date: 07/21/2015 Discharge date:  07/22/15 Admission Diagnoses: 1.  Uterine fibroids. 2.  Dysmenorrhea. 3.  Menorrhagia. 4.  Adult onset diabetes mellitus. 5.  Hypokalemia.  K = 3.1. 6.  Sleep apnea.  Discharge Diagnoses:  1.  Symptomatic uterine fibroids. 2.  Dysmenorrhea. 3.  Menorrhagia. 4.  Status post Da Vinci robotic total laparoscopic hysterectomy with bilateral salpingectomy, cystoscopy, transvaginal repair of vaginal side wall lacerations. 5.  Adult onset diabetes mellitus. 6.  Hypokalemia, resolved.  K = 3.6. 7.  Sleep apnea.  Active Problems:   Status post laparoscopic hysterectomy   Discharged Condition: good  Hospital Course: The patient was admitted on 07/21/15 for a Da Vinci robotic total laparoscopic hysterectomy with bilateral salpingectomy, cystoscopy, transvaginal repair of vaginal side wall lacerations which were performed without complication while under general anesthesia.  She has a history of adult onset diabetes, obesity, HTN, and sleep apnea.  The patient's post op course was uneventful.  She had a morphine PCA and Toradol for pain control initially, and this was converted over to Percocet and Motrin on post op day one when the patient began taking po well.  She ambulated independently and wore PAS and Ted hose for DVT prophylaxis while in bed. The patient's vital signs remained stable and she demonstrated no signs of infection during her hospitalization.  The patient's post op day one Hgb was 10.3.   She was tolerating the this well.  Her blood sugar levels on post op day zero were in the 130 - 160 range.  She was started back on her metformin during her hospital stay, and she received a carbohydrate modified diet.  She will reinitiate her Victoza at home after discharge.  The patient did receive KCL 20 mEq IV during her hospital stay as her preop K level was  3.1  On post op day one, her potassium normalized to a level of 3.6.  She used her CPAP machine overnight.  She had very minimal vaginal bleeding, and her incision(s) demonstrated no signs of erythema or significant drainage.  She was found to be in good condition and ready for discharge on post op day one.   Consults: None  Significant Diagnostic Studies: labs: See Hospital course.  Treatments: surgery:  Da Vinci robotic total laparoscopic hysterectomy with bilateral salpingectomy, cystoscopy, transvaginal repair of vaginal side wall lacerations  Discharge Exam: Blood pressure 107/60, pulse 83, temperature 98.2 F (36.8 C), temperature source Oral, resp. rate 22, last menstrual period 07/02/2015, SpO2 99 %. General: alert and cooperative Resp: clear to auscultation bilaterally Cardio: regular rate and rhythm, S1, S2 normal, no murmur, click, rub or gallop GI: soft, non-tender; bowel sounds normal; no masses, no organomegaly and incision: clean, dry and intact Vaginal Bleeding: none  Disposition: 01-Home or Self Care  Discharge instructions were reviewed in verbal and written form.     Medication List    STOP taking these medications        fluconazole 150 MG tablet  Commonly known as:  DIFLUCAN     metroNIDAZOLE 500 MG tablet  Commonly known as:  FLAGYL     norethindrone 0.35 MG tablet  Commonly known as:  MICRONOR,CAMILA,ERRIN     nystatin-triamcinolone cream  Commonly known as:  MYCOLOG II      TAKE these medications        DULoxetine 60 MG capsule  Commonly known as:  CYMBALTA  Take  1 capsule by mouth daily before supper.     fexofenadine 180 MG tablet  Commonly known as:  ALLEGRA  Take 180 mg by mouth daily as needed.     ibuprofen 600 MG tablet  Commonly known as:  ADVIL,MOTRIN  Take 1 tablet (600 mg total) by mouth every 6 (six) hours as needed (mild pain).     losartan-hydrochlorothiazide 100-25 MG tablet  Commonly known as:  HYZAAR  Take 1 tablet by  mouth daily. Reported on 07/11/2015     metFORMIN 500 MG tablet  Commonly known as:  GLUCOPHAGE  Take 1 tablet by mouth daily.     montelukast 10 MG tablet  Commonly known as:  SINGULAIR  Take 10 mg by mouth at bedtime.     MUCINEX PO  Take by mouth as needed.     multivitamin capsule  Take 1 capsule by mouth daily.     omeprazole 20 MG capsule  Commonly known as:  PRILOSEC  Take 1 capsule by mouth daily.     oxyCODONE-acetaminophen 5-325 MG tablet  Commonly known as:  PERCOCET/ROXICET  Take 1-2 tablets by mouth every 4 (four) hours as needed for severe pain (moderate to severe pain (when tolerating fluids)).     pneumococcal 23 valent vaccine 25 MCG/0.5ML injection  Commonly known as:  PNU-IMMUNE  Inject 0.5 mLs into the muscle once.     propranolol ER 120 MG 24 hr capsule  Commonly known as:  INDERAL LA  Take 1 capsule by mouth daily.     SYMBICORT 160-4.5 MCG/ACT inhaler  Generic drug:  budesonide-formoterol  Inhale 1 puff into the lungs as needed.     VICTOZA 18 MG/3ML Sopn  Generic drug:  Liraglutide  Inject 1.8 mg into the skin daily.     Vitamin D (Ergocalciferol) 50000 units Caps capsule  Commonly known as:  DRISDOL  Take 50,000 Units by mouth daily.           Follow-up Information    Follow up with Arloa Koh, MD In 5 days.   Specialty:  Obstetrics and Gynecology   Contact information:   [address] [place] [address] [phone]       Signed: Arloa Koh 07/26/2015, 2:36 PM

## 2015-07-27 ENCOUNTER — Ambulatory Visit (INDEPENDENT_AMBULATORY_CARE_PROVIDER_SITE_OTHER): Payer: BC Managed Care – PPO | Admitting: Obstetrics and Gynecology

## 2015-07-27 ENCOUNTER — Encounter: Payer: Self-pay | Admitting: Obstetrics and Gynecology

## 2015-07-27 VITALS — BP 110/62 | HR 90 | Ht 65.0 in | Wt 244.0 lb

## 2015-07-27 DIAGNOSIS — Z9889 Other specified postprocedural states: Secondary | ICD-10-CM

## 2015-07-27 DIAGNOSIS — D6489 Other specified anemias: Secondary | ICD-10-CM

## 2015-07-27 NOTE — Progress Notes (Signed)
Patient ID: Krista [name], female   DOB: July 13, 1970, 45 y.o.   MRN: [id number] GYNECOLOGY  VISIT   HPI: 45 y.o.   Married  [place] American  female   [phone] with Patient's last menstrual period was 06/30/2015 (approximate).   here for 1 week follow up ROBOTIC ASSISTED TOTAL HYSTERECTOMY WITH SALPINGECTOMY (Bilateral Abdomen)  CYSTOSCOPY (N/A Bladder). Final pathology - benign fibroids - 4.   No pain med use today.   States she pulled the dermabond off a left sided incision.   Having gas pains and pressure. Had a BM today.  Taking stool softeners.  Taking TUMs.  Voiding well.   No bleeding.   Blood sugars are 103 - 200.  Ate [place] food high is sugar.  HGB:  12.4    GYNECOLOGIC HISTORY: Patient's last menstrual period was 06/30/2015 (approximate). Contraception:  Hysterectomy Menopausal hormone therapy:  n/a Last mammogram:  02-18-14 Density B/Neg/BiRads1:[facility] Last pap smear:   12-12-13 Neg:Neg HR HPV        OB History    Gravida Para Term Preterm AB TAB SAB Ectopic Multiple Living   1 1 1       1          Patient Active Problem List   Diagnosis Date Noted  . 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 Morbid obesity ([place]) 11/22/2012  . OSA on CPAP 11/22/2012  . HTN (hypertension) 11/22/2012  . DOE (dyspnea on exertion) 11/22/2012  . Abnormal EKG 11/22/2012    Past Medical History  Diagnosis Date  . Hypertension   . Seasonal allergies   . GERD (gastroesophageal reflux disease)   . Anxiety   . Fibroid   . STD (sexually transmitted disease)     hx HPV  . Abnormal Pap smear of cervix 2006    Leep, colpo  . History of palpitations     PVCs - wore holter monitor, negative results  . Restless leg syndrome     patient denies this dx  . Pre-diabetes   . SVD (spontaneous vaginal delivery)     x 1  . OSA (obstructive sleep apnea) 06/2010    AHI during total sleep 5.9/hr and REM sleep 28.1/hr (mild OSA)  06/2010, uses CPAP nightly  . Diabetes mellitus without complication ([place])     type 2    Past Surgical History  Procedure Laterality Date  . Colposcopy  2006  . Cervical biopsy  w/ loop electrode excision  2006  . Wisdom tooth extraction    . Upper gi endoscopy    . Robotic assisted total hysterectomy with salpingectomy Bilateral 07/21/2015    Procedure: ROBOTIC ASSISTED TOTAL HYSTERECTOMY WITH SALPINGECTOMY;  Surgeon: [name], MD;  Location: [facility];  Service: Gynecology;  Laterality: Bilateral;  . Cystoscopy N/A 07/21/2015    Procedure: CYSTOSCOPY;  Surgeon: [name], MD;  Location: [facility];  Service: Gynecology;  Laterality: N/A;    Current Outpatient Prescriptions  Medication Sig Dispense Refill  . DULoxetine (CYMBALTA) 60 MG capsule Take 1 capsule by mouth daily before supper.     . fexofenadine (ALLEGRA) 180 MG tablet Take 180 mg by mouth daily as needed.     . GuaiFENesin (MUCINEX PO) Take by mouth as needed.    Marland Kitchen ibuprofen (ADVIL,MOTRIN) 600 MG tablet Take 1 tablet (600 mg total) by mouth every 6 (six) hours as needed (mild pain). 30 tablet 0  . Liraglutide (VICTOZA) 18 MG/3ML  SOPN Inject 1.8 mg into the skin daily.     Marland Kitchen losartan-hydrochlorothiazide (HYZAAR) 100-25 MG tablet Take 1 tablet by mouth daily. Reported on 07/11/2015    . metFORMIN (GLUCOPHAGE) 500 MG tablet Take 1 tablet by mouth daily.    . montelukast (SINGULAIR) 10 MG tablet Take 10 mg by mouth at bedtime.     . Multiple Vitamin (MULTIVITAMIN) capsule Take 1 capsule by mouth daily.    Marland Kitchen omeprazole (PRILOSEC) 20 MG capsule Take 1 capsule by mouth daily.    Marland Kitchen oxyCODONE-acetaminophen (PERCOCET/ROXICET) 5-325 MG tablet Take 1-2 tablets by mouth every 4 (four) hours as needed for severe pain (moderate to severe pain (when tolerating fluids)). 30 tablet 0  . propranolol ER (INDERAL LA) 120 MG 24 hr capsule Take 1 capsule by mouth daily.    . SYMBICORT 160-4.5 MCG/ACT inhaler Inhale 1 puff  into the lungs as needed.     . Vitamin D, Ergocalciferol, (DRISDOL) 50000 UNITS CAPS capsule Take 50,000 Units by mouth daily.    . pneumococcal 23 valent vaccine (PNU-IMMUNE) 25 MCG/0.5ML injection Inject 0.5 mLs into the muscle once. (Patient not taking: Reported on 07/27/2015) 2.5 mL 0   No current facility-administered medications for this visit.     ALLERGIES: Ace inhibitors and Penicillins  Family History  Problem Relation Age of Onset  . Hypertension Mother   . Diabetes Father   . Hypertension Father   . Ovarian cancer Maternal Grandmother   . Hypertension Maternal Grandmother   . Stroke Maternal Grandfather   . Diabetes Paternal Grandmother   . Hypertension Paternal Grandmother   . Alzheimer's disease Paternal Grandfather   . Cancer Maternal Aunt     dec bladder ca    Social History   Social History  . Marital Status: Married    Spouse Name: N/A  . Number of Children: 1  . Years of Education: 14   Occupational History  . Environmental consultant - [employer]    Social History Main Topics  . Smoking status: Never Smoker   . Smokeless tobacco: Never Used  . Alcohol Use: No  . Drug Use: No  . Sexual Activity:    Partners: Male    Birth Control/ Protection: Surgical     Comment: R-TLH/Bil.salingectomy   Other Topics Concern  . Not on file   Social History Narrative    ROS:  Pertinent items are noted in HPI.  PHYSICAL EXAMINATION:    BP 110/62 mmHg  Pulse 90  Ht 5\' 5"  (1.651 m)  Wt 244 lb (110.678 kg)  BMI 40.60 kg/m2  LMP 06/30/2015 (Approximate)    General appearance: alert, cooperative and appears stated age Abdomen: incision(s):Yes.  , __Steristrips and benzoin placed over the left _robotic trocar incision.  Abdomen is_________  soft, non-tender, no masses,  no organomegaly.  Normal active bowel sounds.   Chaperone was present for exam.  ASSESSMENT  Status post robotic TLH/bilateral salpingectomy/cystoscopy.  Post op anemia.  Hgb normal today. Doing well  overall.  Gas pains.   PLAN  Surgical findings and procedure reviewed. Stop narcotic use.  Explained how this can be contributing to her gas pain due to constipation side effects. Try Mylicon.  Continue decreased activity but frequent ambulation is recommended for DVT prophylaxis. OK to remove dermabond and steristrips in one week. Follow up for 6 week visit.    An After Visit Summary was printed and given to the patient.

## 2015-07-29 ENCOUNTER — Encounter: Payer: Self-pay | Admitting: Obstetrics and Gynecology

## 2015-08-05 LAB — HEMOGLOBIN, FINGERSTICK: HEMOGLOBIN, FINGERSTICK: 12.4 g/dL (ref 12.0–16.0)

## 2015-09-02 ENCOUNTER — Ambulatory Visit: Payer: BC Managed Care – PPO | Admitting: Obstetrics and Gynecology

## 2015-09-03 ENCOUNTER — Ambulatory Visit (INDEPENDENT_AMBULATORY_CARE_PROVIDER_SITE_OTHER): Payer: BC Managed Care – PPO | Admitting: Obstetrics and Gynecology

## 2015-09-03 ENCOUNTER — Encounter: Payer: Self-pay | Admitting: Obstetrics and Gynecology

## 2015-09-03 VITALS — BP 110/64 | HR 88 | Ht 65.0 in | Wt 241.8 lb

## 2015-09-03 DIAGNOSIS — Z9889 Other specified postprocedural states: Secondary | ICD-10-CM

## 2015-09-03 DIAGNOSIS — E876 Hypokalemia: Secondary | ICD-10-CM

## 2015-09-03 DIAGNOSIS — D649 Anemia, unspecified: Secondary | ICD-10-CM

## 2015-09-03 LAB — CBC
HEMATOCRIT: 37.5 % (ref 35.0–45.0)
HEMOGLOBIN: 12 g/dL (ref 11.7–15.5)
MCH: 26.2 pg — AB (ref 27.0–33.0)
MCHC: 32 g/dL (ref 32.0–36.0)
MCV: 81.9 fL (ref 80.0–100.0)
MPV: 10.8 fL (ref 7.5–12.5)
Platelets: 332 10*3/uL (ref 140–400)
RBC: 4.58 MIL/uL (ref 3.80–5.10)
RDW: 14.1 % (ref 11.0–15.0)
WBC: 7.7 10*3/uL (ref 3.8–10.8)

## 2015-09-03 LAB — BASIC METABOLIC PANEL
BUN: 9 mg/dL (ref 7–25)
CO2: 28 mmol/L (ref 20–31)
Calcium: 9.3 mg/dL (ref 8.6–10.2)
Chloride: 102 mmol/L (ref 98–110)
Creat: 0.88 mg/dL (ref 0.50–1.10)
GLUCOSE: 153 mg/dL — AB (ref 65–99)
POTASSIUM: 3.6 mmol/L (ref 3.5–5.3)
Sodium: 141 mmol/L (ref 135–146)

## 2015-09-03 NOTE — Progress Notes (Signed)
GYNECOLOGY  VISIT   HPI: 45 y.o.   Married  [place] American  female   [phone] with Patient's last menstrual period was 06/30/2015 (approximate).   here for 6 week follow up post ROBOTIC ASSISTED TOTAL HYSTERECTOMY WITH SALPINGECTOMY (Bilateral Abdomen) CYSTOSCOPY (N/A Bladder).  No vaginal bleeding.   Feels sluggish and not sure why.  Hx hypokalemia preop.  Has DM and is working on controlling this.  Also had anemia post op.   GYNECOLOGIC HISTORY: Patient's last menstrual period was 06/30/2015 (approximate). Contraception:  Hysterectomy Menopausal hormone therapy:  none2 Last mammogram:  06-30-15 Density C/poss.mass Lt.br, Rt.br.neg;Lt.Diag.mmg and US--resolution of mass and distortion/Neg/BiRads1:[facility] Last pap smear: 12-12-13 Neg:Neg HR HPV        OB History    Gravida Para Term Preterm AB Living   1 1 1     1    SAB TAB Ectopic Multiple Live Births                     Patient Active Problem List   Diagnosis Date Noted  . 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 Morbid obesity ([place]) 11/22/2012  . OSA on CPAP 11/22/2012  . HTN (hypertension) 11/22/2012  . DOE (dyspnea on exertion) 11/22/2012  . Abnormal EKG 11/22/2012    Past Medical History:  Diagnosis Date  . Abnormal Pap smear of cervix 2006   Leep, colpo  . Anxiety   . Diabetes mellitus without complication ([place])    type 2  . Fibroid   . GERD (gastroesophageal reflux disease)   . History of palpitations    PVCs - wore holter monitor, negative results  . Hypertension   . OSA (obstructive sleep apnea) 06/2010   AHI during total sleep 5.9/hr and REM sleep 28.1/hr (mild OSA) 06/2010, uses CPAP nightly  . Pre-diabetes   . Restless leg syndrome    patient denies this dx  . Seasonal allergies   . STD (sexually transmitted disease)    hx HPV  . SVD (spontaneous vaginal delivery)    x 1    Past Surgical History:  Procedure Laterality Date  . CERVICAL  BIOPSY  W/ LOOP ELECTRODE EXCISION  2006  . COLPOSCOPY  2006  . CYSTOSCOPY N/A 07/21/2015   Procedure: CYSTOSCOPY;  Surgeon: [name], MD;  Location: [facility];  Service: Gynecology;  Laterality: N/A;  . ROBOTIC ASSISTED TOTAL HYSTERECTOMY WITH SALPINGECTOMY Bilateral 07/21/2015   Procedure: ROBOTIC ASSISTED TOTAL HYSTERECTOMY WITH SALPINGECTOMY;  Surgeon: [name], MD;  Location: [facility];  Service: Gynecology;  Laterality: Bilateral;  . UPPER GI ENDOSCOPY    . WISDOM TOOTH EXTRACTION      Current Outpatient Prescriptions  Medication Sig Dispense Refill  . DULoxetine (CYMBALTA) 60 MG capsule Take 1 capsule by mouth daily before supper.     . fexofenadine (ALLEGRA) 180 MG tablet Take 180 mg by mouth daily as needed.     . GuaiFENesin (MUCINEX PO) Take by mouth as needed.    Marland Kitchen ibuprofen (ADVIL,MOTRIN) 600 MG tablet Take 1 tablet (600 mg total) by mouth every 6 (six) hours as needed (mild pain). 30 tablet 0  . Liraglutide (VICTOZA) 18 MG/3ML SOPN Inject 1.8 mg into the skin daily.     Marland Kitchen losartan-hydrochlorothiazide (HYZAAR) 100-25 MG tablet Take 1 tablet by mouth daily. Reported on 07/11/2015    . metFORMIN (GLUCOPHAGE) 500 MG tablet Take 1 tablet by mouth daily.    Marland Kitchen  montelukast (SINGULAIR) 10 MG tablet Take 10 mg by mouth at bedtime.     . Multiple Vitamin (MULTIVITAMIN) capsule Take 1 capsule by mouth daily.    Marland Kitchen omeprazole (PRILOSEC) 20 MG capsule Take 1 capsule by mouth daily.    . pneumococcal 23 valent vaccine (PNU-IMMUNE) 25 MCG/0.5ML injection Inject 0.5 mLs into the muscle once. 2.5 mL 0  . propranolol ER (INDERAL LA) 120 MG 24 hr capsule Take 1 capsule by mouth daily.    . SYMBICORT 160-4.5 MCG/ACT inhaler Inhale 1 puff into the lungs as needed.     . Vitamin D, Ergocalciferol, (DRISDOL) 50000 UNITS CAPS capsule Take 50,000 Units by mouth daily.     No current facility-administered medications for this visit.      ALLERGIES: Ace inhibitors and  Penicillins  Family History  Problem Relation Age of Onset  . Hypertension Mother   . Diabetes Father   . Hypertension Father   . Ovarian cancer Maternal Grandmother   . Hypertension Maternal Grandmother   . Stroke Maternal Grandfather   . Diabetes Paternal Grandmother   . Hypertension Paternal Grandmother   . Alzheimer's disease Paternal Grandfather   . Cancer Maternal Aunt     dec bladder ca    Social History   Social History  . Marital status: Married    Spouse name: N/A  . Number of children: 1  . Years of education: 63   Occupational History  . Environmental consultant - [employer]   Social History Main Topics  . Smoking status: Never Smoker  . Smokeless tobacco: Never Used  . Alcohol use No  . Drug use: No  . Sexual activity: Yes    Partners: Male    Birth control/ protection: Surgical     Comment: R-TLH/Bil.salingectomy   Other Topics Concern  . Not on file   Social History Narrative  . No narrative on file    ROS:  Pertinent items are noted in HPI.  PHYSICAL EXAMINATION:    BP 110/64 (BP Location: Right Arm, Patient Position: Sitting, Cuff Size: Large)   Pulse 88   Ht 5\' 5"  (1.651 m)   Wt 241 lb 12.8 oz (109.7 kg)   LMP 06/30/2015 (Approximate)   BMI 40.24 kg/m     General appearance: alert, cooperative and appears stated age   Abdomen: incisions intact, soft, non-tender, no masses,  no organomegaly    Pelvic: External genitalia:  no lesions              Urethra:  normal appearing urethra with no masses, tenderness or lesions              Bartholins and Skenes: normal                 Vagina:  Vicryl suture of the right and left vaginal sidewall.              Cervix:  Absent.                 Bimanual Exam:  Uterus:   Absent.              Adnexa: no mass, fullness, tenderness     Chaperone was present for exam.  ASSESSMENT  Status post robotic TLH, bilateral salpingectomy, cystoscopy and suturing of vaginal sidewalls.  Hypokalemia  preop.  Anemia post op. DM.   PLAN  Check CBC, BMP.  Return to all normal activity in 2 weeks and go slowly.  Annual  exam in December 2017.     An After Visit Summary was printed and given to the patient.

## 2016-01-07 ENCOUNTER — Telehealth: Payer: Self-pay | Admitting: Obstetrics and Gynecology

## 2016-01-07 DIAGNOSIS — L918 Other hypertrophic disorders of the skin: Secondary | ICD-10-CM

## 2016-01-07 NOTE — Telephone Encounter (Signed)
Call to patient. Patient requesting to have removal of one skin tag on right upper leg. Patient asking to know how much this will cost with her insurance. Informed patient Krista [name] will precert this for her and call to review benefits. Patient agreeable. RN also advised patient RN would check with Dr. Quincy Simmonds to see if this was something that could be done at her [facility], which is scheduled on 01/29/16, or if she needed a separate appointment. Patient agreeable.    Routing to provider for review.   Cc [name]

## 2016-01-07 NOTE — Telephone Encounter (Signed)
Patient wants to schedule an appointment for a skin tag removal with Dr. Quincy Simmonds.

## 2016-01-08 NOTE — Telephone Encounter (Signed)
I think we previously precerted removal of the skin tag.  I am not sure if this can be done on the same day from an insurance standpoint.  Will send through to [facility] to check.   Cc- [name]

## 2016-01-11 ENCOUNTER — Ambulatory Visit: Payer: BC Managed Care – PPO | Admitting: Obstetrics and Gynecology

## 2016-01-18 NOTE — Telephone Encounter (Signed)
Spoke with patient regarding benefit for skin tag removal. Patient understood and agreeable to having procedure the same day as her annual exam. Patient scheduled 01/29/16 with Dr Quincy Simmonds. Patient aware of date,arrival time and cancellation policy. Patient has no further questions.   Routing to Dr Quincy Simmonds for final review

## 2016-01-27 NOTE — Progress Notes (Signed)
45 y.o. G53P1001 Married [place] American female here for annual exam.     Feels the best she has felt in a long time. Had hysterectomy and trying to lose weight.   Some sweats.  Thinks it may be due to DM.  No night sweats.   Some minor burning with urination occasionally.   Wishes for skin tag removal. One on right thigh, one on right vulva.  PCP: [name], MD    Patient's last menstrual period was 06/30/2015 (approximate).           Sexually active: Yes.   female The current method of family planning is Hysterectomy.   Still has ovaries. Exercising: Yes.    Walks 2-3x/week Smoker:  no  Health Maintenance: Pap:  01-09-15 Ascus:Neg HR HPV History of abnormal Pap:  Yes, 2006 colposcopy and possible LEEP procedure. Paps normal since.  Final pathology of uterine cervix was benign. MMG:  06-30-15 Density C/Poss.mass Lt.Br, Rt.Br.neg--07-13-15 Lt.Diag.and Lt.U/S reveal resolution of the mass and distortion of concern in the lateral left breast consistent with overlapping fibroglandular tissue/screening 4yr/BiRads1:TBC Colonoscopy:  n/a BMD:   n/a  Result  n/a TDaP:  2013 Gardasil:   N/A Had flu vaccine.    Screening Labs:  Hb today: PCP, Urine today: Neg   reports that she has never smoked. She has never used smokeless tobacco. She reports that she does not drink alcohol or use drugs.  Past Medical History:  Diagnosis Date  . Abnormal Pap smear of cervix 2006   Leep, colpo  . Anxiety   . Diabetes mellitus without complication ([place])    type 2  . Fibroid   . GERD (gastroesophageal reflux disease)   . History of palpitations    PVCs - wore holter monitor, negative results  . Hypertension   . OSA (obstructive sleep apnea) 06/2010   AHI during total sleep 5.9/hr and REM sleep 28.1/hr (mild OSA) 06/2010, uses CPAP nightly  . Pre-diabetes   . Restless leg syndrome    patient denies this dx  . Seasonal allergies   . STD (sexually transmitted disease)    hx HPV  . SVD  (spontaneous vaginal delivery)    x 1    Past Surgical History:  Procedure Laterality Date  . CERVICAL BIOPSY  W/ LOOP ELECTRODE EXCISION  2006  . COLPOSCOPY  2006  . CYSTOSCOPY N/A 07/21/2015   Procedure: CYSTOSCOPY;  Surgeon: [name], MD;  Location: [facility];  Service: Gynecology;  Laterality: N/A;  . ROBOTIC ASSISTED TOTAL HYSTERECTOMY WITH SALPINGECTOMY Bilateral 07/21/2015   Procedure: ROBOTIC ASSISTED TOTAL HYSTERECTOMY WITH SALPINGECTOMY;  Surgeon: [name], MD;  Location: [facility];  Service: Gynecology;  Laterality: Bilateral;  . UPPER GI ENDOSCOPY    . WISDOM TOOTH EXTRACTION      Current Outpatient Prescriptions  Medication Sig Dispense Refill  . DULoxetine (CYMBALTA) 60 MG capsule Take 1 capsule by mouth daily before supper.     . fexofenadine (ALLEGRA) 180 MG tablet Take 180 mg by mouth daily as needed.     . GuaiFENesin (MUCINEX PO) Take by mouth as needed.    Marland Kitchen ibuprofen (ADVIL,MOTRIN) 600 MG tablet Take 1 tablet (600 mg total) by mouth every 6 (six) hours as needed (mild pain). 30 tablet 0  . Liraglutide (VICTOZA) 18 MG/3ML SOPN Inject 1.8 mg into the skin daily.     Marland Kitchen losartan-hydrochlorothiazide (HYZAAR) 100-25 MG tablet Take 1 tablet by mouth daily. Reported on 07/11/2015    .  metFORMIN (GLUCOPHAGE) 500 MG tablet Take 1 tablet by mouth daily.    . montelukast (SINGULAIR) 10 MG tablet Take 10 mg by mouth at bedtime.     . Multiple Vitamin (MULTIVITAMIN) capsule Take 1 capsule by mouth daily.    Marland Kitchen omeprazole (PRILOSEC) 20 MG capsule Take 1 capsule by mouth daily.    [name] VERIO test strip     . pneumococcal 23 valent vaccine (PNU-IMMUNE) 25 MCG/0.5ML injection Inject 0.5 mLs into the muscle once. 2.5 mL 0  . propranolol ER (INDERAL LA) 120 MG 24 hr capsule Take 1 capsule by mouth daily.    . SYMBICORT 160-4.5 MCG/ACT inhaler Inhale 1 puff into the lungs as needed.     . Vitamin D, Ergocalciferol, (DRISDOL) 50000 UNITS CAPS capsule Take  50,000 Units by mouth daily.     No current facility-administered medications for this visit.     Family History  Problem Relation Age of Onset  . Hypertension Mother   . Diabetes Father   . Hypertension Father   . Ovarian cancer Maternal Grandmother   . Hypertension Maternal Grandmother   . Stroke Maternal Grandfather   . Diabetes Paternal Grandmother   . Hypertension Paternal Grandmother   . Alzheimer's disease Paternal Grandfather   . Cancer Maternal Aunt     dec bladder ca    ROS:  Pertinent items are noted in HPI.  Otherwise, a comprehensive ROS was negative.  Exam:   BP 128/84 (BP Location: Right Arm, Patient Position: Sitting, Cuff Size: Large)   Pulse 84   Resp 16   Ht 5' 5.5" (1.664 m)   Wt 246 lb 3.2 oz (111.7 kg)   LMP 06/30/2015 (Approximate)   BMI 40.35 kg/m     General appearance: alert, cooperative and appears stated age Head: Normocephalic, without obvious abnormality, atraumatic Neck: no adenopathy, supple, symmetrical, trachea midline and thyroid normal to inspection and palpation Lungs: clear to auscultation bilaterally Breasts: normal appearance, no masses or tenderness, No nipple retraction or dimpling, No nipple discharge or bleeding, No axillary or supraclavicular adenopathy Heart: regular rate and rhythm Abdomen:  obese, llaparoscopic incisions, soft, non-tender; no masses, no organomegaly Extremities: extremities normal, atraumatic, no cyanosis or edema Skin: Skin color, texture, turgor normal. No rashes or lesions.  Right medial thigh skin tag 7 mm and on thin stalk.  Lymph nodes: Cervical, supraclavicular, and axillary nodes normal. No abnormal inguinal nodes palpated Neurologic: Grossly normal  Pelvic: External genitalia:  Right vulva with 4 mm skin tag.                Urethra:  normal appearing urethra with no masses, tenderness or lesions              Bartholins and Skenes: normal                 Vagina: normal appearing vagina with normal  color and discharge, no lesions              Cervix:  Absent.              Pap taken: No. Bimanual Exam:  Uterus:   Absent.              Adnexa: no mass, fullness, tenderness              Rectal exam: Yes.  .  Confirms.              Anus:  normal sphincter tone, no lesions  Procedure Removal of skin tags of the right vulva and right thigh.  Consent for procedure.  Betadine prep.  Sterile prep with 1% lidocaine, lot [id number], exp 02/21. Sharp excision with scissors.   Each to pathology separately.  AgNO3 used.  Small bandaids placed.  No complications.  Minimal EBL.  Chaperone was present for exam.  Assessment:   Well woman visit with normal exam. Status post robotic TLH.  Ovaries remain.  FH ovarian cancer.  Skin tags removed. DM.  Plan: Mammogram screening discussed. Recommended self breast awareness. Pap and HR HPV as above. Guidelines for Calcium, Vitamin D, regular exercise program including cardiovascular and weight bearing exercise. Follow up pathology reports.   Encouraged weight loss.  Follow up annually and prn.      After visit summary provided.

## 2016-01-29 ENCOUNTER — Encounter: Payer: Self-pay | Admitting: Obstetrics and Gynecology

## 2016-01-29 ENCOUNTER — Ambulatory Visit (INDEPENDENT_AMBULATORY_CARE_PROVIDER_SITE_OTHER): Payer: BC Managed Care – PPO | Admitting: Obstetrics and Gynecology

## 2016-01-29 VITALS — BP 128/84 | HR 84 | Resp 16 | Ht 65.5 in | Wt 246.2 lb

## 2016-01-29 DIAGNOSIS — L918 Other hypertrophic disorders of the skin: Secondary | ICD-10-CM | POA: Diagnosis not present

## 2016-01-29 DIAGNOSIS — Z01419 Encounter for gynecological examination (general) (routine) without abnormal findings: Secondary | ICD-10-CM | POA: Diagnosis not present

## 2016-01-29 DIAGNOSIS — Z0000 Encounter for general adult medical examination without abnormal findings: Secondary | ICD-10-CM | POA: Diagnosis not present

## 2016-01-29 LAB — POCT URINALYSIS DIPSTICK
Bilirubin, UA: NEGATIVE
Blood, UA: NEGATIVE
Glucose, UA: NEGATIVE
Ketones, UA: NEGATIVE
Leukocytes, UA: NEGATIVE
Nitrite, UA: NEGATIVE
Protein, UA: NEGATIVE
Urobilinogen, UA: NEGATIVE
pH, UA: 6

## 2016-01-29 NOTE — Patient Instructions (Signed)

## 2016-02-03 LAB — IPS OTHER TISSUE BIOPSY

## 2016-07-21 ENCOUNTER — Other Ambulatory Visit: Payer: Self-pay | Admitting: Internal Medicine

## 2016-07-21 ENCOUNTER — Other Ambulatory Visit: Payer: Self-pay | Admitting: Obstetrics and Gynecology

## 2016-07-21 DIAGNOSIS — Z1231 Encounter for screening mammogram for malignant neoplasm of breast: Secondary | ICD-10-CM

## 2016-08-01 ENCOUNTER — Ambulatory Visit: Payer: BC Managed Care – PPO

## 2016-08-09 ENCOUNTER — Ambulatory Visit
Admission: RE | Admit: 2016-08-09 | Discharge: 2016-08-09 | Disposition: A | Discharge status: Home / Self Care | Payer: BC Managed Care – PPO | Source: Ambulatory Visit | Attending: Internal Medicine | Admitting: Internal Medicine

## 2016-08-09 DIAGNOSIS — Z1231 Encounter for screening mammogram for malignant neoplasm of breast: Secondary | ICD-10-CM

## 2017-02-10 ENCOUNTER — Ambulatory Visit: Payer: BC Managed Care – PPO | Admitting: Obstetrics and Gynecology

## 2017-03-06 ENCOUNTER — Ambulatory Visit: Payer: BC Managed Care – PPO | Admitting: Obstetrics and Gynecology

## 2017-04-05 ENCOUNTER — Ambulatory Visit: Payer: BC Managed Care – PPO | Admitting: Obstetrics and Gynecology

## 2017-04-05 ENCOUNTER — Encounter: Payer: Self-pay | Admitting: Obstetrics and Gynecology

## 2017-04-05 ENCOUNTER — Other Ambulatory Visit: Payer: Self-pay

## 2017-04-05 VITALS — BP 126/80 | HR 84 | Resp 16 | Ht 64.75 in | Wt 247.0 lb

## 2017-04-05 DIAGNOSIS — Z1211 Encounter for screening for malignant neoplasm of colon: Secondary | ICD-10-CM

## 2017-04-05 DIAGNOSIS — Z01419 Encounter for gynecological examination (general) (routine) without abnormal findings: Secondary | ICD-10-CM | POA: Diagnosis not present

## 2017-04-05 NOTE — Patient Instructions (Signed)

## 2017-04-05 NOTE — Progress Notes (Signed)
47 y.o. G25P1001 Married [place] American female here for annual exam.    Happy to not having menses.  Some increased night sweats and difficulty sleeping but doing ok.   Anxiety controlled on Cymbalta.  HgbA1C is in the 5 or 6 range.   PCP: Dr. Rica Records    Patient's last menstrual period was 07/02/2015 (approximate).           Sexually active: Yes.    The current method of family planning is Hysterectomy -- ovaries remain.    Exercising: Yes.    walking and the gym Smoker:  no  Health Maintenance: Pap:  01-09-15 Ascus:Neg HR HPV History of abnormal Pap:  Yes, 2006 colposcopy and possible LEEP procedure. Paps normal since.  Final pathology of uterine cervix was benign. MMG:  08/09/16 BIRADS 1 negative/density b Colonoscopy:  n/a BMD:   n/a  Result  n/a TDaP:  2013 Gardasil:   n/a HIV and Hep C: donated blood in past Screening Labs:  PCP   reports that  has never smoked. she has never used smokeless tobacco. She reports that she does not drink alcohol or use drugs.  Past Medical History:  Diagnosis Date  . Abnormal Pap smear of cervix 2006   Leep, colpo  . Anxiety   . Diabetes mellitus without complication ([place])    type 2  . Fibroid   . GERD (gastroesophageal reflux disease)   . History of palpitations    PVCs - wore holter monitor, negative results  . Hypertension   . OSA (obstructive sleep apnea) 06/2010   AHI during total sleep 5.9/hr and REM sleep 28.1/hr (mild OSA) 06/2010, uses CPAP nightly  . Pre-diabetes   . Restless leg syndrome    patient denies this dx  . Seasonal allergies   . STD (sexually transmitted disease)    hx HPV  . SVD (spontaneous vaginal delivery)    x 1    Past Surgical History:  Procedure Laterality Date  . CERVICAL BIOPSY  W/ LOOP ELECTRODE EXCISION  2006  . COLPOSCOPY  2006  . CYSTOSCOPY N/A 07/21/2015   Procedure: CYSTOSCOPY;  Surgeon: [name], MD;  Location: [facility];  Service: Gynecology;  Laterality: N/A;  . ROBOTIC  ASSISTED TOTAL HYSTERECTOMY WITH SALPINGECTOMY Bilateral 07/21/2015   Procedure: ROBOTIC ASSISTED TOTAL HYSTERECTOMY WITH SALPINGECTOMY;  Surgeon: [name], MD;  Location: [facility];  Service: Gynecology;  Laterality: Bilateral;  . UPPER GI ENDOSCOPY    . WISDOM TOOTH EXTRACTION      Current Outpatient Medications  Medication Sig Dispense Refill  . DULoxetine (CYMBALTA) 60 MG capsule Take 1 capsule by mouth daily before supper.     . fexofenadine (ALLEGRA) 180 MG tablet Take 180 mg by mouth daily as needed.     . GuaiFENesin (MUCINEX PO) Take by mouth as needed.    Marland Kitchen ibuprofen (ADVIL,MOTRIN) 600 MG tablet Take 1 tablet (600 mg total) by mouth every 6 (six) hours as needed (mild pain). 30 tablet 0  . Liraglutide (VICTOZA) 18 MG/3ML SOPN Inject 1.8 mg into the skin daily.     Marland Kitchen losartan-hydrochlorothiazide (HYZAAR) 100-25 MG tablet Take 1 tablet by mouth daily. Reported on 07/11/2015    . metFORMIN (GLUCOPHAGE) 500 MG tablet Take 1 tablet by mouth daily.    . montelukast (SINGULAIR) 10 MG tablet Take 10 mg by mouth at bedtime.     . Multiple Vitamin (MULTIVITAMIN) capsule Take 1 capsule by mouth daily.    Marland Kitchen  omeprazole (PRILOSEC) 20 MG capsule Take 1 capsule by mouth daily.    [name] VERIO test strip     . propranolol ER (INDERAL LA) 120 MG 24 hr capsule Take 1 capsule by mouth daily.    . SYMBICORT 160-4.5 MCG/ACT inhaler Inhale 1 puff into the lungs as needed.     . Vitamin D, Ergocalciferol, (DRISDOL) 50000 UNITS CAPS capsule Take 50,000 Units by mouth daily.     No current facility-administered medications for this visit.     Family History  Problem Relation Age of Onset  . Hypertension Mother   . Diabetes Father   . Hypertension Father   . Ovarian cancer Maternal Grandmother   . Hypertension Maternal Grandmother   . Stroke Maternal Grandfather   . Diabetes Paternal Grandmother   . Hypertension Paternal Grandmother   . Alzheimer's disease Paternal Grandfather   .  Cancer Maternal Aunt        dec bladder ca  . Breast cancer Paternal Aunt   . Breast cancer Cousin     ROS:  Pertinent items are noted in HPI.  Otherwise, a comprehensive ROS was negative.  Exam:   BP 126/80 (BP Location: Right Arm, Patient Position: Sitting, Cuff Size: Large)   Pulse 84   Resp 16   Ht 5' 4.75" (1.645 m)   Wt 247 lb (112 kg)   LMP 07/02/2015 (Approximate)   BMI 41.42 kg/m     General appearance: alert, cooperative and appears stated age Head: Normocephalic, without obvious abnormality, atraumatic Neck: no adenopathy, supple, symmetrical, trachea midline and thyroid normal to inspection and palpation Lungs: clear to auscultation bilaterally Breasts: normal appearance, no masses or tenderness, No nipple retraction or dimpling, No nipple discharge or bleeding, No axillary or supraclavicular adenopathy Heart: regular rate and rhythm Abdomen: soft, non-tender; no masses, no organomegaly Extremities: extremities normal, atraumatic, no cyanosis or edema Skin: Skin color, texture, turgor normal. No rashes or lesions Lymph nodes: Cervical, supraclavicular, and axillary nodes normal. No abnormal inguinal nodes palpated Neurologic: Grossly normal  Pelvic: External genitalia:  no lesions              Urethra:  normal appearing urethra with no masses, tenderness or lesions              Bartholins and Skenes: normal                 Vagina: normal appearing vagina with normal color and discharge, no lesions              Cervix: absent              Pap taken: No. Bimanual Exam:  Uterus:   negative              Adnexa: no mass, fullness, tenderness              Rectal exam: Yes.  .  Confirms.              Anus:  normal sphincter tone, no lesions  Chaperone was present for exam.  Assessment:   Well woman visit with normal exam. Status post robotic TLH/bilateral salpingectomy.  Ovaries remain.  FH ovarian cancer.  MGM. FH breast cancer.  Paternal aunt and cousin. DM.   HTN.  Anxiety.  On Cymbalta.  Plan: Mammogram screening discussed. Recommended self breast awareness. Pap and HR HPV as above. Guidelines for Calcium, Vitamin D, regular exercise program including cardiovascular and weight bearing exercise. IFOB. Follow up  annually and prn.   After visit summary provided.

## 2017-05-01 LAB — FECAL OCCULT BLOOD, IMMUNOCHEMICAL: IFOBT: NEGATIVE

## 2017-07-10 ENCOUNTER — Telehealth: Payer: Self-pay | Admitting: Internal Medicine

## 2017-07-10 NOTE — Telephone Encounter (Signed)
New Message:     Pt says she really needs a new C-Pap machine. She was told she would need an order.Dr Debara Pickett sent her to [facility] for her study.Who does she need to contact to get a new machine?

## 2017-07-10 NOTE — Telephone Encounter (Signed)
Please schedule appointment to see Dr Claiborne Billings for broken CPAP machine. Does not need to be a sleep clinic day.

## 2017-07-10 NOTE — Telephone Encounter (Signed)
Patient cannot just get a prescription. She needs appointment with Dr Claiborne Billings. If she is having a problem with her machine it has to be documented by the MD that it's non functional. Either way she will need a appointment. I don't see where she has been seen recently by Dr Claiborne Billings.

## 2017-07-10 NOTE — Telephone Encounter (Signed)
Please contact the patient with an appt.

## 2017-07-20 ENCOUNTER — Ambulatory Visit: Payer: BC Managed Care – PPO | Admitting: Cardiovascular Disease

## 2017-07-25 ENCOUNTER — Other Ambulatory Visit: Payer: Self-pay | Admitting: Internal Medicine

## 2017-07-25 DIAGNOSIS — Z1231 Encounter for screening mammogram for malignant neoplasm of breast: Secondary | ICD-10-CM

## 2017-08-07 NOTE — Progress Notes (Signed)
Cardiology Office Note   Date:  08/08/2017   ID:  Krista [name], DOB 1970/03/16, MRN [id number]  PCP:  Krista Johns, MD  Cardiologist:  Dr. Debara [name]  Chief Complaint  Patient presents with  . Follow-up     History of Present Illness: Krista [name] is a 47 y.o. 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 She was placed on a non-selective BB, propanolol, 120 mg daily,  due to additional diagnosis of anxiety.   Follow up echocardiogram was normal, with low risk stress test. Due to hypertension, losartan was increased to HCTZ 100/12.5 mg. When last seen by Dr. Debara [name] on 12/26/2012 she was doing well and was without complaints.   She is here today due to ongoing DOE, fatigue during they day and history of OSA but not on CPAP for over a year due to CPAP machine malfunction. She denies chest pain, dizziness. She continues to struggle with obesity, but has low energy to exercise due to daytime fatigue.   Past Medical History:  Diagnosis Date  . Abnormal Pap smear of cervix 2006   Leep, colpo  . Anxiety   . Diabetes mellitus without complication ([place])    type 2  . Fibroid   . GERD (gastroesophageal reflux disease)   . History of palpitations    PVCs - wore holter monitor, negative results  . Hypertension   . OSA (obstructive sleep apnea) 06/2010   AHI during total sleep 5.9/hr and REM sleep 28.1/hr (mild OSA) 06/2010, uses CPAP nightly  . Pre-diabetes   . Restless leg syndrome    patient denies this dx  . Seasonal allergies   . STD (sexually transmitted disease)    hx HPV  . SVD (spontaneous vaginal delivery)    x 1    Past Surgical History:  Procedure Laterality Date  . CERVICAL BIOPSY  W/ LOOP ELECTRODE EXCISION  2006  . COLPOSCOPY  2006  . CYSTOSCOPY N/A 07/21/2015   Procedure: CYSTOSCOPY;  Surgeon: Krista Cobbs, MD;  Location: [facility];  Service: Gynecology;  Laterality: N/A;  . ROBOTIC ASSISTED TOTAL HYSTERECTOMY WITH SALPINGECTOMY Bilateral 07/21/2015   Procedure: ROBOTIC ASSISTED TOTAL HYSTERECTOMY WITH SALPINGECTOMY;  Surgeon: Krista Cobbs, MD;  Location: [facility];  Service: Gynecology;  Laterality: Bilateral;  . UPPER GI ENDOSCOPY    . WISDOM TOOTH EXTRACTION       Current Outpatient Medications  Medication Sig Dispense Refill  . DULoxetine (CYMBALTA) 60 MG capsule Take 1 capsule by mouth daily before supper.     . fexofenadine (ALLEGRA) 180 MG tablet Take 180 mg by mouth daily as needed.     . GuaiFENesin (MUCINEX PO) Take by mouth as needed.    Marland Kitchen ibuprofen (ADVIL,MOTRIN) 600 MG tablet Take 1 tablet (600 mg total) by mouth every 6 (six) hours as needed (mild pain). 30 tablet 0  . Liraglutide (VICTOZA) 18 MG/3ML SOPN Inject 1.8 mg into the skin daily.     Marland Kitchen losartan-hydrochlorothiazide (HYZAAR) 100-25 MG tablet Take 1 tablet by mouth daily. Reported on 07/11/2015    . metFORMIN (GLUCOPHAGE) 500 MG tablet Take 1 tablet by mouth daily.    . montelukast (SINGULAIR) 10 MG tablet Take 10 mg by mouth at bedtime.     . Multiple Vitamin (MULTIVITAMIN) capsule Take 1 capsule by mouth daily.    Marland Kitchen  omeprazole (PRILOSEC) 20 MG capsule Take 1 capsule by mouth daily.    Krista [name] VERIO test strip     . propranolol ER (INDERAL LA) 120 MG 24 hr capsule Take 1 capsule by mouth daily.    . SYMBICORT 160-4.5 MCG/ACT inhaler Inhale 1 puff into the lungs as needed.     . Vitamin D, Ergocalciferol, (DRISDOL) 50000 UNITS CAPS capsule Take 50,000 Units by mouth daily.     No current facility-administered medications for this visit.     Allergies:   Ace inhibitors and Penicillins    Social History:  The patient  reports that she has never smoked. She has never used smokeless tobacco. She reports that she does not drink alcohol or use drugs.   Family History:  The patient's family  history includes Alzheimer's disease in her paternal grandfather; Breast cancer in her cousin and paternal aunt; Cancer in her maternal aunt; Diabetes in her father and paternal grandmother; Hypertension in her father, maternal grandmother, mother, and paternal grandmother; Ovarian cancer in her maternal grandmother; Stroke in her maternal grandfather.    ROS: All other systems are reviewed and negative. Unless otherwise mentioned in H&P    PHYSICAL EXAM: VS:  LMP 07/02/2015 (Approximate)  , BMI There is no height or weight on file to calculate BMI. GEN: Well nourished, well developed, in no acute distress  HEENT: normal  Neck: no JVD, carotid bruits, or masses Cardiac: RRR; no murmurs, rubs, or gallops,no edema  Respiratory:  clear to auscultation bilaterally, normal work of breathing GI: soft, nontender, nondistended, + BS MS: no deformity or atrophy  Skin: warm and dry, no rash Neuro:  Strength and sensation are intact Psych: euthymic mood, full affect   EKG: NSR, rate of 82 bpm, non-specific T-wave abnormality. QT interval 394 ms.   Recent Labs: No results found for requested labs within last 8760 hours.    Lipid Panel No results found for: CHOL, TRIG, HDL, CHOLHDL, VLDL, LDLCALC, LDLDIRECT    Wt Readings from Last 3 Encounters:  04/05/17 247 lb (112 kg)  01/29/16 246 lb 3.2 oz (111.7 kg)  09/03/15 241 lb 12.8 oz (109.7 kg)    Other studies Review Echocardiogram 12/11/2012 Left ventricle: The cavity size was normal. Systolic function was normal. The estimated ejection fraction was in the range of 55% to 60%. Wall motion was normal; there were no regional wall motion abnormalities. Left ventricular diastolic function parameters were normal.  ASSESSMENT AND PLAN:  1. OSA: Prior history of same but has been unable to use CPAP machine due to malfunction. Has been off of CPAP for over a year and has had worsening symptoms of fatigue, DOE, and daytime sleepiness. She  states her snoring has gotten worse. Will plan repeat sleep study for re-evaluation of OSA burden to begin CPAP again with updated machine and parameters. She will follow up with Dr. Claiborne Billings for management.   2. Hypertension: Currently controlled on ARB/HCTZ. No changes in regimen. She has follow up labs per her PCP who she sees every 3 months.   3. Diabetes: She is followed by PCP and states that this is well controlled.    Current medicines are reviewed at length with the patient today.    Labs/ tests ordered today include: Sleep Study   Phill Myron. West Pugh, ANP, AACC   08/08/2017 9:01 AM    [facility] [address] [place],  [address] Phone: [phone]; Fax: [phone]

## 2017-08-08 ENCOUNTER — Ambulatory Visit: Payer: BC Managed Care – PPO | Admitting: Adult Health

## 2017-08-08 ENCOUNTER — Encounter: Payer: Self-pay | Admitting: Adult Health

## 2017-08-08 VITALS — BP 136/88 | HR 82 | Ht 64.75 in | Wt 247.0 lb

## 2017-08-08 DIAGNOSIS — Z9989 Dependence on other enabling machines and devices: Secondary | ICD-10-CM

## 2017-08-08 DIAGNOSIS — G4733 Obstructive sleep apnea (adult) (pediatric): Secondary | ICD-10-CM | POA: Diagnosis not present

## 2017-08-08 DIAGNOSIS — I493 Ventricular premature depolarization: Secondary | ICD-10-CM

## 2017-08-08 DIAGNOSIS — I10 Essential (primary) hypertension: Secondary | ICD-10-CM | POA: Diagnosis not present

## 2017-08-08 NOTE — Patient Instructions (Addendum)
Medication Instructions:  NO CHANGES- Your physician recommends that you continue on your current medications as directed. Please refer to the Current Medication list given to you today.  If you need a refill on your cardiac medications before your next appointment, please call your pharmacy.  Special Instructions: Your physician has recommended that you have a home sleep study. This test records several body functions during sleep, including: brain activity, eye movement, oxygen and carbon dioxide blood levels, heart rate and rhythm, breathing rate and rhythm, the flow of air through your mouth and nose, snoring, body muscle movements, and chest and belly movement.    Thank you for choosing [facility] at [facility]!!

## 2017-08-09 ENCOUNTER — Telehealth: Payer: Self-pay | Admitting: *Deleted

## 2017-08-09 NOTE — Telephone Encounter (Signed)
-----   Message from Lauralee Evener, [place] sent at 08/08/2017 12:47 PM EDT ----- HST PRE CERT

## 2017-08-09 NOTE — Telephone Encounter (Signed)
Pre cert submitted to [facility] for HST.

## 2017-08-16 ENCOUNTER — Telehealth: Payer: Self-pay | Admitting: Cardiovascular Disease

## 2017-08-16 NOTE — Telephone Encounter (Signed)
Returned a call to patient. Left message I am waiting to hear back from her insurance company. I will contact her once I have heard from [facility].

## 2017-08-16 NOTE — Telephone Encounter (Signed)
New Message:    Pt said she was waiting to find out when she can do her Sleep Study.

## 2017-08-17 ENCOUNTER — Telehealth: Payer: Self-pay | Admitting: *Deleted

## 2017-08-17 NOTE — Telephone Encounter (Signed)
Left HST appointment detail on patient's cell VM. Verbal ok via prior phone conversation.

## 2017-08-17 NOTE — Telephone Encounter (Signed)
-----   Message from Lauralee Evener, [place] sent at 08/08/2017 12:47 PM EDT ----- HST PRE CERT

## 2017-08-18 ENCOUNTER — Ambulatory Visit (HOSPITAL_BASED_OUTPATIENT_CLINIC_OR_DEPARTMENT_OTHER): Payer: BC Managed Care – PPO | Attending: Cardiovascular Disease | Admitting: Cardiovascular Disease

## 2017-08-18 ENCOUNTER — Ambulatory Visit
Admission: RE | Admit: 2017-08-18 | Discharge: 2017-08-18 | Disposition: A | Discharge status: Home / Self Care | Payer: BC Managed Care – PPO | Source: Ambulatory Visit | Attending: Internal Medicine | Admitting: Internal Medicine

## 2017-08-18 DIAGNOSIS — G4733 Obstructive sleep apnea (adult) (pediatric): Secondary | ICD-10-CM | POA: Diagnosis not present

## 2017-08-18 DIAGNOSIS — Z9989 Dependence on other enabling machines and devices: Secondary | ICD-10-CM

## 2017-08-18 DIAGNOSIS — Z1231 Encounter for screening mammogram for malignant neoplasm of breast: Secondary | ICD-10-CM

## 2017-08-28 ENCOUNTER — Encounter (HOSPITAL_BASED_OUTPATIENT_CLINIC_OR_DEPARTMENT_OTHER): Payer: Self-pay | Admitting: Cardiovascular Disease

## 2017-08-28 ENCOUNTER — Telehealth: Payer: Self-pay | Admitting: Cardiovascular Disease

## 2017-08-28 ENCOUNTER — Other Ambulatory Visit: Payer: Self-pay | Admitting: Cardiovascular Disease

## 2017-08-28 DIAGNOSIS — G4733 Obstructive sleep apnea (adult) (pediatric): Secondary | ICD-10-CM

## 2017-08-28 NOTE — Telephone Encounter (Signed)
New message   Patient calling for sleep study results

## 2017-08-28 NOTE — Progress Notes (Signed)
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 She wants to do the titration at home to save time. She states that she already knows that she has OSA and just needs to get back on her treatment before school starts.  She is declining to go to the lab for titration. Message will be sent to Dr Claiborne Billings for review and further recommendations.

## 2017-08-28 NOTE — Procedures (Signed)
     Patient Name: Krista [name], Krista [name] Date: 08/18/2017 Gender: Female D.O.B: [birth year] Age (years): 47 Referring Provider: Jory Sims NP Height (inches): 64 Interpreting Physician: Shelva Majestic MD, ABSM Weight (lbs): 247 RPSGT: Jonna Coup BMI: 42 MRN: [id number] Neck Size: 17.50  CLINICAL INFORMATION Sleep Study Type: HST  Indication for sleep study: OSA  Epworth Sleepiness Score: 16  SLEEP STUDY TECHNIQUE A multi-channel overnight portable sleep study was performed. The channels recorded were: nasal airflow, thoracic respiratory movement, and oxygen saturation with a pulse oximetry. Snoring was also monitored.  MEDICATIONS     DULoxetine (CYMBALTA) 60 MG capsule             fexofenadine (ALLEGRA) 180 MG tablet         glimepiride (AMARYL) 2 MG tablet         Liraglutide (VICTOZA) 18 MG/3ML SOPN         losartan-hydrochlorothiazide (HYZAAR) 100-25 MG tablet         montelukast (SINGULAIR) 10 MG tablet         Multiple Vitamin (MULTIVITAMIN) capsule         pantoprazole (PROTONIX) 40 MG tablet         propranolol ER (INDERAL LA) 120 MG 24 hr capsule         SYMBICORT 160-4.5 MCG/ACT inhaler         Vitamin D, Ergocalciferol, (DRISDOL) 50000 UNITS CAPS capsule      Patient self administered medications include: N/A.  SLEEP ARCHITECTURE Patient was studied for 569.5 minutes. The sleep efficiency was 98.3 % and the patient was supine for 49.8%. The arousal index was 0.0 per hour.  RESPIRATORY PARAMETERS The overall AHI was 14.4 per hour, with a central apnea index of 0.0 per hour.  The oxygen nadir was 84% during sleep.  CARDIAC DATA Mean heart rate during sleep was 81.8 bpm.  IMPRESSIONS - Mild -moderate obstructive sleep apnea (AHI 14.4/h).  Events were worse with supine sleep with AHI 22.0 vs non-supine AHI 6.9/h. - No significant central sleep apnea occurred during this study (CAI = 0.0/h). - Moderate oxygen desaturation to a nadir 84%). -  Patient snored 53.7% during the sleep.  DIAGNOSIS - Obstructive Sleep Apnea (327.23 [G47.33 ICD-10])  RECOMMENDATIONS - Therapeutic CPAP titration to determine optimal pressure required to alleviate sleep disordered breathing. - Efforts should be made to optimize nasal and oropharyngeal patency. - Positional therapy avoiding supine position during sleep. - Avoid alcohol, sedatives and other CNS depressants that may worsen sleep apnea and disrupt normal sleep architecture. - Sleep hygiene should be reviewed to assess factors that may improve sleep quality. - Weight management (BMI 42) and regular exercise should be initiated. - Patient may benefit from in-lab study  [Electronically signed] 08/28/2017 02:38 PM  Shelva Majestic MD, [facility], ABSM Diplomate, American Board of Sleep Medicine   NPI: [id number] [place] PH: [phone]   FX: [phone] [place]

## 2017-08-29 NOTE — Telephone Encounter (Signed)
Spoke with patient and advised her of her sleep study results and recommendations. Patient voiced understanding of recommendations and requests that I speak to Dr Claiborne Billings about changing his order from a in lab titration study to a APAP for timing purposes. Message routed to Dr Claiborne Billings for review.

## 2017-08-30 ENCOUNTER — Telehealth: Payer: Self-pay | Admitting: *Deleted

## 2017-08-30 NOTE — Telephone Encounter (Signed)
Faxed APAP orders to [facility].

## 2017-11-10 ENCOUNTER — Ambulatory Visit: Payer: BC Managed Care – PPO | Admitting: Cardiovascular Disease

## 2017-11-10 ENCOUNTER — Encounter: Payer: Self-pay | Admitting: Cardiovascular Disease

## 2017-11-10 VITALS — BP 130/90 | HR 84 | Ht 65.0 in | Wt 249.0 lb

## 2017-11-10 DIAGNOSIS — E119 Type 2 diabetes mellitus without complications: Secondary | ICD-10-CM

## 2017-11-10 DIAGNOSIS — I493 Ventricular premature depolarization: Secondary | ICD-10-CM | POA: Diagnosis not present

## 2017-11-10 DIAGNOSIS — G4733 Obstructive sleep apnea (adult) (pediatric): Secondary | ICD-10-CM

## 2017-11-10 DIAGNOSIS — I10 Essential (primary) hypertension: Secondary | ICD-10-CM

## 2017-11-10 NOTE — Patient Instructions (Signed)
Your physician recommends that you schedule a follow-up appointment: AS needed with Dr. Claiborne Billings (sleep clinic)

## 2017-11-10 NOTE — Progress Notes (Signed)
Cardiology Office Note    Date:  11/12/2017   ID:  Krista [name], DOB 09/10/1970, MRN [id number]  PCP:  [name], MD  Cardiologist:  Shelva Majestic, MD (sleep); Dr. Debara Pickett  New sleep evaluation  History of Present Illness:  Krista [name] is a 47 y.o. female who has a history of hypertension, type 2 diabetes mellitus, anxiety, palpitations and prior diagnosis of obstructive sleep apnea.  She had recently undergone a sleep study with reinstitution of CPAP therapy and presents for initial sleep evaluation.  Krista [name] is a patient of Dr. Debara Pickett who was diagnosed with sleep apnea in May 2012.  At that time, she had mild overall sleep apnea with an AHI of 5.9, RDI of 8.1/h, but during rem sleep AHI was 28.1.  She states that she was on CPAP therapy for approximately 1 year but that ultimately had machine malfunction.  She was never seen subsequently.  She was recently evaluated by Jory Sims with complaints of frequent palpitations with PVCs.  During that evaluation, it became apparent that she had not been utilizing CPAP therapy for years.  She was scheduled for a sleep study which was done on August 18, 2017 as a home study which now confirmed mild to moderate overall sleep apnea with an AHI of 14.4.  Her events with worse with supine sleep with an AHI of 22.0.  Since this was a home study there was no way to know the severity of her sleep apnea during REM sleep.  She snored for 53.7% of the night and had significant oxygen desaturation to a nadir of 84%.  She was started on CPAP therapy with [facility] as her DME company with set up date on September 11, 2017.  She has an ResMed air sense 10 AutoSet unit and for her auto titration had been on a minimum pressure of 5 with a maximum pressure up to 20 cm.  A download in the office today for the past month shows 100% compliance.  There is no leak.  Her 95th percentile pressure was 12.1 with a maximum average pressure of 13.5.  Since  reinitiating CPAP therapy, she feels much better.  Her nocturia has significantly improved.  She is has more energy.  Her morning headaches have resolved.  She is unaware of any snoring.  She presents for establishment of sleep care with me.   Past Medical History:  Diagnosis Date  . Abnormal Pap smear of cervix 2006   Leep, colpo  . Anxiety   . Diabetes mellitus without complication ([place])    type 2  . Fibroid   . GERD (gastroesophageal reflux disease)   . History of palpitations    PVCs - wore holter monitor, negative results  . Hypertension   . OSA (obstructive sleep apnea) 06/2010   AHI during total sleep 5.9/hr and REM sleep 28.1/hr (mild OSA) 06/2010, uses CPAP nightly  . Pre-diabetes   . Restless leg syndrome    patient denies this dx  . Seasonal allergies   . STD (sexually transmitted disease)    hx HPV  . SVD (spontaneous vaginal delivery)    x 1    Past Surgical History:  Procedure Laterality Date  . CERVICAL BIOPSY  W/ LOOP ELECTRODE EXCISION  2006  . COLPOSCOPY  2006  . CYSTOSCOPY N/A 07/21/2015   Procedure: CYSTOSCOPY;  Surgeon: [name], MD;  Location: [facility];  Service: Gynecology;  Laterality: N/A;  . ROBOTIC ASSISTED TOTAL HYSTERECTOMY  WITH SALPINGECTOMY Bilateral 07/21/2015   Procedure: ROBOTIC ASSISTED TOTAL HYSTERECTOMY WITH SALPINGECTOMY;  Surgeon: [name], MD;  Location: [facility];  Service: Gynecology;  Laterality: Bilateral;  . UPPER GI ENDOSCOPY    . WISDOM TOOTH EXTRACTION      Current Medications: Outpatient Medications Prior to Visit  Medication Sig Dispense Refill  . DULoxetine (CYMBALTA) 60 MG capsule Take 1 capsule by mouth daily before supper.     . fexofenadine (ALLEGRA) 180 MG tablet Take 180 mg by mouth daily as needed.     Marland Kitchen glimepiride (AMARYL) 2 MG tablet Take 2 mg by mouth daily with breakfast.     . Liraglutide (VICTOZA) 18 MG/3ML SOPN Inject 1.8 mg into the skin daily.     Marland Kitchen losartan-hydrochlorothiazide  (HYZAAR) 100-25 MG tablet Take 1 tablet by mouth daily. Reported on 07/11/2015    . montelukast (SINGULAIR) 10 MG tablet Take 10 mg by mouth at bedtime.     . Multiple Vitamin (MULTIVITAMIN) capsule Take 1 capsule by mouth daily.    . pantoprazole (PROTONIX) 40 MG tablet Take 40 mg by mouth daily.     . propranolol ER (INDERAL LA) 120 MG 24 hr capsule Take 1 capsule by mouth daily.    . SYMBICORT 160-4.5 MCG/ACT inhaler Inhale 1 puff into the lungs as needed.     . Vitamin D, Ergocalciferol, (DRISDOL) 50000 UNITS CAPS capsule Take 50,000 Units by mouth daily.     No facility-administered medications prior to visit.      Allergies:   Ace inhibitors and Penicillins   Social History   Socioeconomic History  . Marital status: Married    Spouse name: Not on file  . Number of children: 1  . Years of education: 85  . Highest education level: Not on file  Occupational History  . Occupation: Environmental consultant - [employer]    Employer: [employer]  . Financial resource strain: Not on file  . Food insecurity:    Worry: Not on file    Inability: Not on file  . Transportation needs:    Medical: Not on file    Non-medical: Not on file  Tobacco Use  . Smoking status: Never Smoker  . Smokeless tobacco: Never Used  Substance and Sexual Activity  . Alcohol use: No    Alcohol/week: 0.0 standard drinks  . Drug use: No  . Sexual activity: Yes    Partners: Male    Birth control/protection: Surgical    Comment: R-TLH/Bil.salingectomy  Lifestyle  . Physical activity:    Days per week: Not on file    Minutes per session: Not on file  . Stress: Not on file  Relationships  . Social connections:    Talks on phone: Not on file    Gets together: Not on file    Attends religious service: Not on file    Active member of club or organization: Not on file    Attends meetings of clubs or organizations: Not on file    Relationship status: Not on file  Other Topics Concern  . Not on  file  Social History Narrative  . Not on file     Family History:  The patient's family history includes Alzheimer's disease in her paternal grandfather; Breast cancer in her cousin and paternal aunt; Cancer in her maternal aunt; Diabetes in her father and paternal grandmother; Hypertension in her father, maternal grandmother, mother, and paternal grandmother; Ovarian cancer in her maternal  grandmother; Stroke in her maternal grandfather.   ROS General: Negative; No fevers, chills, or night sweats;  HEENT: Negative; No changes in vision or hearing, sinus congestion, difficulty swallowing Pulmonary: Negative; No cough, wheezing, shortness of breath, hemoptysis Cardiovascular: Negative; No chest pain, presyncope, syncope, palpitations GI: Negative; No nausea, vomiting, diarrhea, or abdominal pain GU: Negative; No dysuria, hematuria, or difficulty voiding Musculoskeletal: Negative; no myalgias, joint pain, or weakness Hematologic/Oncology: Negative; no easy bruising, bleeding Endocrine: Negative; no heat/cold intolerance; no diabetes Neuro: Negative; no changes in balance, headaches Skin: Negative; No rashes or skin lesions Psychiatric: Negative; No behavioral problems, depression Sleep: Positive for OSA , initially diagnosed 2012.  Recently reconfirmed on a home sleep study.  No residual snoring.  Mild residual daytime sleepiness;  no bruxism, restless legs, hypnogognic hallucinations, no cataplexy  An Epworth Sleepiness Scale score was calculated in the office today and this endorsed at 12 consistent with mild residual daytime sleepiness.  Other comprehensive 14 point system review is negative.   PHYSICAL EXAM:   VS:  BP 130/90   Pulse 84   Ht 5\' 5"  (1.651 m)   Wt 249 lb (112.9 kg)   LMP 07/02/2015 (Approximate)   BMI 41.44 kg/m     Repeat blood pressure by me 122/82 Wt Readings from Last 3 Encounters:  11/10/17 249 lb (112.9 kg)  08/08/17 247 lb (112 kg)  04/05/17 247 lb (112  kg)    General: Alert, oriented, no distress.  Skin: normal turgor, no rashes, warm and dry HEENT: Normocephalic, atraumatic. Pupils equal round and reactive to light; sclera anicteric; extraocular muscles intact; Fundi no hemorrhages or exudates.  Arcus senilis Nose without nasal septal hypertrophy Mouth/Parynx benign; Mallinpatti scale 3 Neck: No JVD, no carotid bruits; normal carotid upstroke Lungs: clear to ausculatation and percussion; no wheezing or rales Chest wall: without tenderness to palpitation Heart: PMI not displaced, RRR, s1 s2 normal, 1/6 systolic murmur, no diastolic murmur, no rubs, gallops, thrills, or heaves Abdomen: Central adiposity;soft, nontender; no hepatosplenomehaly, BS+; abdominal aorta nontender and not dilated by palpation. Back: no CVA tenderness Pulses 2+ Musculoskeletal: full range of motion, normal strength, no joint deformities Extremities: no clubbing cyanosis or edema, Homan's sign negative  Neurologic: grossly nonfocal; Cranial nerves grossly wnl Psychologic: Normal mood and affect   Studies/Labs Reviewed:   EKG:  EKG is ordered today.  ECG (independently read by me): Normal sinus rhythm at 84 bpm.  Poor anterior R wave progression.  Inferolateral ST changes.  Recent Labs: BMP Latest Ref Rng & Units 09/03/2015 07/22/2015 07/21/2015  Glucose 65 - 99 mg/dL 153(H) 103(H) -  BUN 7 - 25 mg/dL 9 9 -  Creatinine 0.50 - 1.10 mg/dL 0.88 0.83 -  Sodium 135 - 146 mmol/L 141 138 -  Potassium 3.5 - 5.3 mmol/L 3.6 3.6 3.2(L)  Chloride 98 - 110 mmol/L 102 103 -  CO2 20 - 31 mmol/L 28 30 -  Calcium 8.6 - 10.2 mg/dL 9.3 8.3(L) -     No flowsheet data found.  CBC Latest Ref Rng & Units 09/03/2015 07/27/2015 07/22/2015  WBC 3.8 - 10.8 K/uL 7.7 - 9.6  Hemoglobin 11.7 - 15.5 g/dL 12.0 12.4 10.3(L)  Hematocrit 35.0 - 45.0 % 37.5 - 31.7(L)  Platelets 140 - 400 K/uL 332 - 258   Lab Results  Component Value Date   MCV 81.9 09/03/2015   MCV 81.9 07/22/2015   MCV  81.7 07/17/2015   No results found for: TSH No results found for: HGBA1C  BNP No results found for: BNP  ProBNP No results found for: PROBNP   Lipid Panel  No results found for: CHOL, TRIG, HDL, CHOLHDL, VLDL, LDLCALC, LDLDIRECT   RADIOLOGY: No results found.   Additional studies/ records that were reviewed today include:  I reviewed the patient's prior initial sleep studies from the [facility] from 2012.  I reviewed her home study and obtained a new download in the office today since initiation of CPAP therapy.    ASSESSMENT:    1. OSA (obstructive sleep apnea)   2. Frequent PVCs   3. Essential hypertension   4. Type 2 diabetes mellitus without complication, without long-term current use of insulin ([place])   5. Morbid obesity [facility])      PLAN:  Krista [name] is a 46 year old female who has a history of morbid obesity, palpitations, hypertension, and diabetes mellitus.  She also has a history of anxiety.  She was diagnosed with mild overall sleep apnea but moderately severe sleep apnea during rem sleep on her in lab evaluation in 2012.  On her most recent home study, her overall sleep apnea is now approaching moderate with an AHI of 14.4.  I suspect the severity of her rem sleep is now severe.  She has significant positional component with an AHI of 22 with supine posture.  Since initiating CPAP therapy she has felt significantly improved.  I again reviewed with her the data regarding sleep architecture and the effects of obstructive sleep apnea on its normal development.  We discussed the cardiovascular implications associated with untreated sleep apnea.  Presently she is doing well.  She is compliant and meeting standards.  Since she had undergone an auto Pap titration, I will now change her settings to a minimum pressure of 10 and a maximum pressure of 18.  Her most recent AHI was 1.3.  She is using a nasal pillow mask and tolerating this well.  I  answered all her questions.  She feels significantly improved since reinstituting CPAP therapy.  We discussed the importance of weight loss and exercise.  I will be available in the future to see her on an as-needed basis if sleep issues develop.   Medication Adjustments/Labs and Tests Ordered: Current medicines are reviewed at length with the patient today.  Concerns regarding medicines are outlined above.  Medication changes, Labs and Tests ordered today are listed in the Patient Instructions below. Patient Instructions  Your physician recommends that you schedule a follow-up appointment: AS needed with Dr. Claiborne Billings (sleep clinic)     Signed, Shelva Majestic, MD  11/12/2017 4:53 PM    [facility] [address], [place], [place], [place]  [address] Phone: [phone]

## 2017-11-12 ENCOUNTER — Encounter: Payer: Self-pay | Admitting: Cardiovascular Disease

## 2018-04-13 ENCOUNTER — Ambulatory Visit: Payer: BC Managed Care – PPO | Admitting: Obstetrics and Gynecology

## 2018-04-25 ENCOUNTER — Ambulatory Visit: Payer: BC Managed Care – PPO | Admitting: Obstetrics and Gynecology

## 2018-06-15 ENCOUNTER — Other Ambulatory Visit: Payer: Self-pay

## 2018-06-18 ENCOUNTER — Encounter: Payer: Self-pay | Admitting: Obstetrics and Gynecology

## 2018-06-18 ENCOUNTER — Ambulatory Visit: Payer: BC Managed Care – PPO | Admitting: Obstetrics and Gynecology

## 2018-06-18 ENCOUNTER — Other Ambulatory Visit: Payer: Self-pay

## 2018-06-18 VITALS — BP 124/80 | HR 76 | Temp 97.3°F | Resp 14 | Ht 65.25 in | Wt 235.0 lb

## 2018-06-18 DIAGNOSIS — N760 Acute vaginitis: Secondary | ICD-10-CM | POA: Diagnosis not present

## 2018-06-18 DIAGNOSIS — Z01419 Encounter for gynecological examination (general) (routine) without abnormal findings: Secondary | ICD-10-CM | POA: Diagnosis not present

## 2018-06-18 DIAGNOSIS — Z1211 Encounter for screening for malignant neoplasm of colon: Secondary | ICD-10-CM

## 2018-06-18 DIAGNOSIS — N951 Menopausal and female climacteric states: Secondary | ICD-10-CM

## 2018-06-18 NOTE — Progress Notes (Addendum)
48 y.o. G30P1001 Married [place] American female here for annual exam.    Some hot flashes.  Some night sweats.  Not irritable.  States her symptoms are manageable. Wants her hormones checked.   She has periodic vaginal itching.   Also has a hair bump or mole to check.   States she is healthy.  Not sure what her A1C is.  Lost 10 pounds.  Walking.  Planning to do a trail walk today.   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   PCP:  [name], MD   Patient's last menstrual period was 07/02/2015 (approximate).           Sexually active: Yes.    The current method of family planning is status post hysterectomy.  Ovaries remain.  Exercising: Yes.    walking Smoker:  no  Health Maintenance: Pap:  01-09-15 ASCUS HPV HR neg.  Final pathology on cervix at time of hysterectomy was benign. History of abnormal Pap:  LEEP 2006.  LGSIL per patient.  FU paps normal.  MMG:  08-18-17 category c density birads 1:neg Colonoscopy:  n/a BMD:   n/a  Result  n/a TDaP:  2013 Gardasil:   no HIV: donated blood Hep C: donated blood Screening Labs:  PCP   reports that she has never smoked. She has never used smokeless tobacco. She reports that she does not drink alcohol or use drugs.  Past Medical History:  Diagnosis Date  . Abnormal Pap smear of cervix 2006   Leep, colpo  . Anxiety   . Diabetes mellitus without complication ([place])    type 2  . Fibroid   . GERD (gastroesophageal reflux disease)   . History of palpitations    PVCs - wore holter monitor, negative results  . Hypertension   . OSA (obstructive sleep apnea) 06/2010   AHI during total sleep 5.9/hr and REM sleep 28.1/hr (mild OSA) 06/2010, uses CPAP nightly  . Pre-diabetes   . Restless leg syndrome    patient denies this dx  . Seasonal allergies   . STD (sexually transmitted disease)    hx HPV  . SVD (spontaneous vaginal delivery)    x 1    Past Surgical History:  Procedure Laterality Date  . CERVICAL BIOPSY  W/  LOOP ELECTRODE EXCISION  2006  . COLPOSCOPY  2006  . CYSTOSCOPY N/A 07/21/2015   Procedure: CYSTOSCOPY;  Surgeon: [name], MD;  Location: [facility];  Service: Gynecology;  Laterality: N/A;  . ROBOTIC ASSISTED TOTAL HYSTERECTOMY WITH SALPINGECTOMY Bilateral 07/21/2015   Procedure: ROBOTIC ASSISTED TOTAL HYSTERECTOMY WITH SALPINGECTOMY;  Surgeon: [name], MD;  Location: [facility];  Service: Gynecology;  Laterality: Bilateral;  . UPPER GI ENDOSCOPY    . WISDOM TOOTH EXTRACTION      Current Outpatient Medications  Medication Sig Dispense Refill  . baclofen (LIORESAL) 10 MG tablet Take 10 mg by mouth as needed for muscle spasms.    . Dulaglutide (TRULICITY [place]) Inject into the skin.    . DULoxetine (CYMBALTA) 60 MG capsule Take 1 capsule by mouth daily before supper.     . fexofenadine (ALLEGRA) 180 MG tablet Take 180 mg by mouth daily as needed.     Marland Kitchen glimepiride (AMARYL) 2 MG tablet Take 2 mg by mouth daily with breakfast.     . losartan-hydrochlorothiazide (HYZAAR) 100-25 MG tablet Take 1 tablet by mouth daily. Reported on 07/11/2015    . montelukast (SINGULAIR) 10 MG tablet  Take 10 mg by mouth at bedtime.     . Multiple Vitamin (MULTIVITAMIN) capsule Take 1 capsule by mouth daily.    . pantoprazole (PROTONIX) 40 MG tablet Take 40 mg by mouth daily.     . propranolol ER (INDERAL LA) 120 MG 24 hr capsule Take 1 capsule by mouth daily.    . rosuvastatin (CRESTOR) 5 MG tablet Take 5 mg by mouth daily.    . SYMBICORT 160-4.5 MCG/ACT inhaler Inhale 1 puff into the lungs as needed.     . Vitamin D, Ergocalciferol, (DRISDOL) 50000 UNITS CAPS capsule Take 50,000 Units by mouth daily.     No current facility-administered medications for this visit.     Family History  Problem Relation Age of Onset  . Hypertension Mother   . Diabetes Father   . Hypertension Father   . Ovarian cancer Maternal Grandmother   . Hypertension Maternal Grandmother   . Stroke Maternal  Grandfather   . Diabetes Paternal Grandmother   . Hypertension Paternal Grandmother   . Alzheimer's disease Paternal Grandfather   . Cancer Maternal Aunt        dec bladder ca  . Breast cancer Paternal Aunt   . Breast cancer Cousin     Review of Systems  Constitutional: Negative.   HENT: Negative.   Eyes: Negative.   Respiratory: Negative.   Cardiovascular: Negative.   Gastrointestinal: Negative.   Endocrine: Negative.   Genitourinary: Negative.   Musculoskeletal: Negative.   Skin: Negative.   Allergic/Immunologic: Negative.   Neurological: Negative.   Hematological: Negative.   Psychiatric/Behavioral: Negative.     Exam:   BP 124/80 (BP Location: Right Arm, Patient Position: Sitting, Cuff Size: Large)   Pulse 76   Temp (!) 97.3 F (36.3 C) (Temporal)   Resp 14   Ht 5' 5.25" (1.657 m)   Wt 235 lb (106.6 kg)   LMP 07/02/2015 (Approximate)   BMI 38.81 kg/m     General appearance: alert, cooperative and appears stated age Head: Normocephalic, without obvious abnormality, atraumatic Neck: no adenopathy, supple, symmetrical, trachea midline and thyroid normal to inspection and palpation Lungs: clear to auscultation bilaterally Breasts: normal appearance, no masses or tenderness, No nipple retraction or dimpling, No nipple discharge or bleeding, No axillary or supraclavicular adenopathy Heart: regular rate and rhythm Abdomen: soft, non-tender; no masses, no organomegaly Extremities: extremities normal, atraumatic, no cyanosis or edema Skin: Skin color, texture, turgor normal. No rashes or lesions Lymph nodes: Cervical, supraclavicular, and axillary nodes normal. No abnormal inguinal nodes palpated Neurologic: Grossly normal  Pelvic: External genitalia:  Left labia majora with 8 mm subcutaneous cyst consistent with sebaceous cyst.               Urethra:  normal appearing urethra with no masses, tenderness or lesions              Bartholins and Skenes: normal                  Vagina: normal appearing vagina with normal color and discharge, no lesions              Cervix: absent              Pap taken: No. Bimanual Exam:  Uterus:  Absent.              Adnexa: no mass, fullness, tenderness              Rectal exam: Yes.  .  Confirms.  Anus:  normal sphincter tone, no lesions  Chaperone was present for exam.  Assessment:   Well woman visit with normal exam. Status post robotic TLH/bilateral salpingectomy.  Ovaries remain.  Hx LEEP for LGSIL per patient.   Left vulvar sebaceous cyst.  FH ovarian cancer.  MGM. FH breast cancer.  Paternal aunt and cousin. DM.  HTN.  Anxiety.  On Cymbalta.  Plan: Mammogram screening. Recommended self breast awareness. Pap and HR HPV as above. Guidelines for Calcium, Vitamin D, regular exercise program including cardiovascular and weight bearing exercise. FSH and estradiol.  Affirm.  Will do Diflucan if has yeast infection.  We discussed good blood sugar control for reduction of yeast infections.  Follow up annually and prn.   After visit summary provided.

## 2018-06-18 NOTE — Patient Instructions (Signed)

## 2018-06-19 LAB — FOLLICLE STIMULATING HORMONE: FSH: 23.6 m[IU]/mL

## 2018-06-19 LAB — VAGINITIS/VAGINOSIS, DNA PROBE
Candida Species: NEGATIVE
Gardnerella vaginalis: NEGATIVE
Trichomonas vaginosis: NEGATIVE

## 2018-06-19 LAB — ESTRADIOL: Estradiol: 50.6 pg/mL

## 2018-07-04 ENCOUNTER — Telehealth: Payer: Self-pay | Admitting: Obstetrics and Gynecology

## 2018-07-04 NOTE — Telephone Encounter (Signed)
Left message to call Rossana Molchan, RN at [facility] [phone].   

## 2018-07-04 NOTE — Telephone Encounter (Signed)
Patient believes she has a yeast infection. States she was in 06/18/18 for symptoms, but it was negative. Patient states she is very uncomfortable and "just needs some relief". Patient has tried 3 days of monistat. Aware she may need to be seen.

## 2018-07-09 NOTE — Telephone Encounter (Signed)
Left message to call Yoltzin Ransom, RN at [facility] [phone].   

## 2018-07-13 ENCOUNTER — Other Ambulatory Visit: Payer: Self-pay | Admitting: Internal Medicine

## 2018-07-13 DIAGNOSIS — Z1231 Encounter for screening mammogram for malignant neoplasm of breast: Secondary | ICD-10-CM

## 2018-07-13 NOTE — Telephone Encounter (Signed)
No return call. MyChart message to patient.   Routing to Dr. Antony Blackbird.   Encounter closed.

## 2018-08-30 ENCOUNTER — Other Ambulatory Visit: Payer: Self-pay

## 2018-08-30 ENCOUNTER — Ambulatory Visit
Admission: RE | Admit: 2018-08-30 | Discharge: 2018-08-30 | Disposition: A | Discharge status: Home / Self Care | Payer: BC Managed Care – PPO | Source: Ambulatory Visit | Attending: Internal Medicine | Admitting: Internal Medicine

## 2018-08-30 DIAGNOSIS — Z1231 Encounter for screening mammogram for malignant neoplasm of breast: Secondary | ICD-10-CM

## 2019-04-30 ENCOUNTER — Ambulatory Visit: Payer: BC Managed Care – PPO | Admitting: Podiatry

## 2019-04-30 ENCOUNTER — Other Ambulatory Visit: Payer: Self-pay

## 2019-04-30 DIAGNOSIS — L603 Nail dystrophy: Secondary | ICD-10-CM | POA: Diagnosis not present

## 2019-04-30 DIAGNOSIS — M79676 Pain in unspecified toe(s): Secondary | ICD-10-CM

## 2019-04-30 NOTE — Progress Notes (Signed)
  Subjective:  Patient ID: Krista [name], female    DOB: October 30, 1970,  MRN: [id number]  No chief complaint on file.   49 y.o. female presents with the above complaint. History confirmed with patient. Reports 2nd toenails are thick and discolored with soreness with burning. Has tried OTC anti-fungal medication.   Objective:  Physical Exam: warm, good capillary refill, no trophic changes or ulcerative lesions, normal DP and PT pulses and normal sensory exam. Bilat 2nd toenail thickening, dystrophy, black discoloration. Assessment:   1. Nail dystrophy   2. Pain around toenail      Plan:  Patient was evaluated and treated and all questions answered.  Nail Dystrophy -Discussed findings likely 2/2 trauma -Nails debrided, courtesy -Recommend urea gel to soften nails -Discussed removal should issues persis -F/u PRN  No follow-ups on file.

## 2019-07-04 ENCOUNTER — Encounter: Payer: Self-pay | Admitting: Sports Medicine

## 2019-07-04 ENCOUNTER — Other Ambulatory Visit: Payer: Self-pay | Admitting: Sports Medicine

## 2019-07-04 ENCOUNTER — Other Ambulatory Visit: Payer: Self-pay

## 2019-07-04 ENCOUNTER — Ambulatory Visit: Payer: BC Managed Care – PPO | Admitting: Sports Medicine

## 2019-07-04 DIAGNOSIS — M779 Enthesopathy, unspecified: Secondary | ICD-10-CM

## 2019-07-04 DIAGNOSIS — L603 Nail dystrophy: Secondary | ICD-10-CM

## 2019-07-04 DIAGNOSIS — M792 Neuralgia and neuritis, unspecified: Secondary | ICD-10-CM

## 2019-07-04 DIAGNOSIS — M79676 Pain in unspecified toe(s): Secondary | ICD-10-CM

## 2019-07-04 DIAGNOSIS — M25473 Effusion, unspecified ankle: Secondary | ICD-10-CM

## 2019-07-04 DIAGNOSIS — M79672 Pain in left foot: Secondary | ICD-10-CM

## 2019-07-04 DIAGNOSIS — Q742 Other congenital malformations of lower limb(s), including pelvic girdle: Secondary | ICD-10-CM

## 2019-07-04 MED ORDER — TRIAMCINOLONE ACETONIDE 10 MG/ML IJ SUSP
10.0000 mg | Freq: Once | INTRAMUSCULAR | Status: DC
Start: 2019-07-04 — End: 2020-09-25

## 2019-07-04 NOTE — Progress Notes (Signed)
Subjective: Yanara Schenk is a 49 y.o. female patient who presents to office for evaluation of pain at the toenail left second toe greater than right second toe with thickness reports that she is using topical medication as dispensed last visit for at least 1 month with no improvement reports that she is noticing also more numbness to the toe occasionally when she is in shoes as well as more significant pain at the toe joint of the left second toe reports that pain is a constant burning pain over the toe joint that feels deep pain is 6 out of 10 when it is worse reports that she is diabetic last blood sugar 103 and does not remember last A1c denies injury/trip/fall/sprain/any causative factors.   Patient Active Problem List   Diagnosis Date Noted  . 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 Morbid obesity ([place]) 11/22/2012  . OSA on CPAP 11/22/2012  . HTN (hypertension) 11/22/2012  . DOE (dyspnea on exertion) 11/22/2012  . Abnormal EKG 11/22/2012    Current Outpatient Medications on File Prior to Visit  Medication Sig Dispense Refill  . amLODipine (NORVASC) 10 MG tablet Take 10 mg by mouth daily.    . baclofen (LIORESAL) 10 MG tablet Take 10 mg by mouth as needed for muscle spasms.    . Dulaglutide (TRULICITY [place]) Inject into the skin.    . DULoxetine (CYMBALTA) 60 MG capsule Take 1 capsule by mouth daily before supper.     . fexofenadine (ALLEGRA) 180 MG tablet Take 180 mg by mouth daily as needed.     . fluconazole (DIFLUCAN) 150 MG tablet Take 150 mg by mouth once a week.    Marland Kitchen glimepiride (AMARYL) 2 MG tablet Take 2 mg by mouth daily with breakfast.     . losartan-hydrochlorothiazide (HYZAAR) 100-25 MG tablet Take 1 tablet by mouth daily. Reported on 07/11/2015    . metFORMIN (GLUCOPHAGE) 500 MG tablet Take 500 mg by mouth 2 (two) times daily.    . montelukast (SINGULAIR) 10 MG tablet Take 10 mg by mouth at bedtime.     . Multiple Vitamin  (MULTIVITAMIN) capsule Take 1 capsule by mouth daily.    . pantoprazole (PROTONIX) 40 MG tablet Take 40 mg by mouth daily.     . propranolol ER (INDERAL LA) 120 MG 24 hr capsule Take 1 capsule by mouth daily.    . rosuvastatin (CRESTOR) 5 MG tablet Take 5 mg by mouth daily.    . SYMBICORT 160-4.5 MCG/ACT inhaler Inhale 1 puff into the lungs as needed.     . Vitamin D, Ergocalciferol, (DRISDOL) 50000 UNITS CAPS capsule Take 50,000 Units by mouth daily.     No current facility-administered medications on file prior to visit.    Allergies  Allergen Reactions  . Ace Inhibitors Cough  . Penicillins Itching, Swelling and Rash    Has patient had a PCN reaction causing immediate rash, facial/tongue/throat swelling, SOB or lightheadedness with hypotension: Yes Has patient had a PCN reaction causing severe rash involving mucus membranes or skin necrosis: Yes Has patient had a PCN reaction that required hospitalization No Has patient had a PCN reaction occurring within the last 10 years: No If all of the above answers are "NO", then may proceed with Cephalosporin use.     Objective:  General: Alert and oriented x3 in no acute distress  Dermatology: No open lesions bilateral lower extremities, no webspace macerations, no ecchymosis bilateral, all nails x 10 are  well manicured but there is severe thickening noted at the second toenails bilateral with mild subungual debris.  Vascular: Dorsalis Pedis and Posterior Tibial pedal pulses palpable, Capillary Fill Time 3 seconds,(+) pedal hair growth bilateral, trace edema bilateral lower extremities, Temperature gradient within normal limits.  Neurology: Johney Maine sensation intact via light touch bilateral.  Musculoskeletal: Mild tenderness with palpation at left second metatarsophalangeal joint.  Long second toe bilateral left greater than right. Pes planus foot type.  Assessment and Plan: Problem List Items Addressed This Visit    None    Visit  Diagnoses    Capsulitis    -  Primary   Relevant Medications   triamcinolone acetonide (KENALOG) 10 MG/ML injection 10 mg (Start on 07/04/2019  7:00 PM)   Left foot pain       Neuritis       Long toe       Nail dystrophy       Pain around toenail       Ankle swelling, unspecified laterality           -Complete examination performed -Xrays were held this visit however at next visit if pain continues and has not been improved with injection on the left foot we will get an x-ray for further evaluation -Discussed treatement options for likely capsulitis secondary to long toe on the left foot as well as mechanical nail thickening versus onychomycosis -After oral consent and aseptic prep, injected a mixture containing 1 ml of 2%  plain lidocaine, 1 ml 0.5% plain marcaine, 0.5 ml of kenalog 10 and 0.5 ml of dexamethasone phosphate into left second metatarsophalangeal joint without complication. Post-injection care discussed with patient.  -Advised good supportive shoes daily for foot type -Dispensed to patient Surgitube compression sleeves to wear to assist with edema control and to continue with PCP management of edema with taking Lasix -Advised patient that swelling can be increased when she is doing a lot of standing or a lot of sitting so it is important to wear her stockings or sleeves as I gave this visit and to take her medication to help with edema -Advised patient to continue with toclyn urea gel to nails to help with the thickening and changes to the nails which is likely also secondary to her long second toe since both second toenails are thicker compared to her other toenails advised patient to use a nail file before applying the medication to help penetrate better at no additional choices visit mechanically debrided nails and smoothed with a rasp without iatrogenic bleeding -Patient to return to office in 1 month for follow-up on capsulitis and toe check or sooner if condition  worsens.  Landis Martins, DPM

## 2019-07-05 NOTE — Progress Notes (Signed)
49 y.o. G17P1001 Married [place] American female here for annual exam.    Low back discomfort from sitting a lot.  Will start water aerobics.   She takes Ditropan XL for urinary frequency.  She has a sore back when her bladder is full, so it does not take it every day.  She has some leakage with sneezing.   Has some heat during the night.  Tolerating it well.  [place] 23.6 on 06/18/18.  Patient complaining of "bump" in left vaginal area for a while per patient.  It is getting bigger.  No drainage.  No pain.  No vaginal discharge.  Had her Covid vaccine.   PCP:  [name], MD   Patient's last menstrual period was 07/02/2015 (approximate).           Sexually active: Yes.    The current method of family planning is status post hysterectomy--ovaries remain.    Exercising: Yes.    tries to walk when she can Smoker:  no  Health Maintenance: Pap:01-09-15 ASCUS HPV HR neg, 12-12-13 Neg:Neg Hr HPV, 09-13-12 Neg:Neg HR HPV,  Final pathology on cervix at time of hysterectomy was benign.  History of abnormal Pap:  Yes. LEEP 2006.  LGSIL per patient.  FU paps normal.   MMG: 08-30-18 3D/Neg/density B/BiRads1 Colonoscopy:  NEVER BMD:   N/A  Result  N/A TDaP:  2013 Gardasil:   no GYF:VCBSWHQ blood Hep C:Donated blood Screening Labs:  PCP.    reports that she has never smoked. She has never used smokeless tobacco. She reports that she does not drink alcohol or use drugs.  Past Medical History:  Diagnosis Date  . Abnormal Pap smear of cervix 2006   Leep, colpo  . Anxiety   . Diabetes mellitus without complication ([place])    type 2  . Fibroid   . GERD (gastroesophageal reflux disease)   . History of palpitations    PVCs - wore holter monitor, negative results  . Hypertension   . OSA (obstructive sleep apnea) 06/2010   AHI during total sleep 5.9/hr and REM sleep 28.1/hr (mild OSA) 06/2010, uses CPAP nightly  . Pre-diabetes   . Restless leg syndrome    patient denies this dx  . Seasonal  allergies   . STD (sexually transmitted disease)    hx HPV  . SVD (spontaneous vaginal delivery)    x 1    Past Surgical History:  Procedure Laterality Date  . CERVICAL BIOPSY  W/ LOOP ELECTRODE EXCISION  2006  . COLPOSCOPY  2006  . CYSTOSCOPY N/A 07/21/2015   Procedure: CYSTOSCOPY;  Surgeon: [name], MD;  Location: [facility];  Service: Gynecology;  Laterality: N/A;  . ROBOTIC ASSISTED TOTAL HYSTERECTOMY WITH SALPINGECTOMY Bilateral 07/21/2015   Procedure: ROBOTIC ASSISTED TOTAL HYSTERECTOMY WITH SALPINGECTOMY;  Surgeon: [name], MD;  Location: [facility];  Service: Gynecology;  Laterality: Bilateral;  . UPPER GI ENDOSCOPY    . WISDOM TOOTH EXTRACTION      Current Outpatient Medications  Medication Sig Dispense Refill  . amLODipine (NORVASC) 10 MG tablet Take 10 mg by mouth daily.    . baclofen (LIORESAL) 10 MG tablet Take 10 mg by mouth as needed for muscle spasms.    . Dulaglutide (TRULICITY [place]) Inject into the skin.    . DULoxetine (CYMBALTA) 60 MG capsule Take 1 capsule by mouth daily before supper.     . fexofenadine (ALLEGRA) 180 MG tablet Take 180 mg by mouth daily as  needed.     . fluconazole (DIFLUCAN) 150 MG tablet Take 150 mg by mouth once a week.    . fluticasone (FLONASE) 50 MCG/ACT nasal spray SMARTSIG:2 Spray(s) Both Nares Every Morning    . furosemide (LASIX) 20 MG tablet Take 20 mg by mouth 2 (two) times daily as needed.    Marland Kitchen losartan-hydrochlorothiazide (HYZAAR) 100-25 MG tablet Take 1 tablet by mouth daily. Reported on 07/11/2015    . metFORMIN (GLUCOPHAGE) 500 MG tablet Take 500 mg by mouth 2 (two) times daily.    . montelukast (SINGULAIR) 10 MG tablet Take 10 mg by mouth at bedtime.     . Multiple Vitamin (MULTIVITAMIN) capsule Take 1 capsule by mouth daily.    [name] VERIO test strip 1 each 2 (two) times daily.    Marland Kitchen oxybutynin (DITROPAN-XL) 10 MG 24 hr tablet Take 10 mg by mouth daily.    . pantoprazole (PROTONIX) 40 MG tablet Take  40 mg by mouth daily.     . potassium chloride (KLOR-CON) 10 MEQ tablet Take 10 mEq by mouth daily.    . propranolol ER (INDERAL LA) 160 MG SR capsule Take 160 mg by mouth daily.    . rosuvastatin (CRESTOR) 5 MG tablet Take 5 mg by mouth daily.    . SYMBICORT 160-4.5 MCG/ACT inhaler Inhale 1 puff into the lungs as needed.     . Vitamin D, Ergocalciferol, (DRISDOL) 50000 UNITS CAPS capsule Take 50,000 Units by mouth daily.     Current Facility-Administered Medications  Medication Dose Route Frequency Provider Last Rate Last Admin  . triamcinolone acetonide (KENALOG) 10 MG/ML injection 10 mg  10 mg Other Once Landis Martins, DPM        Family History  Problem Relation Age of Onset  . Hypertension Mother   . Diabetes Father   . Hypertension Father   . Ovarian cancer Maternal Grandmother   . Hypertension Maternal Grandmother   . Stroke Maternal Grandfather   . Diabetes Paternal Grandmother   . Hypertension Paternal Grandmother   . Alzheimer's disease Paternal Grandfather   . Cancer Maternal Aunt        dec bladder ca  . Breast cancer Paternal Aunt   . Breast cancer Cousin     Review of Systems  All other systems reviewed and are negative.   Exam:   BP 116/64 (Cuff Size: Large)   Pulse 80   Temp 97.6 F (36.4 C) (Temporal)   Resp 16   Ht 5' 4.5" (1.638 m)   Wt 237 lb 3.2 oz (107.6 kg)   LMP 07/02/2015 (Approximate)   BMI 40.09 kg/m     General appearance: alert, cooperative and appears stated age Head: normocephalic, without obvious abnormality, atraumatic Neck: no adenopathy, supple, symmetrical, trachea midline and thyroid normal to inspection and palpation Lungs: clear to auscultation bilaterally Breasts: normal appearance, no masses or tenderness, No nipple retraction or dimpling, No nipple discharge or bleeding, No axillary adenopathy Heart: regular rate and rhythm Abdomen: soft, non-tender; no masses, no organomegaly Extremities: extremities normal, atraumatic, no  cyanosis or edema Skin: skin color, texture, turgor normal. No rashes or lesions Lymph nodes: cervical, supraclavicular, and axillary nodes normal. Neurologic: grossly normal  Pelvic: External genitalia:   7 mm left inferior labial sebaceous cyst.              No abnormal inguinal nodes palpated.              Urethra:  normal appearing urethra with  no masses, tenderness or lesions              Bartholins and Skenes: normal                 Vagina: normal appearing vagina with normal color and discharge, no lesions              Cervix: absent              Pap taken: No. Bimanual Exam:  Uterus:  absent              Adnexa: no mass, fullness, tenderness              Rectal exam: Yes.  .  Confirms.              Anus:  normal sphincter tone, small hemorrhoid at 7:00?  Chaperone was present for exam.  Assessment:   Well woman visit with normal exam. Status post robotic TLH/bilateral salpingectomy. Ovaries remain.  Hx LEEP for LGSIL per patient.  Final pathology on cervix at hysterectomy - benign.  Left vulvar sebaceous cyst.  FH ovarian cancer.MGM. FH breast cancer. Paternal aunt and cousin. DM.  HTN.  Anxiety. On Cymbalta.  Plan: Mammogram screening discussed. Self breast awareness reviewed. Pap and HR HPV as above. Guidelines for Calcium, Vitamin D, regular exercise program including cardiovascular and weight bearing exercise. She will do her colonoscopy in  with her family's GI. Follow up annually and prn.   After visit summary provided.

## 2019-07-08 ENCOUNTER — Other Ambulatory Visit: Payer: Self-pay

## 2019-07-08 ENCOUNTER — Ambulatory Visit: Payer: BC Managed Care – PPO | Admitting: Obstetrics and Gynecology

## 2019-07-08 ENCOUNTER — Encounter: Payer: Self-pay | Admitting: Obstetrics and Gynecology

## 2019-07-08 VITALS — BP 116/64 | HR 80 | Temp 97.6°F | Resp 16 | Ht 64.5 in | Wt 237.2 lb

## 2019-07-08 DIAGNOSIS — Z01419 Encounter for gynecological examination (general) (routine) without abnormal findings: Secondary | ICD-10-CM | POA: Diagnosis not present

## 2019-07-08 NOTE — Patient Instructions (Signed)

## 2019-08-02 ENCOUNTER — Encounter: Payer: Self-pay | Admitting: Sports Medicine

## 2019-08-02 ENCOUNTER — Other Ambulatory Visit: Payer: Self-pay

## 2019-08-02 ENCOUNTER — Ambulatory Visit: Payer: BC Managed Care – PPO | Admitting: Sports Medicine

## 2019-08-02 DIAGNOSIS — Q742 Other congenital malformations of lower limb(s), including pelvic girdle: Secondary | ICD-10-CM

## 2019-08-02 DIAGNOSIS — M25473 Effusion, unspecified ankle: Secondary | ICD-10-CM

## 2019-08-02 DIAGNOSIS — M779 Enthesopathy, unspecified: Secondary | ICD-10-CM | POA: Diagnosis not present

## 2019-08-02 DIAGNOSIS — L603 Nail dystrophy: Secondary | ICD-10-CM

## 2019-08-02 DIAGNOSIS — M79672 Pain in left foot: Secondary | ICD-10-CM | POA: Diagnosis not present

## 2019-08-02 DIAGNOSIS — M7741 Metatarsalgia, right foot: Secondary | ICD-10-CM

## 2019-08-02 DIAGNOSIS — M792 Neuralgia and neuritis, unspecified: Secondary | ICD-10-CM | POA: Diagnosis not present

## 2019-08-02 DIAGNOSIS — M2142 Flat foot [pes planus] (acquired), left foot: Secondary | ICD-10-CM

## 2019-08-02 DIAGNOSIS — M79676 Pain in unspecified toe(s): Secondary | ICD-10-CM

## 2019-08-02 DIAGNOSIS — M2141 Flat foot [pes planus] (acquired), right foot: Secondary | ICD-10-CM

## 2019-08-02 DIAGNOSIS — M7742 Metatarsalgia, left foot: Secondary | ICD-10-CM

## 2019-08-02 DIAGNOSIS — E119 Type 2 diabetes mellitus without complications: Secondary | ICD-10-CM

## 2019-08-02 NOTE — Progress Notes (Signed)
Subjective: Krista [name] is a 49 y.o. female patient who returns office for follow-up evaluation of foot pain reports that her left toe joint is doing very small burning sensation every once in a while pain at most 4 out of 10 denies any redness warmth swelling at the previous injection site at the left second toe joint.  Patient reports that she has been compliant with using her toecyln gel to the nails with no major difference noted.  Patient also admits that the swelling in her ankles are much better after using compression sleeves and also reports that she is now only working her part-time job and is able to rest her feet more since she is out of school for the summer.  Patient denies any changes in medication or any other pedal complaints at this time.  Fasting blood sugar not recorded.   Patient Active Problem List   Diagnosis Date Noted  . 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 Morbid obesity ([place]) 11/22/2012  . OSA on CPAP 11/22/2012  . HTN (hypertension) 11/22/2012  . DOE (dyspnea on exertion) 11/22/2012  . Abnormal EKG 11/22/2012    Current Outpatient Medications on File Prior to Visit  Medication Sig Dispense Refill  . amLODipine (NORVASC) 10 MG tablet Take 10 mg by mouth daily.    . baclofen (LIORESAL) 10 MG tablet Take 10 mg by mouth as needed for muscle spasms.    . Dulaglutide (TRULICITY [place]) Inject into the skin.    . DULoxetine (CYMBALTA) 60 MG capsule Take 1 capsule by mouth daily before supper.     . fexofenadine (ALLEGRA) 180 MG tablet Take 180 mg by mouth daily as needed.     . fluconazole (DIFLUCAN) 150 MG tablet Take 150 mg by mouth once a week.    . fluticasone (FLONASE) 50 MCG/ACT nasal spray SMARTSIG:2 Spray(s) Both Nares Every Morning    . furosemide (LASIX) 20 MG tablet Take 20 mg by mouth 2 (two) times daily as needed.    Marland Kitchen losartan-hydrochlorothiazide (HYZAAR) 100-25 MG tablet Take 1 tablet by mouth daily.  Reported on 07/11/2015    . metFORMIN (GLUCOPHAGE) 500 MG tablet Take 500 mg by mouth 2 (two) times daily.    . montelukast (SINGULAIR) 10 MG tablet Take 10 mg by mouth at bedtime.     . Multiple Vitamin (MULTIVITAMIN) capsule Take 1 capsule by mouth daily.    [name] VERIO test strip 1 each 2 (two) times daily.    Marland Kitchen oxybutynin (DITROPAN-XL) 10 MG 24 hr tablet Take 10 mg by mouth daily.    . pantoprazole (PROTONIX) 40 MG tablet Take 40 mg by mouth daily.     . potassium chloride (KLOR-CON) 10 MEQ tablet Take 10 mEq by mouth daily.    . propranolol ER (INDERAL [place]) 160 MG SR capsule Take 160 mg by mouth daily.    . rosuvastatin (CRESTOR) 5 MG tablet Take 5 mg by mouth daily.    . SYMBICORT 160-4.5 MCG/ACT inhaler Inhale 1 puff into the lungs as needed.     . Vitamin D, Ergocalciferol, (DRISDOL) 50000 UNITS CAPS capsule Take 50,000 Units by mouth daily.     Current Facility-Administered Medications on File Prior to Visit  Medication Dose Route Frequency Provider Last Rate Last Admin  . triamcinolone acetonide (KENALOG) 10 MG/ML injection 10 mg  10 mg Other Once Landis Martins, DPM        Allergies  Allergen Reactions  . Ace  Inhibitors Cough  . Penicillins Itching, Swelling and Rash    Has patient had a PCN reaction causing immediate rash, facial/tongue/throat swelling, SOB or lightheadedness with hypotension: Yes Has patient had a PCN reaction causing severe rash involving mucus membranes or skin necrosis: Yes Has patient had a PCN reaction that required hospitalization No Has patient had a PCN reaction occurring within the last 10 years: No If all of the above answers are "NO", then may proceed with Cephalosporin use.     Objective:  General: Alert and oriented x3 in no acute distress  Dermatology: No open lesions bilateral lower extremities, no webspace macerations, no ecchymosis bilateral, all nails x 10 are well manicured but there is severe thickening noted at the second  toenails bilateral with mild subungual debris that is slowly improving.  Vascular: Dorsalis Pedis and Posterior Tibial pedal pulses palpable, Capillary Fill Time 3 seconds,(+) pedal hair growth bilateral, trace edema bilateral lower extremities, Temperature gradient within normal limits.  Neurology: Johney Maine sensation intact via light touch bilateral.  Musculoskeletal: Decreased tenderness with palpation at left second metatarsophalangeal joint.  Long second toe bilateral left greater than right. Pes planus foot type.  Assessment and Plan: Problem List Items Addressed This Visit    None    Visit Diagnoses    Capsulitis    -  Primary   Left foot pain       Neuritis       Long toe       Metatarsalgia of both feet       Pes planus of both feet       Nail dystrophy       Pain around toenail       Ankle swelling, unspecified laterality       Diabetes mellitus without complication ([facility])         -Complete examination performed -Recommend custom orthotics to continue with offloading the metatarsal heads bilateral; Dawn to call patient to discuss benefits -Advised patient to continue with edema control of use of her compression garments and Lasix -Advised patient to continue with Toeclyn nail gel as previously instructed -Return to office for orthotics or sooner if problems or issues arise. Landis Martins, DPM

## 2019-09-01 HISTORY — PX: UPPER GI ENDOSCOPY: SHX6162

## 2019-09-01 HISTORY — PX: OTHER SURGICAL HISTORY: SHX169

## 2019-09-24 ENCOUNTER — Other Ambulatory Visit: Payer: Self-pay

## 2019-09-24 ENCOUNTER — Encounter: Payer: Self-pay | Admitting: Sports Medicine

## 2019-09-24 ENCOUNTER — Ambulatory Visit (INDEPENDENT_AMBULATORY_CARE_PROVIDER_SITE_OTHER): Payer: BC Managed Care – PPO | Admitting: Sports Medicine

## 2019-09-24 DIAGNOSIS — M2141 Flat foot [pes planus] (acquired), right foot: Secondary | ICD-10-CM

## 2019-09-24 DIAGNOSIS — M7741 Metatarsalgia, right foot: Secondary | ICD-10-CM

## 2019-09-24 DIAGNOSIS — M779 Enthesopathy, unspecified: Secondary | ICD-10-CM

## 2019-09-24 DIAGNOSIS — M778 Other enthesopathies, not elsewhere classified: Secondary | ICD-10-CM

## 2019-09-24 DIAGNOSIS — M792 Neuralgia and neuritis, unspecified: Secondary | ICD-10-CM

## 2019-09-24 DIAGNOSIS — M79672 Pain in left foot: Secondary | ICD-10-CM

## 2019-09-24 NOTE — Progress Notes (Signed)
Patient discussed with medical assistant. Patient was scanned for CFO's, Rx sent to [facility] lab. Patient to follow up as scheduled for continued care or sooner if problems or issues arise. -Dr. Cannon Kettle

## 2019-09-24 NOTE — Progress Notes (Signed)
Pt came in today (09/24/19) to get scanned for orthotics. Pt states she is already aware of orthotics benefits/coverage. Pt was told orthotics would take about an estimate of 1 mo to come in and we will contact her to pick them up

## 2019-10-03 ENCOUNTER — Telehealth: Payer: Self-pay | Admitting: Obstetrics and Gynecology

## 2019-10-03 ENCOUNTER — Other Ambulatory Visit: Payer: Self-pay | Admitting: Internal Medicine

## 2019-10-03 DIAGNOSIS — Z1231 Encounter for screening mammogram for malignant neoplasm of breast: Secondary | ICD-10-CM

## 2019-10-03 NOTE — Telephone Encounter (Signed)
Spoke with patient. Patient reports she has a left vulvar cyst that was evaluated at 07/08/19 [facility]. Patient reports a noticeable, intermittent pain 5/10 in the area of the cyst when standing for long periods of time. Denies any change in size, no redness, fever/chills, drainage. Denies  pain right now. Requesting OV to re-evaluate, requesting late afternoon appt.   OV scheduled for 10/09/19 at 4:30pm with Dr. Quincy Simmonds. Recommended OTC motrin prn for pain, also discussed applying warm compresses or warm baths for comfort. Return call to office if new symptoms develop or symptoms worsen.   Routing to provider for final review. Patient is agreeable to disposition. Will close encounter.

## 2019-10-03 NOTE — Telephone Encounter (Signed)
Patient has a cyst or "lump" on vaginal area.

## 2019-10-04 ENCOUNTER — Telehealth: Payer: Self-pay

## 2019-10-04 NOTE — Telephone Encounter (Signed)
Spoke with patient. Denies any changes in left vulvar cyst (see 10/03/19 telephone encounter) , request to r/s OV. OV r/s to 9/9 at 4:30pm with Dr. Quincy Simmonds.   Encounter closed.

## 2019-10-04 NOTE — Telephone Encounter (Signed)
Patient called to cancel office visit due to "prior engagement". Patient would like to reschedule.

## 2019-10-09 ENCOUNTER — Ambulatory Visit: Payer: Self-pay | Admitting: Obstetrics and Gynecology

## 2019-10-09 NOTE — Progress Notes (Signed)
GYNECOLOGY  VISIT   HPI: 49 y.o.   Married  [place] American  female   [phone] with Patient's last menstrual period was 07/02/2015 (approximate).   here for left sebaceous cyst. The cyst is aching for 3 weeks, exacerbated with movement.  Not draining.  Not sure if it is getting bigger.   GYNECOLOGIC HISTORY: Patient's last menstrual period was 07/02/2015 (approximate). Contraception:  Hyst Menopausal hormone therapy:  none Last mammogram: 08-30-18 3D/Neg/density B/Birads1 Last pap smear: 01-09-15 ASCUS HPV HR neg, 12-12-13 Neg:Neg Hr HPV, 09-13-12 Neg:Neg HR HPV, Final pathology on cervix at time of hysterectomy was benign.         OB History    Gravida  1   Para  1   Term  1   Preterm      AB      Living  1     SAB      TAB      Ectopic      Multiple      Live Births                 Patient Active Problem List   Diagnosis Date Noted  . 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 Morbid obesity ([place]) 11/22/2012  . OSA on CPAP 11/22/2012  . HTN (hypertension) 11/22/2012  . DOE (dyspnea on exertion) 11/22/2012  . Abnormal EKG 11/22/2012    Past Medical History:  Diagnosis Date  . Abnormal Pap smear of cervix 2006   Leep, colpo  . Anxiety   . Diabetes mellitus without complication ([place])    type 2  . Fibroid   . GERD (gastroesophageal reflux disease)   . History of palpitations    PVCs - wore holter monitor, negative results  . Hypertension   . OSA (obstructive sleep apnea) 06/2010   AHI during total sleep 5.9/hr and REM sleep 28.1/hr (mild OSA) 06/2010, uses CPAP nightly  . Pre-diabetes   . Restless leg syndrome    patient denies this dx  . Seasonal allergies   . STD (sexually transmitted disease)    hx HPV  . SVD (spontaneous vaginal delivery)    x 1    Past Surgical History:  Procedure Laterality Date  . CERVICAL BIOPSY  W/ LOOP ELECTRODE EXCISION  2006  . COLPOSCOPY  2006  . CYSTOSCOPY N/A 07/21/2015    Procedure: CYSTOSCOPY;  Surgeon: [name], MD;  Location: [facility];  Service: Gynecology;  Laterality: N/A;  . ROBOTIC ASSISTED TOTAL HYSTERECTOMY WITH SALPINGECTOMY Bilateral 07/21/2015   Procedure: ROBOTIC ASSISTED TOTAL HYSTERECTOMY WITH SALPINGECTOMY;  Surgeon: [name], MD;  Location: [facility];  Service: Gynecology;  Laterality: Bilateral;  . UPPER GI ENDOSCOPY    . WISDOM TOOTH EXTRACTION      Current Outpatient Medications  Medication Sig Dispense Refill  . amLODipine (NORVASC) 10 MG tablet Take 10 mg by mouth daily.    . baclofen (LIORESAL) 10 MG tablet Take 10 mg by mouth as needed for muscle spasms.    . Dulaglutide (TRULICITY [place]) Inject into the skin.    . DULoxetine (CYMBALTA) 60 MG capsule Take 1 capsule by mouth daily before supper.     . fexofenadine (ALLEGRA) 180 MG tablet Take 180 mg by mouth daily as needed.     . fluticasone (FLONASE) 50 MCG/ACT nasal spray SMARTSIG:2 Spray(s) Both Nares Every Morning    . furosemide (LASIX) 20 MG tablet Take 20  mg by mouth 2 (two) times daily as needed.    Marland Kitchen losartan-hydrochlorothiazide (HYZAAR) 100-25 MG tablet Take 1 tablet by mouth daily. Reported on 07/11/2015    . metFORMIN (GLUCOPHAGE) 500 MG tablet Take 500 mg by mouth 2 (two) times daily.    . montelukast (SINGULAIR) 10 MG tablet Take 10 mg by mouth at bedtime.     . Multiple Vitamin (MULTIVITAMIN) capsule Take 1 capsule by mouth daily.    [name] VERIO test strip 1 each 2 (two) times daily.    Marland Kitchen oxybutynin (DITROPAN-XL) 10 MG 24 hr tablet Take 10 mg by mouth daily.    . pantoprazole (PROTONIX) 40 MG tablet Take 40 mg by mouth daily.     . potassium chloride (KLOR-CON) 10 MEQ tablet Take 10 mEq by mouth daily.    . propranolol ER (INDERAL LA) 160 MG SR capsule Take 160 mg by mouth daily.    . rosuvastatin (CRESTOR) 5 MG tablet Take 5 mg by mouth daily.    . SYMBICORT 160-4.5 MCG/ACT inhaler Inhale 1 puff into the lungs as needed.     . Vitamin D,  Ergocalciferol, (DRISDOL) 50000 UNITS CAPS capsule Take 50,000 Units by mouth daily.     Current Facility-Administered Medications  Medication Dose Route Frequency Provider Last Rate Last Admin  . triamcinolone acetonide (KENALOG) 10 MG/ML injection 10 mg  10 mg Other Once Landis Martins, DPM         ALLERGIES: Ace inhibitors, Sulfur, and Penicillins  Family History  Problem Relation Age of Onset  . Hypertension Mother   . Diabetes Father   . Hypertension Father   . Ovarian cancer Maternal Grandmother   . Hypertension Maternal Grandmother   . Stroke Maternal Grandfather   . Diabetes Paternal Grandmother   . Hypertension Paternal Grandmother   . Alzheimer's disease Paternal Grandfather   . Cancer Maternal Aunt        dec bladder ca  . Breast cancer Paternal Aunt   . Breast cancer Cousin     Social History   Socioeconomic History  . Marital status: Married    Spouse name: Not on file  . Number of children: 1  . Years of education: 40  . Highest education level: Not on file  Occupational History  . Occupation: Environmental consultant - [employer]    Employer: [employer]  Tobacco Use  . Smoking status: Never Smoker  . Smokeless tobacco: Never Used  Vaping Use  . Vaping Use: Never used  Substance and Sexual Activity  . Alcohol use: No    Alcohol/week: 0.0 standard drinks  . Drug use: No  . Sexual activity: Yes    Partners: Male    Birth control/protection: Surgical    Comment: R-TLH/Bil.salingectomy  Other Topics Concern  . Not on file  Social History Narrative  . Not on file   Social Determinants of Health   Financial Resource Strain:   . Difficulty of Paying Living Expenses: Not on file  Food Insecurity:   . Worried About Charity fundraiser in the Last Year: Not on file  . Ran Out of Food in the Last Year: Not on file  Transportation Needs:   . Lack of Transportation (Medical): Not on file  . Lack of Transportation (Non-Medical): Not on file  Physical  Activity:   . Days of Exercise per Week: Not on file  . Minutes of Exercise per Session: Not on file  Stress:   . Feeling of Stress :  Not on file  Social Connections:   . Frequency of Communication with Friends and Family: Not on file  . Frequency of Social Gatherings with Friends and Family: Not on file  . Attends Religious Services: Not on file  . Active Member of Clubs or Organizations: Not on file  . Attends Archivist Meetings: Not on file  . Marital Status: Not on file  Intimate Partner Violence:   . Fear of Current or Ex-Partner: Not on file  . Emotionally Abused: Not on file  . Physically Abused: Not on file  . Sexually Abused: Not on file    Review of Systems  Constitutional: Negative.   HENT: Negative.   Eyes: Negative.   Respiratory: Negative.   Cardiovascular: Negative.   Gastrointestinal: Negative.   Endocrine: Negative.   Genitourinary: Negative.   Musculoskeletal: Negative.   Skin: Negative.   Allergic/Immunologic: Negative.   Neurological: Negative.   Hematological: Negative.   Psychiatric/Behavioral: Negative.     PHYSICAL EXAMINATION:    BP 126/72 (BP Location: Left Arm, Patient Position: Sitting, Cuff Size: Normal)   Pulse 72   Resp 14   Ht 5' 4.5" (1.638 m)   Wt 235 lb (106.6 kg)   LMP 07/02/2015 (Approximate)   BMI 39.71 kg/m     General appearance: alert, cooperative and appears stated age  Pelvic: External genitalia:  Left inferior labia majora - sebaceous cyst 15 mm.          Chaperone was present for exam.  ASSESSMENT  Enlarging left labia sebaceous cyst.   PLAN  Will plan for surgical excision in outpatient setting.  Patient declines drainage today due to the possibility of recurrence if the cyst wall is not removed. Risks of surgical exicision include but are not limited to bleeding, infection, reaction to anesthesia.

## 2019-10-10 ENCOUNTER — Other Ambulatory Visit: Payer: Self-pay

## 2019-10-10 ENCOUNTER — Ambulatory Visit: Payer: BC Managed Care – PPO | Admitting: Obstetrics and Gynecology

## 2019-10-10 ENCOUNTER — Encounter: Payer: Self-pay | Admitting: Obstetrics and Gynecology

## 2019-10-10 VITALS — BP 126/72 | HR 72 | Resp 14 | Ht 64.5 in | Wt 235.0 lb

## 2019-10-10 DIAGNOSIS — L723 Sebaceous cyst: Secondary | ICD-10-CM

## 2019-10-10 NOTE — Patient Instructions (Signed)
Epidermal Cyst  An epidermal cyst is a small, painless lump under your skin. The cyst contains a grayish-white, bad-smelling substance (keratin). Do not try to pop or open an epidermal cyst yourself. What are the causes?  A blocked hair follicle.  A hair that curls and re-enters the skin instead of growing straight out of the skin.  A blocked pore.  Irritated skin.  An injury to the skin.  Certain conditions that are passed along from parent to child (inherited).  Human papillomavirus (HPV).  Long-term sun damage to the skin. What increases the risk?  Having acne.  Being overweight.  Being 30-40 years old. What are the signs or symptoms? These cysts are usually harmless, but they can get infected. Symptoms of infection may include:  Redness.  Inflammation.  Tenderness.  Warmth.  Fever.  A grayish-white, bad-smelling substance drains from the cyst.  Pus drains from the cyst. How is this treated? In many cases, epidermal cysts go away on their own without treatment. If a cyst becomes infected, treatment may include:  Opening and draining the cyst, done by a doctor. After draining, you may need minor surgery to remove the rest of the cyst.  Antibiotic medicine.  Shots of medicines (steroids) that help to reduce inflammation.  Surgery to remove the cyst. Surgery may be done if the cyst: ? Becomes large. ? Bothers you. ? Has a chance of turning into cancer.  Do not try to open a cyst yourself. Follow these instructions at home:  Take over-the-counter and prescription medicines only as told by your doctor.  If you were prescribed an antibiotic medicine, take it it as told by your doctor. Do not stop using the antibiotic even if you start to feel better.  Keep the area around your cyst clean and dry.  Wear loose, dry clothing.  Avoid touching your cyst.  Check your cyst every day for signs of infection. Check for: ? Redness, swelling, or pain. ? Fluid  or blood. ? Warmth. ? Pus or a bad smell.  Keep all follow-up visits as told by your doctor. This is important. How is this prevented?  Wear clean, dry, clothing.  Avoid wearing tight clothing.  Keep your skin clean and dry. Take showers or baths every day. Contact a doctor if:  Your cyst has symptoms of infection.  Your condition does not improve or gets worse.  You have a cyst that looks different from other cysts you have had.  You have a fever. Get help right away if:  Redness spreads from the cyst into the area close by. Summary  An epidermal cyst is a sac made of skin tissue.  If a cyst becomes infected, treatment may include surgery to open and drain the cyst, or to remove it.  Take over-the-counter and prescription medicines only as told by your doctor.  Contact a doctor if your condition is not improving or is getting worse.  Keep all follow-up visits as told by your doctor. This is important. This information is not intended to replace advice given to you by your health care provider. Make sure you discuss any questions you have with your health care provider. Document Revised: 05/10/2018 Document Reviewed: 10/26/2017 Elsevier Patient Education  2020 Elsevier Inc.  

## 2019-10-13 ENCOUNTER — Telehealth: Payer: Self-pay | Admitting: Obstetrics and Gynecology

## 2019-10-13 NOTE — Telephone Encounter (Signed)
Please precert and schedule outpatient surgery for my patient who needs an excision of a vulvar cyst, which I suspect is a sebaceous cyst.

## 2019-10-15 NOTE — Telephone Encounter (Signed)
Dr.Silva, okay for patient to wait until Dec 20 or 21?

## 2019-10-15 NOTE — Telephone Encounter (Signed)
Spoke with patient regarding surgery benefits. Patient acknowledges understanding of information presented. Patient is aware that benefits presented are for professional benefits only. Patient is aware that once surgery is scheduled, the hospital will call with separate benefits. Patient is aware of surgery cancellation policy.  Patient is requesting surgery date on December 20th or 21st, due to being a Education officer, museum. Advised patient that date would have to be confirmed with Dr. Quincy Simmonds due to possible holiday plans. Patient agreeable.

## 2019-10-16 NOTE — Telephone Encounter (Signed)
Left message to call Dailah Opperman at [phone]. 

## 2019-10-16 NOTE — Telephone Encounter (Signed)
Please check to see if she has another time available before December.  Her cyst did enlarge a lot since I saw her last.  I expect her time out of work to be the day of surgery and the following day only.

## 2019-10-17 NOTE — Telephone Encounter (Signed)
Spoke with patient. Surgery scheduled for 11/11/2019 at 8:45 am at [facility]. Pre op scheduled for 10/24/2019 at 4:30 pm with Dr.Silva. COVID test scheduled for 11/07/2019 at 3 pm at [facility] location. Patient is aware of the need to quarantine after test until surgery. 2 week post op scheduled for 11/27/2019 at 4:30 pm with Dr.Silva. Surgery instructions reviewed and packet to be given at her pre op appointment. Folder to [employer] to provide patient.   Routing to provider and will close encounter.

## 2019-10-21 ENCOUNTER — Ambulatory Visit
Admission: RE | Admit: 2019-10-21 | Discharge: 2019-10-21 | Disposition: A | Discharge status: Home / Self Care | Payer: BC Managed Care – PPO | Source: Ambulatory Visit | Attending: Internal Medicine | Admitting: Internal Medicine

## 2019-10-21 ENCOUNTER — Other Ambulatory Visit: Payer: Self-pay

## 2019-10-21 DIAGNOSIS — Z1231 Encounter for screening mammogram for malignant neoplasm of breast: Secondary | ICD-10-CM

## 2019-10-23 NOTE — Progress Notes (Signed)
GYNECOLOGY  VISIT   HPI: 49 y.o.   Married  [place] American  female   [phone] with Patient's last menstrual period was 07/02/2015 (approximate).   here for surgical consult.   The vulvar cyst is bothersome and she desires surgical removal.   Colonoscopy done in September 3.   She had an endoscopy also.  She will have an appointment at [facility] in follow up.   GYNECOLOGIC HISTORY: Patient's last menstrual period was 07/02/2015 (approximate). Contraception: Hyst Menopausal hormone therapy:  none Last mammogram: 10-21-19  3D/Neg/density B/BiRads1 Last pap smear:  01-09-15 ASCUS HPV HR neg, 12-12-13 Neg:Neg Hr HPV, 09-13-12 Neg:Neg HR HPV,Final pathology on cervix at time of hysterectomy was benign.        OB History    Gravida  1   Para  1   Term  1   Preterm      AB      Living  1     SAB      TAB      Ectopic      Multiple      Live Births                 Patient Active Problem List   Diagnosis Date Noted  . 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 Morbid obesity ([place]) 11/22/2012  . OSA on CPAP 11/22/2012  . HTN (hypertension) 11/22/2012  . DOE (dyspnea on exertion) 11/22/2012  . Abnormal EKG 11/22/2012    Past Medical History:  Diagnosis Date  . Abnormal Pap smear of cervix 2006   Leep, colpo  . Anxiety   . Diabetes mellitus without complication ([place])    type 2  . Fibroid   . GERD (gastroesophageal reflux disease)   . History of palpitations    PVCs - wore holter monitor, negative results  . Hypertension   . OSA (obstructive sleep apnea) 06/2010   AHI during total sleep 5.9/hr and REM sleep 28.1/hr (mild OSA) 06/2010, uses CPAP nightly  . Pre-diabetes   . Restless leg syndrome    patient denies this dx  . Seasonal allergies   . STD (sexually transmitted disease)    hx HPV  . SVD (spontaneous vaginal delivery)    x 1    Past Surgical History:  Procedure Laterality Date  . CERVICAL BIOPSY  W/  LOOP ELECTRODE EXCISION  2006  . COLPOSCOPY  2006  . CYSTOSCOPY N/A 07/21/2015   Procedure: CYSTOSCOPY;  Surgeon: [name], MD;  Location: [facility];  Service: Gynecology;  Laterality: N/A;  . ROBOTIC ASSISTED TOTAL HYSTERECTOMY WITH SALPINGECTOMY Bilateral 07/21/2015   Procedure: ROBOTIC ASSISTED TOTAL HYSTERECTOMY WITH SALPINGECTOMY;  Surgeon: [name], MD;  Location: [facility];  Service: Gynecology;  Laterality: Bilateral;  . UPPER GI ENDOSCOPY    . WISDOM TOOTH EXTRACTION      Current Outpatient Medications  Medication Sig Dispense Refill  . amLODipine (NORVASC) 10 MG tablet Take 10 mg by mouth daily.    . baclofen (LIORESAL) 10 MG tablet Take 10 mg by mouth as needed for muscle spasms.    . Dulaglutide (TRULICITY [place]) Inject into the skin.    . DULoxetine (CYMBALTA) 60 MG capsule Take 1 capsule by mouth daily before supper.     . fexofenadine (ALLEGRA) 180 MG tablet Take 180 mg by mouth daily as needed.     . fluticasone (FLONASE) 50 MCG/ACT nasal spray SMARTSIG:2 Spray(s)  Both Nares Every Morning    . furosemide (LASIX) 20 MG tablet Take 20 mg by mouth 2 (two) times daily as needed.    Marland Kitchen losartan-hydrochlorothiazide (HYZAAR) 100-25 MG tablet Take 1 tablet by mouth daily. Reported on 07/11/2015    . metFORMIN (GLUCOPHAGE) 500 MG tablet Take 500 mg by mouth 2 (two) times daily.    . montelukast (SINGULAIR) 10 MG tablet Take 10 mg by mouth at bedtime.     . Multiple Vitamin (MULTIVITAMIN) capsule Take 1 capsule by mouth daily.    [name] VERIO test strip 1 each 2 (two) times daily.    Marland Kitchen oxybutynin (DITROPAN-XL) 10 MG 24 hr tablet Take 10 mg by mouth daily.    . pantoprazole (PROTONIX) 40 MG tablet Take 40 mg by mouth daily.     . potassium chloride (KLOR-CON) 10 MEQ tablet Take 10 mEq by mouth daily.    . propranolol ER (INDERAL [place]) 160 MG SR capsule Take 160 mg by mouth daily.    . rosuvastatin (CRESTOR) 5 MG tablet Take 5 mg by mouth daily.    . SYMBICORT  160-4.5 MCG/ACT inhaler Inhale 1 puff into the lungs as needed.     . Vitamin D, Ergocalciferol, (DRISDOL) 50000 UNITS CAPS capsule Take 50,000 Units by mouth daily.     Current Facility-Administered Medications  Medication Dose Route Frequency Provider Last Rate Last Admin  . triamcinolone acetonide (KENALOG) 10 MG/ML injection 10 mg  10 mg Other Once Landis Martins, DPM         ALLERGIES: Ace inhibitors, Sulfur, and Penicillins  Family History  Problem Relation Age of Onset  . Hypertension Mother   . Diabetes Father   . Hypertension Father   . Ovarian cancer Maternal Grandmother   . Hypertension Maternal Grandmother   . Stroke Maternal Grandfather   . Diabetes Paternal Grandmother   . Hypertension Paternal Grandmother   . Alzheimer's disease Paternal Grandfather   . Cancer Maternal Aunt        dec bladder ca  . Breast cancer Paternal Aunt   . Breast cancer Cousin     Social History   Socioeconomic History  . Marital status: Married    Spouse name: Not on file  . Number of children: 1  . Years of education: 33  . Highest education level: Not on file  Occupational History  . Occupation: Environmental consultant - [employer]    Employer: [employer]  Tobacco Use  . Smoking status: Never Smoker  . Smokeless tobacco: Never Used  Vaping Use  . Vaping Use: Never used  Substance and Sexual Activity  . Alcohol use: No    Alcohol/week: 0.0 standard drinks  . Drug use: No  . Sexual activity: Yes    Partners: Male    Birth control/protection: Surgical    Comment: R-TLH/Bil.salingectomy  Other Topics Concern  . Not on file  Social History Narrative  . Not on file   Social Determinants of Health   Financial Resource Strain:   . Difficulty of Paying Living Expenses: Not on file  Food Insecurity:   . Worried About Charity fundraiser in the Last Year: Not on file  . Ran Out of Food in the Last Year: Not on file  Transportation Needs:   . Lack of Transportation (Medical):  Not on file  . Lack of Transportation (Non-Medical): Not on file  Physical Activity:   . Days of Exercise per Week: Not on file  . Minutes  of Exercise per Session: Not on file  Stress:   . Feeling of Stress : Not on file  Social Connections:   . Frequency of Communication with Friends and Family: Not on file  . Frequency of Social Gatherings with Friends and Family: Not on file  . Attends Religious Services: Not on file  . Active Member of Clubs or Organizations: Not on file  . Attends Archivist Meetings: Not on file  . Marital Status: Not on file  Intimate Partner Violence:   . Fear of Current or Ex-Partner: Not on file  . Emotionally Abused: Not on file  . Physically Abused: Not on file  . Sexually Abused: Not on file    Review of Systems  Constitutional: Negative.   HENT: Negative.   Eyes: Negative.   Respiratory: Negative.   Cardiovascular: Negative.   Gastrointestinal: Negative.   Endocrine: Negative.   Genitourinary: Negative.   Musculoskeletal: Negative.   Skin: Negative.   Allergic/Immunologic: Negative.   Neurological: Negative.   Hematological: Negative.   Psychiatric/Behavioral: Negative.     PHYSICAL EXAMINATION:    BP 116/70 (BP Location: Left Arm, Patient Position: Sitting, Cuff Size: Normal)   Pulse 80   Ht 5' 4.5" (1.638 m)   Wt 235 lb (106.6 kg)   LMP 07/02/2015 (Approximate)   BMI 39.71 kg/m     General appearance: alert, cooperative and appears stated age Head: Normocephalic, without obvious abnormality, atraumatic Neck: no adenopathy, supple, symmetrical, trachea midline and thyroid normal to inspection and palpation Lungs: clear to auscultation bilaterally Heart: regular rate and rhythm Abdomen: soft, non-tender, no organomegaly.  5 x 7 cm soft tissue protrusion above umbilicus, nontender. Extremities: extremities normal, atraumatic, no cyanosis or edema Skin: Skin color, texture, turgor normal. No rashes or lesions No abnormal  inguinal nodes palpated Neurologic: Grossly normal  Pelvic: External genitalia:  no lesions         Chaperone was present for exam.  ASSESSMENT  Left inferior labia majora 15 cm sebaceous cyst.  Ventral hernia versus rectus diastasis.   PLAN  Will proceed with surgical excision of left labial cyst.   Risks, benefits, and alternatives reviewed.  Risks may include but are not limited to bleeding, infection, damage to surrounding organs, recurrence of the cyst if the entire cyst wall is not removed, reaction to anesthesia, DVT, PE, death.  The patient who wishes to proceed.  Surgical expectations and recovery discussed.   I separately discussed the possible ventral hernia versus diastasis recti.   She will observe for pain and enlargement of the area which may indicate bowel strangulation.  I offered referral to general surgery versus CT of abdomen first followed by referral.  She will consider how she would like to proceed forward.

## 2019-10-24 ENCOUNTER — Ambulatory Visit (INDEPENDENT_AMBULATORY_CARE_PROVIDER_SITE_OTHER): Payer: BC Managed Care – PPO | Admitting: Obstetrics and Gynecology

## 2019-10-24 ENCOUNTER — Other Ambulatory Visit: Payer: Self-pay

## 2019-10-24 ENCOUNTER — Encounter: Payer: Self-pay | Admitting: Obstetrics and Gynecology

## 2019-10-24 VITALS — BP 116/70 | HR 80 | Ht 64.5 in | Wt 235.0 lb

## 2019-10-24 DIAGNOSIS — R222 Localized swelling, mass and lump, trunk: Secondary | ICD-10-CM

## 2019-10-24 DIAGNOSIS — L723 Sebaceous cyst: Secondary | ICD-10-CM

## 2019-10-24 NOTE — Patient Instructions (Signed)
Hernia, Adult     A hernia is the bulging of an organ or tissue through a weak spot in the muscles of the abdomen (abdominal wall). Hernias develop most often near the belly button (navel) or the area where the leg meets the lower abdomen (groin). Common types of hernias include:  Incisional hernia. This type bulges through a scar from an abdominal surgery.  Umbilical hernia. This type develops near the navel.  Inguinal hernia. This type develops in the groin or scrotum.  Femoral hernia. This type develops under the groin, in the upper thigh area.  Hiatal hernia. This type occurs when part of the stomach slides above the muscle that separates the abdomen from the chest (diaphragm). What are the causes? This condition may be caused by:  Heavy lifting.  Coughing over a long period of time.  Straining to have a bowel movement. Constipation can lead to straining.  An incision made during an abdominal surgery.  A physical problem that is present at birth (congenital defect).  Being overweight or obese.  Smoking.  Excess fluid in the abdomen.  Undescended testicles in males. What are the signs or symptoms? The main symptom is a skin-colored, rounded bulge in the area of the hernia. However, a bulge may not always be present. It may grow bigger or be more visible when you cough or strain (such as when lifting something heavy). A hernia that can be pushed back into the area (is reducible) rarely causes pain. A hernia that cannot be pushed back into the area (is incarcerated) may lose its blood supply (become strangulated). A hernia that is incarcerated may cause:  Pain.  Fever.  Nausea and vomiting.  Swelling.  Constipation. How is this diagnosed? A hernia may be diagnosed based on:  Your symptoms and medical history.  A physical exam. Your health care provider may ask you to cough or move in certain ways to see if the hernia becomes visible.  Imaging tests, such  as: ? X-rays. ? Ultrasound. ? CT scan. How is this treated? A hernia that is small and painless may not need to be treated. A hernia that is large or painful may be treated with surgery. Inguinal hernias may be treated with surgery to prevent incarceration or strangulation. Strangulated hernias are always treated with surgery because a lack of blood supply to the trapped organ or tissue can cause it to die. Surgery to treat a hernia involves pushing the bulge back into place and repairing the weak area of the muscle or abdominal wall. Follow these instructions at home: Activity  Avoid straining.  Do not lift anything that is heavier than 10 lb (4.5 kg), or the limit that you are told, until your health care provider says that it is safe.  When lifting heavy objects, lift with your leg muscles, not your back muscles. Preventing constipation  Take actions to prevent constipation. Constipation leads to straining with bowel movements, which can make a hernia worse or cause a hernia repair to break down. Your health care provider may recommend that you: ? Drink enough fluid to keep your urine pale yellow. ? Eat foods that are high in fiber, such as fresh fruits and vegetables, whole grains, and beans. ? Limit foods that are high in fat and processed sugars, such as fried or sweet foods. ? Take an over-the-counter or prescription medicine for constipation. General instructions  When coughing, try to cough gently.  You may try to push the hernia back in place   by very gently pressing on it while lying down. Do not try to force the bulge back in if it will not push in easily.  If you are overweight, work with your health care provider to lose weight safely.  Do not use any products that contain nicotine or tobacco, such as cigarettes and e-cigarettes. If you need help quitting, ask your health care provider.  If you are scheduled for hernia repair, watch your hernia for any changes in shape,  size, or color. Tell your health care provider about any changes or new symptoms.  Take over-the-counter and prescription medicines only as told by your health care provider.  Keep all follow-up visits as told by your health care provider. This is important. Contact a health care provider if:  You develop new pain, swelling, or redness around your hernia.  You have signs of constipation, such as: ? Fewer bowel movements in a week than normal. ? Difficulty having a bowel movement. ? Stools that are dry, hard, or larger than normal. Get help right away if:  You have a fever.  You have abdomen pain that gets worse.  You feel nauseous or you vomit.  You cannot push the hernia back in place by very gently pressing on it while lying down. Do not try to force the bulge back in if it will not push in easily.  The hernia: ? Changes in shape, size, or color. ? Feels hard or tender. These symptoms may represent a serious problem that is an emergency. Do not wait to see if the symptoms will go away. Get medical help right away. Call your local emergency services (911 in the [place] Summary  A hernia is the bulging of an organ or tissue through a weak spot in the muscles of the abdomen (abdominal wall).  The main symptom is a skin-colored, rounded lump (bulge) in the hernia area. However, a bulge may not always be present. It may grow bigger or more visible when you cough or strain (such as when having a bowel movement).  A hernia that is small and painless may not need to be treated. A hernia that is large or painful may be treated with surgery.  Surgery to treat a hernia involves pushing the bulge back into place and repairing the weak part of the abdomen. This information is not intended to replace advice given to you by your health care provider. Make sure you discuss any questions you have with your health care provider. Document Revised: 05/10/2018 Document Reviewed: 10/19/2016 Elsevier  Patient Education  [place]  Diastasis recti is when the muscles of the abdomen (rectus abdominis muscles) become thin and separate. The result is a wider space between the right and left abdomen (abdominal) muscles. This wider space between the muscles may cause a bulge in the middle of your abdomen. You may notice this bulge when you are straining or when you sit up from a lying down position. Diastasis recti can affect men and women. It is most common among pregnant women, infants, people who are obese, and people who have had abdominal surgery. Exercise or surgical treatment may help correct it. What are the causes? Common causes of this condition include:  Pregnancy. The growing uterus puts pressure on the abdominal muscles, which causes the muscles to separate.  Obesity. Excess fat puts pressure on abdominal muscles.  Weightlifting.  Some abdomen exercises.  Advanced age.  Genetics.  Prior abdominal surgery. What increases the risk?  This condition is more likely to develop in:  Women.  Newborns, especially newborns who are born early (prematurely). What are the signs or symptoms? Common symptoms of this condition include:  A bulge in the middle of the abdomen. You will notice it most when you sit up or strain.  Pain in the low back, pelvis, or hips.  Constipation.  Inability to control when you urinate (urinary incontinence).  Bloating.  Poor posture. How is this diagnosed? This condition is diagnosed with a physical exam. Your health care provider will ask you to lie flat on your back and do a crunch or half sit-up. If you have diastasis recti, a vertical bulge will appear between your abdominal muscles in the center of your abdomen. Your health care provider will measure the gap between your muscles with one of the following:  A medical device used to measure the space between two objects (caliper).  A tape measure.  CT  scan.  Ultrasound.  Finger spaces. Your health care provider will measure the space using their fingers. How is this treated? If your muscle separation is not too large, you may not need treatment. However, if you are a woman who plans to become pregnant again, you should treat this condition before your next pregnancy. Treatment may include:  Physical therapy to strengthen and tighten your abdominal muscles.  Lifestyle changes such as weight loss and exercise.  Over-the-counter pain medicines as needed.  Surgery to correct the separation. Follow these instructions at home: Activity  Return to your normal activities as told by your health care provider. Ask your health care provider what activities are safe for you.  When lifting weights or doing exercises using your abdominal muscles or the muscles in the center of your body that give stability (core muscles), make sure you are doing your exercises and movements correctly. Proper form can help to prevent the condition from happening again. General instructions  If you are overweight, ask your health care provider for help with weight loss. Losing even a small amount of weight can help to improve your diastasis recti.  Take over-the-counter or prescription medicines only as told by your health care provider.  Do not strain. Straining can make the separation worse. Examples of straining include: ? Pushing hard to have a bowel movement, such as due to constipation. ? Lifting heavy objects, including children. ? Standing up and sitting down.  Take steps to prevent constipation: ? Drink enough fluid to keep your urine clear or pale yellow. ? Take over-the-counter or prescription medicines only as directed. ? Eat foods that are high in fiber, such as fresh fruits and vegetables, whole grains, and beans. ? Limit foods that are high in fat and processed sugars, such as fried and sweet foods. Contact a health care provider if:  You  notice a new bulge in your abdomen. Get help right away if:  You experience severe discomfort in your abdomen.  You develop severe abdominal pain along with nausea, vomiting, or fever. Summary  Diastasis recti is when the abdomen (abdominal) muscles become thin and separate. Your abdomen will stick out because the space between your right and left abdomen muscles has widened.  The most common symptom is a bulge in your abdomen. You will notice it most when you sit up or are straining.  This condition is diagnosed during a physical exam.  If the abdomen separation is not too big, you may choose not to have treatment. Otherwise, you may need to  undergo physical therapy or surgery. This information is not intended to replace advice given to you by your health care provider. Make sure you discuss any questions you have with your health care provider. Document Revised: 12/30/2016 Document Reviewed: 03/14/2016 Elsevier Patient Education  2020 [employer].

## 2019-10-25 ENCOUNTER — Ambulatory Visit: Payer: BC Managed Care – PPO | Admitting: Orthotics

## 2019-10-25 ENCOUNTER — Encounter: Payer: Self-pay | Admitting: Orthotics

## 2019-10-25 DIAGNOSIS — M779 Enthesopathy, unspecified: Secondary | ICD-10-CM

## 2019-10-25 DIAGNOSIS — M792 Neuralgia and neuritis, unspecified: Secondary | ICD-10-CM

## 2019-10-25 NOTE — Progress Notes (Signed)
Patient came in today to p/up functional foot orthotics.   The orthotics were assessed to both fit and function.  The F/O addressed the biomechanical issues/pathologies as intended, offering good longitudinal arch support, proper offloading, and foot support. There weren't any signs of discomfort or irritation.  The F/O fit properly in footwear with minimal trimming/adjustments. 

## 2019-10-25 NOTE — Patient Instructions (Signed)

## 2019-10-29 ENCOUNTER — Telehealth: Payer: Self-pay | Admitting: Gastroenterology

## 2019-10-29 NOTE — Telephone Encounter (Signed)
Left message on machine to call back  

## 2019-10-29 NOTE — Telephone Encounter (Signed)
Krista [name] this pt has never been seen here before not sure if this is the right chart?

## 2019-10-29 NOTE — Telephone Encounter (Signed)
Krista [name] returned call from [place] and advised that she can only get abd of CT scheduled.  She will have that sent to our office for review when completed

## 2019-11-05 NOTE — H&P (Signed)
Office Visit  10/24/2019 Hamel Silva, Everardo All, MD Obstetrics and Gynecology  Sebaceous cyst +1 more Dx  Office VIsit ; Referred by [name], MD Reason for Visit  Additional Documentation  Vitals:  BP 116/70 (BP Location: Left Arm, Patient Position: Sitting, Cuff Size: Normal)  Pulse 80  Ht 5' 4.5" (1.638 m)  Wt 106.6 kg  LMP 07/02/2015 (Approximate)  BMI 39.71 kg/m  BSA 2.2 m    More Vitals  Flowsheets:  NEWS,  MEWS Score,  Anthropometrics,  Method of Visit    Encounter Info:  Billing Info,  History,  Allergies,  Detailed Report    Orthostatic Vitals Recorded in This Encounter   10/24/2019  1618     Patient Position: Sitting  BP Location: Left Arm  Cuff Size: Normal  All Notes   Progress Notes by [name], MD at 10/24/2019 4:30 PM Author: [name], MD Author Type: Physician Filed: 10/27/2019  8:30 PM  Note Status: Signed Cosign: Cosign Not Required Encounter Date: 10/24/2019  Editor: [name], MD (Physician)      Prior Versions: 1. Archie Balboa CMA (Certified Psychologist, sport and exercise) at 10/24/2019  4:23 PM - Sign when Signing Visit   2. Lowella Fairy, CMA (Certified Psychologist, sport and exercise) at 10/24/2019 11:47 AM - Sign when Signing Visit      GYNECOLOGY  VISIT   HPI: 49 y.o.   Married  [place]  female   [phone] with Patient's last menstrual period was 07/02/2015 (approximate).   here for surgical consult.    The vulvar cyst is bothersome and she desires surgical removal.    Colonoscopy done in September 3.   She had an endoscopy also.  She will have an appointment at [facility] in follow up.    GYNECOLOGIC HISTORY: Patient's last menstrual period was 07/02/2015 (approximate). Contraception: Hyst Menopausal hormone therapy:  none Last mammogram: 10-21-19  3D/Neg/density B/BiRads1 Last pap smear:  01-09-15 ASCUS HPV HR neg, 12-12-13 Neg:Neg  Hr HPV, 09-13-12 Neg:Neg HR HPV,  Final pathology on cervix at time of hysterectomy was benign.                 OB History     Gravida  1   Para  1   Term  1   Preterm      AB      Living  1      SAB      TAB      Ectopic      Multiple      Live Births                        Patient Active Problem List    Diagnosis Date Noted  . 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 Morbid obesity ([place]) 11/22/2012  . OSA on CPAP 11/22/2012  . HTN (hypertension) 11/22/2012  . DOE (dyspnea on exertion) 11/22/2012  . Abnormal EKG 11/22/2012          Past Medical History:  Diagnosis Date  . Abnormal Pap smear of cervix 2006    Leep, colpo  . Anxiety    . Diabetes mellitus without complication ([place])      type 2  . Fibroid    . GERD (gastroesophageal reflux disease)    . History of palpitations      PVCs -  wore holter monitor, negative results  . Hypertension    . OSA (obstructive sleep apnea) 06/2010    AHI during total sleep 5.9/hr and REM sleep 28.1/hr (mild OSA) 06/2010, uses CPAP nightly  . Pre-diabetes    . Restless leg syndrome      patient denies this dx  . Seasonal allergies    . STD (sexually transmitted disease)      hx HPV  . SVD (spontaneous vaginal delivery)      x 1           Past Surgical History:  Procedure Laterality Date  . CERVICAL BIOPSY  W/ LOOP ELECTRODE EXCISION   2006  . COLPOSCOPY   2006  . CYSTOSCOPY N/A 07/21/2015    Procedure: CYSTOSCOPY;  Surgeon: [name], MD;  Location: [facility];  Service: Gynecology;  Laterality: N/A;  . ROBOTIC ASSISTED TOTAL HYSTERECTOMY WITH SALPINGECTOMY Bilateral 07/21/2015    Procedure: ROBOTIC ASSISTED TOTAL HYSTERECTOMY WITH SALPINGECTOMY;  Surgeon: [name], MD;  Location: [facility];  Service: Gynecology;  Laterality: Bilateral;  . UPPER GI ENDOSCOPY      . WISDOM TOOTH EXTRACTION                Current Outpatient Medications    Medication Sig Dispense Refill  . amLODipine (NORVASC) 10 MG tablet Take 10 mg by mouth daily.      . baclofen (LIORESAL) 10 MG tablet Take 10 mg by mouth as needed for muscle spasms.      . Dulaglutide (TRULICITY [place]) Inject into the skin.      . DULoxetine (CYMBALTA) 60 MG capsule Take 1 capsule by mouth daily before supper.       . fexofenadine (ALLEGRA) 180 MG tablet Take 180 mg by mouth daily as needed.       . fluticasone (FLONASE) 50 MCG/ACT nasal spray SMARTSIG:2 Spray(s) Both Nares Every Morning      . furosemide (LASIX) 20 MG tablet Take 20 mg by mouth 2 (two) times daily as needed.      Marland Kitchen losartan-hydrochlorothiazide (HYZAAR) 100-25 MG tablet Take 1 tablet by mouth daily. Reported on 07/11/2015      . metFORMIN (GLUCOPHAGE) 500 MG tablet Take 500 mg by mouth 2 (two) times daily.      . montelukast (SINGULAIR) 10 MG tablet Take 10 mg by mouth at bedtime.       . Multiple Vitamin (MULTIVITAMIN) capsule Take 1 capsule by mouth daily.      [name] VERIO test strip 1 each 2 (two) times daily.      Marland Kitchen oxybutynin (DITROPAN-XL) 10 MG 24 hr tablet Take 10 mg by mouth daily.      . pantoprazole (PROTONIX) 40 MG tablet Take 40 mg by mouth daily.       . potassium chloride (KLOR-CON) 10 MEQ tablet Take 10 mEq by mouth daily.      . propranolol ER (INDERAL LA) 160 MG SR capsule Take 160 mg by mouth daily.      . rosuvastatin (CRESTOR) 5 MG tablet Take 5 mg by mouth daily.      . SYMBICORT 160-4.5 MCG/ACT inhaler Inhale 1 puff into the lungs as needed.       . Vitamin D, Ergocalciferol, (DRISDOL) 50000 UNITS CAPS capsule Take 50,000 Units by mouth daily.                 Current Facility-Administered Medications  Medication Dose Route Frequency Provider  Last Rate Last Admin  . triamcinolone acetonide (KENALOG) 10 MG/ML injection 10 mg  10 mg Other Once Landis Martins, DPM          ALLERGIES: Ace inhibitors, Sulfur, and Penicillins        Family History  Problem Relation Age of Onset  .  Hypertension Mother    . Diabetes Father    . Hypertension Father    . Ovarian cancer Maternal Grandmother    . Hypertension Maternal Grandmother    . Stroke Maternal Grandfather    . Diabetes Paternal Grandmother    . Hypertension Paternal Grandmother    . Alzheimer's disease Paternal Grandfather    . Cancer Maternal Aunt          dec bladder ca  . Breast cancer Paternal Aunt    . Breast cancer Cousin        Social History         Socioeconomic History  . Marital status: Married      Spouse name: Not on file  . Number of children: 1  . Years of education: 97  . Highest education level: Not on file  Occupational History  . Occupation: Environmental consultant - [employer]      Employer: [employer]  Tobacco Use  . Smoking status: Never Smoker  . Smokeless tobacco: Never Used  Vaping Use  . Vaping Use: Never used  Substance and Sexual Activity  . Alcohol use: No      Alcohol/week: 0.0 standard drinks  . Drug use: No  . Sexual activity: Yes      Partners: Male      Birth control/protection: Surgical      Comment: R-TLH/Bil.salingectomy  Other Topics Concern  . Not on file  Social History Narrative  . Not on file    Social Determinants of Health       Financial Resource Strain:   . Difficulty of Paying Living Expenses: Not on file  Food Insecurity:   . Worried About Charity fundraiser in the Last Year: Not on file  . Ran Out of Food in the Last Year: Not on file  Transportation Needs:   . Lack of Transportation (Medical): Not on file  . Lack of Transportation (Non-Medical): Not on file  Physical Activity:   . Days of Exercise per Week: Not on file  . Minutes of Exercise per Session: Not on file  Stress:   . Feeling of Stress : Not on file  Social Connections:   . Frequency of Communication with Friends and Family: Not on file  . Frequency of Social Gatherings with Friends and Family: Not on file  . Attends Religious Services: Not on file  . Active Member of  Clubs or Organizations: Not on file  . Attends Archivist Meetings: Not on file  . Marital Status: Not on file  Intimate Partner Violence:   . Fear of Current or Ex-Partner: Not on file  . Emotionally Abused: Not on file  . Physically Abused: Not on file  . Sexually Abused: Not on file      Review of Systems  Constitutional: Negative.   HENT: Negative.   Eyes: Negative.   Respiratory: Negative.   Cardiovascular: Negative.   Gastrointestinal: Negative.   Endocrine: Negative.   Genitourinary: Negative.   Musculoskeletal: Negative.   Skin: Negative.   Allergic/Immunologic: Negative.   Neurological: Negative.   Hematological: Negative.   Psychiatric/Behavioral: Negative.       PHYSICAL  EXAMINATION:     BP 116/70 (BP Location: Left Arm, Patient Position: Sitting, Cuff Size: Normal)   Pulse 80   Ht 5' 4.5" (1.638 m)   Wt 235 lb (106.6 kg)   LMP 07/02/2015 (Approximate)   BMI 39.71 kg/m     General appearance: alert, cooperative and appears stated age Head: Normocephalic, without obvious abnormality, atraumatic Neck: no adenopathy, supple, symmetrical, trachea midline and thyroid normal to inspection and palpation Lungs: clear to auscultation bilaterally Heart: regular rate and rhythm Abdomen: soft, non-tender, no organomegaly.  5 x 7 cm soft tissue protrusion above umbilicus, nontender. Extremities: extremities normal, atraumatic, no cyanosis or edema Skin: Skin color, texture, turgor normal. No rashes or lesions No abnormal inguinal nodes palpated Neurologic: Grossly normal   Pelvic: External genitalia:  no lesions          Chaperone was present for exam.   ASSESSMENT   Left inferior labia majora 15 cm sebaceous cyst.  Ventral hernia versus rectus diastasis.    PLAN   Will proceed with surgical excision of left labial cyst.   Risks, benefits, and alternatives reviewed.  Risks may include but are not limited to bleeding, infection, damage to  surrounding organs, recurrence of the cyst if the entire cyst wall is not removed, reaction to anesthesia, DVT, PE, death.  The patient who wishes to proceed.  Surgical expectations and recovery discussed.    I separately discussed the possible ventral hernia versus diastasis recti.   She will observe for pain and enlargement of the area which may indicate bowel strangulation.  I offered referral to general surgery versus CT of abdomen first followed by referral.  She will consider how she would like to proceed forward.

## 2019-11-06 ENCOUNTER — Telehealth: Payer: Self-pay | Admitting: Gastroenterology

## 2019-11-06 NOTE — Telephone Encounter (Signed)
Requesting CT results.

## 2019-11-06 NOTE — Telephone Encounter (Signed)
The pt has been advised that she needs to call her Primary GI MD for results of CT.  He also has questions about why the EUS is needed.  I directed her to call her primary GI.  The pt has been advised of the information and verbalized understanding.

## 2019-11-07 ENCOUNTER — Inpatient Hospital Stay (HOSPITAL_COMMUNITY): Admission: RE | Admit: 2019-11-07 | Payer: Self-pay | Source: Ambulatory Visit

## 2019-11-07 ENCOUNTER — Encounter (HOSPITAL_BASED_OUTPATIENT_CLINIC_OR_DEPARTMENT_OTHER): Payer: Self-pay | Admitting: Obstetrics and Gynecology

## 2019-11-07 ENCOUNTER — Telehealth: Payer: Self-pay

## 2019-11-07 ENCOUNTER — Other Ambulatory Visit: Payer: Self-pay

## 2019-11-07 ENCOUNTER — Encounter (HOSPITAL_COMMUNITY)
Admission: RE | Admit: 2019-11-07 | Discharge: 2019-11-07 | Disposition: A | Discharge status: Home / Self Care | Payer: BC Managed Care – PPO | Source: Ambulatory Visit | Attending: Obstetrics and Gynecology | Admitting: Obstetrics and Gynecology

## 2019-11-07 DIAGNOSIS — Z01818 Encounter for other preprocedural examination: Secondary | ICD-10-CM | POA: Diagnosis not present

## 2019-11-07 DIAGNOSIS — I10 Essential (primary) hypertension: Secondary | ICD-10-CM | POA: Diagnosis not present

## 2019-11-07 LAB — CBC
HCT: 34.4 % — ABNORMAL LOW (ref 36.0–46.0)
Hemoglobin: 10.9 g/dL — ABNORMAL LOW (ref 12.0–15.0)
MCH: 26.5 pg (ref 26.0–34.0)
MCHC: 31.7 g/dL (ref 30.0–36.0)
MCV: 83.7 fL (ref 80.0–100.0)
Platelets: 301 10*3/uL (ref 150–400)
RBC: 4.11 MIL/uL (ref 3.87–5.11)
RDW: 15 % (ref 11.5–15.5)
WBC: 7.8 10*3/uL (ref 4.0–10.5)
nRBC: 0 % (ref 0.0–0.2)

## 2019-11-07 LAB — BASIC METABOLIC PANEL
Anion gap: 13 (ref 5–15)
BUN: 11 mg/dL (ref 6–20)
CO2: 29 mmol/L (ref 22–32)
Calcium: 9.3 mg/dL (ref 8.9–10.3)
Chloride: 100 mmol/L (ref 98–111)
Creatinine, Ser: 0.65 mg/dL (ref 0.44–1.00)
GFR calc non Af Amer: >60 mL/min (ref >60)
Glucose, Bld: 80 mg/dL (ref 70–99)
Potassium: 3.3 mmol/L — ABNORMAL LOW (ref 3.5–5.1)
Sodium: 142 mmol/L (ref 135–145)

## 2019-11-07 NOTE — Telephone Encounter (Signed)
Spoke with patient. Patient is scheduled for COVID test tomorrow 11/08/2019 at 9:05 am. Patient is agreeable to date and time and aware of address.  Encounter closed.

## 2019-11-07 NOTE — Telephone Encounter (Signed)
Patient went to the wrong location for covid testing. She wants to know what she needs to do to get this done. Surgery is 11/11/19.

## 2019-11-07 NOTE — Progress Notes (Addendum)
Spoke w/ via phone for pre-op interview---pt  Lab needs dos----   none            Lab results------cbc, bmet, ekg done 11-07-2019 [facility] COVID test -----11-08-2019 905 am- Arrive at -------828 am 11-11-2019 NPO after MN NO Solid Food.  Clear liquids from MN until--545 am then npo- Medications to take morning of surgery -----symbicort prn,/bring, duloxetine, propranololer, amlodipine, flonase, pantaprazole, oxybutynin Diabetic medication -----none day of surgery Patient Special Instructions -----bring cpap mask and tubing and leave in car Pre-Op special Istructions -----none Patient verbalized understanding of instructions that were given at this phone interview. Patient denies shortness of breath, chest pain, fever, cough at this phone interview.

## 2019-11-08 ENCOUNTER — Telehealth: Payer: Self-pay | Admitting: Sports Medicine

## 2019-11-08 ENCOUNTER — Other Ambulatory Visit (HOSPITAL_COMMUNITY)
Admission: RE | Admit: 2019-11-08 | Discharge: 2019-11-08 | Disposition: A | Discharge status: Home / Self Care | Payer: BC Managed Care – PPO | Source: Ambulatory Visit | Attending: Obstetrics and Gynecology | Admitting: Obstetrics and Gynecology

## 2019-11-08 ENCOUNTER — Other Ambulatory Visit (HOSPITAL_COMMUNITY): Payer: BC Managed Care – PPO

## 2019-11-08 DIAGNOSIS — Z20822 Contact with and (suspected) exposure to covid-19: Secondary | ICD-10-CM | POA: Insufficient documentation

## 2019-11-08 DIAGNOSIS — M2142 Flat foot [pes planus] (acquired), left foot: Secondary | ICD-10-CM

## 2019-11-08 DIAGNOSIS — M7741 Metatarsalgia, right foot: Secondary | ICD-10-CM

## 2019-11-08 DIAGNOSIS — Z01812 Encounter for preprocedural laboratory examination: Secondary | ICD-10-CM | POA: Insufficient documentation

## 2019-11-08 DIAGNOSIS — M2141 Flat foot [pes planus] (acquired), right foot: Secondary | ICD-10-CM

## 2019-11-08 DIAGNOSIS — M7742 Metatarsalgia, left foot: Secondary | ICD-10-CM

## 2019-11-08 DIAGNOSIS — M779 Enthesopathy, unspecified: Secondary | ICD-10-CM

## 2019-11-08 LAB — SARS CORONAVIRUS 2 (TAT 6-24 HRS): SARS Coronavirus 2: NEGATIVE

## 2019-11-08 NOTE — Telephone Encounter (Signed)
Pt called and left message asking for a call back about the orthotics she picked up.  I returned call and pt wanted to order a second pair if insurance would cover them. Pt has [employer] and they cover 2 pr per yr. She would like another pair just like the ones she got. I told pt I would get Liliane Channel to order and someone would call when they come in for her to pick up.

## 2019-11-11 ENCOUNTER — Ambulatory Visit (HOSPITAL_BASED_OUTPATIENT_CLINIC_OR_DEPARTMENT_OTHER)
Admission: RE | Admit: 2019-11-11 | Discharge: 2019-11-11 | Disposition: A | Discharge status: Home / Self Care | Payer: BC Managed Care – PPO | Attending: Obstetrics and Gynecology | Admitting: Obstetrics and Gynecology

## 2019-11-11 ENCOUNTER — Ambulatory Visit (HOSPITAL_BASED_OUTPATIENT_CLINIC_OR_DEPARTMENT_OTHER): Payer: BC Managed Care – PPO | Admitting: Anesthesiology

## 2019-11-11 ENCOUNTER — Other Ambulatory Visit: Payer: Self-pay

## 2019-11-11 ENCOUNTER — Encounter (HOSPITAL_BASED_OUTPATIENT_CLINIC_OR_DEPARTMENT_OTHER)
Admission: RE | Disposition: A | Discharge status: Home / Self Care | Payer: Self-pay | Source: Home / Self Care | Attending: Obstetrics and Gynecology

## 2019-11-11 ENCOUNTER — Encounter (HOSPITAL_BASED_OUTPATIENT_CLINIC_OR_DEPARTMENT_OTHER): Payer: Self-pay | Admitting: Obstetrics and Gynecology

## 2019-11-11 DIAGNOSIS — E119 Type 2 diabetes mellitus without complications: Secondary | ICD-10-CM | POA: Diagnosis not present

## 2019-11-11 DIAGNOSIS — I10 Essential (primary) hypertension: Secondary | ICD-10-CM | POA: Insufficient documentation

## 2019-11-11 DIAGNOSIS — K219 Gastro-esophageal reflux disease without esophagitis: Secondary | ICD-10-CM | POA: Insufficient documentation

## 2019-11-11 DIAGNOSIS — G4733 Obstructive sleep apnea (adult) (pediatric): Secondary | ICD-10-CM | POA: Insufficient documentation

## 2019-11-11 DIAGNOSIS — Z79899 Other long term (current) drug therapy: Secondary | ICD-10-CM | POA: Insufficient documentation

## 2019-11-11 DIAGNOSIS — L723 Sebaceous cyst: Secondary | ICD-10-CM

## 2019-11-11 DIAGNOSIS — Z7984 Long term (current) use of oral hypoglycemic drugs: Secondary | ICD-10-CM | POA: Insufficient documentation

## 2019-11-11 DIAGNOSIS — J302 Other seasonal allergic rhinitis: Secondary | ICD-10-CM | POA: Diagnosis not present

## 2019-11-11 DIAGNOSIS — F419 Anxiety disorder, unspecified: Secondary | ICD-10-CM | POA: Insufficient documentation

## 2019-11-11 DIAGNOSIS — N907 Vulvar cyst: Secondary | ICD-10-CM | POA: Insufficient documentation

## 2019-11-11 DIAGNOSIS — Z6838 Body mass index (BMI) 38.0-38.9, adult: Secondary | ICD-10-CM | POA: Insufficient documentation

## 2019-11-11 HISTORY — PX: LESION REMOVAL: SHX5196

## 2019-11-11 HISTORY — DX: Type 2 diabetes mellitus without complications: E11.9

## 2019-11-11 HISTORY — DX: Unspecified osteoarthritis, unspecified site: M19.90

## 2019-11-11 HISTORY — DX: Vulvar cyst: N90.7

## 2019-11-11 LAB — GLUCOSE, CAPILLARY: Glucose-Capillary: 106 mg/dL — ABNORMAL HIGH (ref 70–99)

## 2019-11-11 SURGERY — EXCISION, LESION, VAGINA
Anesthesia: Monitor Anesthesia Care | Site: Vulva | Laterality: Left

## 2019-11-11 MED ORDER — MEPERIDINE HCL 25 MG/ML IJ SOLN: 6.2500 mg | INTRAMUSCULAR | Status: DC | PRN

## 2019-11-11 MED ORDER — POVIDONE-IODINE 10 % EX SWAB: 2.0000 "application " | Freq: Once | CUTANEOUS | Status: DC

## 2019-11-11 MED ORDER — METRONIDAZOLE IN NACL 5-0.79 MG/ML-% IV SOLN
500.0000 mg | INTRAVENOUS | Status: AC
  Administered 2019-11-11: 500 mg via INTRAVENOUS

## 2019-11-11 MED ORDER — FENTANYL CITRATE (PF) 100 MCG/2ML IJ SOLN: 25.0000 ug | INTRAMUSCULAR | Status: DC | PRN

## 2019-11-11 MED ORDER — KETOROLAC TROMETHAMINE 30 MG/ML IJ SOLN
INTRAMUSCULAR | Status: AC
  Filled 2019-11-11: qty 1

## 2019-11-11 MED ORDER — IBUPROFEN 800 MG PO TABS: 800.0000 mg | ORAL_TABLET | Freq: Three times a day (TID) | ORAL | 1 refills | Status: DC | PRN

## 2019-11-11 MED ORDER — PROPOFOL 10 MG/ML IV BOLUS
INTRAVENOUS | Status: AC
  Filled 2019-11-11: qty 20

## 2019-11-11 MED ORDER — OXYCODONE HCL 5 MG PO TABS: 5.0000 mg | ORAL_TABLET | Freq: Once | ORAL | Status: DC | PRN

## 2019-11-11 MED ORDER — LIDOCAINE 2% (20 MG/ML) 5 ML SYRINGE
INTRAMUSCULAR | Status: AC
  Filled 2019-11-11: qty 5

## 2019-11-11 MED ORDER — LIDOCAINE-EPINEPHRINE 1 %-1:100000 IJ SOLN
INTRAMUSCULAR | Status: DC | PRN
  Administered 2019-11-11: 1 mL

## 2019-11-11 MED ORDER — ACETAMINOPHEN 160 MG/5ML PO SOLN: 325.0000 mg | ORAL | Status: DC | PRN

## 2019-11-11 MED ORDER — FENTANYL CITRATE (PF) 100 MCG/2ML IJ SOLN
INTRAMUSCULAR | Status: AC
  Filled 2019-11-11: qty 2

## 2019-11-11 MED ORDER — KETOROLAC TROMETHAMINE 30 MG/ML IJ SOLN
INTRAMUSCULAR | Status: DC | PRN
  Administered 2019-11-11: 30 mg via INTRAVENOUS

## 2019-11-11 MED ORDER — PROPOFOL 500 MG/50ML IV EMUL
INTRAVENOUS | Status: DC | PRN
  Administered 2019-11-11: 125 ug/kg/min via INTRAVENOUS

## 2019-11-11 MED ORDER — MIDAZOLAM HCL 5 MG/5ML IJ SOLN
INTRAMUSCULAR | Status: DC | PRN
  Administered 2019-11-11: 2 mg via INTRAVENOUS

## 2019-11-11 MED ORDER — LACTATED RINGERS IV SOLN: INTRAVENOUS | Status: DC

## 2019-11-11 MED ORDER — METRONIDAZOLE IN NACL 5-0.79 MG/ML-% IV SOLN
INTRAVENOUS | Status: AC
  Filled 2019-11-11: qty 100

## 2019-11-11 MED ORDER — ACETAMINOPHEN 325 MG PO TABS: 325.0000 mg | ORAL_TABLET | ORAL | Status: DC | PRN

## 2019-11-11 MED ORDER — CIPROFLOXACIN IN D5W 400 MG/200ML IV SOLN
400.0000 mg | INTRAVENOUS | Status: AC
  Administered 2019-11-11: 400 mg via INTRAVENOUS

## 2019-11-11 MED ORDER — MIDAZOLAM HCL 2 MG/2ML IJ SOLN
INTRAMUSCULAR | Status: AC
  Filled 2019-11-11: qty 2

## 2019-11-11 MED ORDER — ONDANSETRON HCL 4 MG/2ML IJ SOLN
INTRAMUSCULAR | Status: DC | PRN
  Administered 2019-11-11: 4 mg via INTRAVENOUS

## 2019-11-11 MED ORDER — KETOROLAC TROMETHAMINE 15 MG/ML IJ SOLN: 15.0000 mg | Freq: Once | INTRAMUSCULAR | Status: DC

## 2019-11-11 MED ORDER — ONDANSETRON HCL 4 MG/2ML IJ SOLN
INTRAMUSCULAR | Status: AC
  Filled 2019-11-11: qty 2

## 2019-11-11 MED ORDER — PROPOFOL 500 MG/50ML IV EMUL
INTRAVENOUS | Status: AC
  Filled 2019-11-11: qty 50

## 2019-11-11 MED ORDER — LIDOCAINE 2% (20 MG/ML) 5 ML SYRINGE
INTRAMUSCULAR | Status: DC | PRN
  Administered 2019-11-11: 100 mg via INTRAVENOUS

## 2019-11-11 MED ORDER — PROPOFOL 10 MG/ML IV BOLUS
INTRAVENOUS | Status: DC | PRN
  Administered 2019-11-11: 40 mg via INTRAVENOUS

## 2019-11-11 MED ORDER — FENTANYL CITRATE (PF) 100 MCG/2ML IJ SOLN
INTRAMUSCULAR | Status: DC | PRN
  Administered 2019-11-11: 25 ug via INTRAVENOUS

## 2019-11-11 MED ORDER — ONDANSETRON HCL 4 MG/2ML IJ SOLN: 4.0000 mg | Freq: Once | INTRAMUSCULAR | Status: DC | PRN

## 2019-11-11 MED ORDER — CIPROFLOXACIN IN D5W 400 MG/200ML IV SOLN
INTRAVENOUS | Status: AC
  Filled 2019-11-11: qty 200

## 2019-11-11 MED ORDER — DEXAMETHASONE SODIUM PHOSPHATE 10 MG/ML IJ SOLN
INTRAMUSCULAR | Status: AC
  Filled 2019-11-11: qty 1

## 2019-11-11 MED ORDER — OXYCODONE HCL 5 MG/5ML PO SOLN: 5.0000 mg | Freq: Once | ORAL | Status: DC | PRN

## 2019-11-11 SURGICAL SUPPLY — 19 items
APL SWBSTK 6 STRL LF DISP (MISCELLANEOUS)
APPLICATOR COTTON TIP 6 STRL (MISCELLANEOUS) IMPLANT
APPLICATOR COTTON TIP 6IN STRL (MISCELLANEOUS)
BLADE SURG 15 STRL LF DISP TIS (BLADE) IMPLANT
BLADE SURG 15 STRL SS (BLADE)
COVER WAND RF STERILE (DRAPES) ×3 IMPLANT
GLOVE BIO SURGEON STRL SZ 6.5 (GLOVE) ×2 IMPLANT
GLOVE BIO SURGEONS STRL SZ 6.5 (GLOVE) ×1
GOWN STRL REUS W/TWL LRG LVL3 (GOWN DISPOSABLE) ×3 IMPLANT
NS IRRIG 1000ML POUR BTL (IV SOLUTION) ×3 IMPLANT
PACK VAGINAL WOMENS (CUSTOM PROCEDURE TRAY) ×3 IMPLANT
SCOPETTES 8  STERILE (MISCELLANEOUS) ×6
SCOPETTES 8 STERILE (MISCELLANEOUS) ×2 IMPLANT
SUT VIC AB 0 SH 27 (SUTURE) ×2 IMPLANT
SUT VIC AB 2-0 SH 27 (SUTURE)
SUT VIC AB 2-0 SH 27XBRD (SUTURE) IMPLANT
TOWEL OR 17X26 10 PK STRL BLUE (TOWEL DISPOSABLE) ×6 IMPLANT
TUBE CONNECTING 12'X1/4 (SUCTIONS) ×1
TUBE CONNECTING 12X1/4 (SUCTIONS) ×2 IMPLANT

## 2019-11-11 NOTE — Anesthesia Procedure Notes (Signed)
Procedure Name: MAC Date/Time: 11/11/2019 8:30 AM Performed by: Bonney Aid, CRNA Pre-anesthesia Checklist: Patient identified, Emergency Drugs available, Suction available, Patient being monitored and Timeout performed Patient Re-evaluated:Patient Re-evaluated prior to induction Oxygen Delivery Method: Nasal cannula Placement Confirmation: positive ETCO2

## 2019-11-11 NOTE — Transfer of Care (Signed)
Immediate Anesthesia Transfer of Care Note  Patient: Krista [name]  Procedure(s) Performed: EXCISION OF LEFT VULVAR CYST (Left Vulva)  Patient Location: PACU  Anesthesia Type:MAC  Level of Consciousness: awake, alert  and oriented  Airway & Oxygen Therapy: Patient Spontanous Breathing  Post-op Assessment: Report given to RN  Post vital signs: Reviewed and stable  Last Vitals:  Vitals Value Taken Time  BP 124/83   Temp    Pulse 90 11/11/19 0913  Resp 8 11/11/19 0914  SpO2 94 % 11/11/19 0913  Vitals shown include unvalidated device data.  Last Pain:  Vitals:   11/11/19 0700  TempSrc: Oral  PainSc: 0-No pain         Complications: No complications documented.

## 2019-11-11 NOTE — Anesthesia Postprocedure Evaluation (Signed)
Anesthesia Post Note  Patient: Krista [name], Krista [name]  Procedure(s) Performed: EXCISION OF LEFT VULVAR CYST (Left Vulva)     Patient location during evaluation: Phase II Anesthesia Type: MAC Level of consciousness: awake Pain management: pain level controlled Vital Signs Assessment: post-procedure vital signs reviewed and stable Respiratory status: spontaneous breathing Cardiovascular status: stable Postop Assessment: no apparent nausea or vomiting Anesthetic complications: no   No complications documented.  Last Vitals:  Vitals:   11/11/19 0945 11/11/19 1000  BP: 118/81 116/81  Pulse: 71 71  Resp: 15 16  Temp:    SpO2: 96% 94%    Last Pain:  Vitals:   11/11/19 0945  TempSrc:   PainSc: 0-No pain   Pain Goal:                   Huston Foley

## 2019-11-11 NOTE — Op Note (Signed)
OPERATIVE REPORT  PREOPERATIVE DIAGNOSIS:  Left vulvar skin cyst  POSTOPERATIVE DIAGNOSIS:  Left vulvar skin cyst  PROCEDURE:  Excision of left vulvar skin cyst  SURGEON:  Josefa Half, MD  ANESTHESIA:  Propofol, local 1% lidocaine with epinephrine 1:100,000  IVF:  300 cc LR  EBL:  2 cc  COMPLICATIONS:  None.  INDICATIONS FOR SURGERY:  The patient is a 49 year old African American female who presents with an enlarging and painful left vulvar cyst cyst.  The cyst measures approximately 1.5 cm and is a suspected sebaceous cyst.  A plan is made for excision of the cyst after risks, benefits and alternatives are reviewed.  She wishes to proceed.   FINDINGS:  1.5 cm cyst of the left inferior labia majora.   SPECIMEN:  Skin cyst.  PROCEDURE:  The patient was re-identified in the preoperative hold area.  She was escorted to the operative room and placed on the operating room table in the supine position.  She received Flagyl and Ciprofloxacin for antibiotic prophylaxis, and she received Ted hose and PAS stockings for DVT prophylaxis.  Propofol anesthesia was administered.  She was placed in lithotomy position using Allen stirrups.  Her skin was prepped with betadine, and she was sterilely draped.   An exam of the cyst was performed.  The skin and subcutaneous tissue was injected with 1% lidocaine with epinephrine 1:100,000.  The skin and underlying cyst were sharply excised with a scalpel, and the specimen was sent to Pathology. Monopolar cautery was used at the base of the excision site.  Interrupted sutures of 0/0 Vicryl were placed in the subcutaneous layer.  The skin was then closed with interrupted sutures of 2/0 Vicryl.  Hemostasis was good.   The patient was awakened and taken to the recovery room in stable condition.   There were no complications to the procedure.  All needle instrument and sponge counts were correct.    Josefa Half, MD

## 2019-11-11 NOTE — Progress Notes (Signed)
Update to History and Physical  No marked change in status since office preop visit.  She does note some palpitations at times.  Avoids eating red meat.   Patient examined.   Hgb 10.9, K 3.3.  OK to proceed with surgery.   Patient will see her PCP tomorrow and will have evaluation for her anemia at that time.  She will start taking her potassium replacement regularly.

## 2019-11-11 NOTE — Anesthesia Preprocedure Evaluation (Signed)
Anesthesia Evaluation    Airway Mallampati: II       Dental no notable dental hx.    Pulmonary sleep apnea and Continuous Positive Airway Pressure Ventilation ,    Pulmonary exam normal        Cardiovascular hypertension, Pt. on medications Normal cardiovascular exam     Neuro/Psych PSYCHIATRIC DISORDERS Anxiety negative neurological ROS     GI/Hepatic GERD  Medicated and Controlled,  Endo/Other  diabetes, Well Controlled, Type 2, Oral Hypoglycemic Agents  Renal/GU   negative genitourinary   Musculoskeletal   Abdominal Normal abdominal exam  (+)   Peds  Hematology  (+) anemia ,   Anesthesia Other Findings   Reproductive/Obstetrics                             Anesthesia Physical Anesthesia Plan  ASA: II  Anesthesia Plan: MAC   Post-op Pain Management:    Induction:   PONV Risk Score and Plan: TIVA, Ondansetron, Midazolam and Treatment may vary due to age or medical condition  Airway Management Planned: Natural Airway and Simple Face Mask  Additional Equipment: None  Intra-op Plan:   Post-operative Plan:   Informed Consent: I have reviewed the patients History and Physical, chart, labs and discussed the procedure including the risks, benefits and alternatives for the proposed anesthesia with the patient or authorized representative who has indicated his/her understanding and acceptance.       Plan Discussed with: CRNA  Anesthesia Plan Comments:         Anesthesia Quick Evaluation

## 2019-11-11 NOTE — Discharge Instructions (Signed)
Excision of Skin Lesions, Care After This sheet gives you information about how to care for yourself after your procedure. Your health care provider may also give you more specific instructions. If you have problems or questions, contact your health care provider. What can I expect after the procedure? After your procedure, it is common to have pain or discomfort at the excision site. Follow these instructions at home: Excision care   Follow instructions from your health care provider about how to take care of your excision site. Make sure you: ? Wash your hands with soap and water before and after you change your bandage (dressing). If soap and water are not available, use hand sanitizer. ? Change your dressing as told by your health care provider. ? Leave stitches (sutures), skin glue, or adhesive strips in place. These skin closures may need to stay in place for 2 weeks or longer. If adhesive strip edges start to loosen and curl up, you may trim the loose edges. Do not remove adhesive strips completely unless your health care provider tells you to do that.  Check the excision area every day for signs of infection. Watch for: ? Redness, swelling, or pain. ? Fluid or blood. ? Warmth. ? Pus or a bad smell.  Keep the site clean, dry, and protected for at least 48 hours.  For bleeding, apply gentle but firm pressure to the area using a folded towel for 20 minutes.  Avoid high-impact exercise and activities until the sutures are removed or the area heals. General instructions  Take over-the-counter and prescription medicines only as told by your health care provider.  Follow instructions from your health care provider about how to minimize scarring. Scarring should lessen over time.  Avoid sun exposure until the area has healed. Use sunscreen to protect the area from the sun after it has healed.  Keep all follow-up visits as told by your health care provider. This is important. Contact  a health care provider if:  You have redness, swelling, or pain around your excision site.  You have fluid or blood coming from your excision site.  Your excision site feels warm to the touch.  You have pus or a bad smell coming from your excision site.  You have a fever.  You have pain that does not improve in 2-3 days after your procedure.  You notice skin irregularities or changes in how you feel (sensation). Summary  This sheet of instructions provides you with information about caring for yourself after your procedure. Contact your health care provider if you have any problems or questions.  Take over-the-counter and prescription medicines only as told by your health care provider.  Change your dressing as told by your health care provider.  Contact a health care provider if you have redness, swelling, pain, or other signs of infection around your excision site.  Keep all follow-up visits as told by your health care provider. This is important. This information is not intended to replace advice given to you by your health care provider. Make sure you discuss any questions you have with your health care provider. Document Revised: 07/26/2017 Document Reviewed: 07/26/2017 Elsevier Patient Education  [place] Instructions  Activity: Get plenty of rest for the remainder of the day. A responsible individual must stay with you for 24 hours following the procedure.  For the next 24 hours, DO NOT: -Drive a car -Paediatric nurse -Drink alcoholic beverages -Take any medication unless  instructed by your physician -Make any legal decisions or sign important papers.  Meals: Start with liquid foods such as gelatin or soup. Progress to regular foods as tolerated. Avoid greasy, spicy, heavy foods. If nausea and/or vomiting occur, drink only clear liquids until the nausea and/or vomiting subsides. Call your physician if vomiting  continues.  Special Instructions/Symptoms: Your throat may feel dry or sore from the anesthesia or the breathing tube placed in your throat during surgery. If this causes discomfort, gargle with warm salt water. The discomfort should disappear within 24 hours.

## 2019-11-12 ENCOUNTER — Other Ambulatory Visit: Payer: Self-pay

## 2019-11-12 ENCOUNTER — Encounter (HOSPITAL_BASED_OUTPATIENT_CLINIC_OR_DEPARTMENT_OTHER): Payer: Self-pay | Admitting: Obstetrics and Gynecology

## 2019-11-12 ENCOUNTER — Telehealth: Payer: Self-pay

## 2019-11-12 DIAGNOSIS — K3189 Other diseases of stomach and duodenum: Secondary | ICD-10-CM

## 2019-11-12 LAB — SURGICAL PATHOLOGY

## 2019-11-12 NOTE — Telephone Encounter (Signed)
Per outside referral Dr Lyda Jester has asked for EUS for gastric mass.  Dr Ardis Hughs ok'd and the pt has been scheduled for 12/12/19 at 730 am at [place] test on 12/09/19 at 52 am.EUS scheduled, pt instructed and medications reviewed.  Patient instructions mailed to home.  Patient to call with any questions or concerns.

## 2019-11-22 ENCOUNTER — Telehealth: Payer: Self-pay

## 2019-11-22 NOTE — Telephone Encounter (Signed)
CPAP medical supply form signed and faxed back to [facility].  

## 2019-11-26 NOTE — Progress Notes (Signed)
GYNECOLOGY  VISIT   HPI: 49 y.o.   Married  [place] American  female   [phone] with Patient's last menstrual period was 07/02/2015 (approximate).   here for 2 week follow up   EXCISION OF LEFT VULVAR CYST (Left Vulva). Pathology report - Benign sebaceous cyst.  Sutures are present.  Not painful.  Planning an endoscopic ultrasound to evaluate a stomach thickening.   GYNECOLOGIC HISTORY: Patient's last menstrual period was 07/02/2015 (approximate). Contraception:  Hyst Menopausal hormone therapy:  none Last mammogram:  10-21-19  3D/Neg/density B/BiRads1 Last pap smear: 01-09-15 ASCUS HPV HR neg, 12-12-13 Neg:Neg Hr HPV, 09-13-12 Neg:Neg HR HPV,Final pathology on cervix at time of hysterectomy was benign.                OB History    Gravida  1   Para  1   Term  1   Preterm      AB      Living  1     SAB      TAB      Ectopic      Multiple      Live Births                 Patient Active Problem List   Diagnosis Date Noted  . 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 Morbid obesity ([place]) 11/22/2012  . OSA on CPAP 11/22/2012  . HTN (hypertension) 11/22/2012  . DOE (dyspnea on exertion) 11/22/2012  . Abnormal EKG 11/22/2012    Past Medical History:  Diagnosis Date  . Abnormal Pap smear of cervix 2006   Leep, colpo  . Anxiety   . Arthritis   . DM type 2 (diabetes mellitus, type 2) ([place])    type 2  . Fibroid   . GERD (gastroesophageal reflux disease)   . History of palpitations yrs ago   PVCs - wore holter monitor, negative results  . Hypertension   . OSA (obstructive sleep apnea) 06/2010   set on 10  . Restless leg syndrome    patient denies this dx  . Seasonal allergies   . STD (sexually transmitted disease)    hx HPV  . SVD (spontaneous vaginal delivery)    x 1  . Vulvar cyst     Past Surgical History:  Procedure Laterality Date  . CERVICAL BIOPSY  W/ LOOP ELECTRODE EXCISION  2006  . colonscopy   09/2019   polyps return in 5 years  . COLPOSCOPY  2006  . CYSTOSCOPY N/A 07/21/2015   Procedure: CYSTOSCOPY;  Surgeon: [name], MD;  Location: [facility];  Service: Gynecology;  Laterality: N/A;  . LESION REMOVAL Left 11/11/2019   Procedure: EXCISION OF LEFT VULVAR CYST;  Surgeon: [name], MD;  Location: [facility];  Service: Gynecology;  Laterality: Left;  . ROBOTIC ASSISTED TOTAL HYSTERECTOMY WITH SALPINGECTOMY Bilateral 07/21/2015   Procedure: ROBOTIC ASSISTED TOTAL HYSTERECTOMY WITH SALPINGECTOMY;  Surgeon: [name], MD;  Location: [facility];  Service: Gynecology;  Laterality: Bilateral;  . UPPER GI ENDOSCOPY  09/2019  . WISDOM TOOTH EXTRACTION  1990's    Current Outpatient Medications  Medication Sig Dispense Refill  . amLODipine (NORVASC) 10 MG tablet Take 10 mg by mouth daily.    . baclofen (LIORESAL) 10 MG tablet Take 10 mg by mouth as needed for muscle spasms.    . Dulaglutide (TRULICITY [place]) Inject into the skin.  1 shot weekly saturday    . DULoxetine (CYMBALTA) 60 MG capsule Take 1 capsule by mouth daily before supper.     . fexofenadine (ALLEGRA) 180 MG tablet Take 180 mg by mouth daily as needed.     . fluticasone (FLONASE) 50 MCG/ACT nasal spray SMARTSIG:2 Spray(s) Both Nares Every Morning    . furosemide (LASIX) 20 MG tablet Take 20 mg by mouth 2 (two) times daily as needed.    Marland Kitchen ibuprofen (ADVIL) 800 MG tablet Take 1 tablet (800 mg total) by mouth every 8 (eight) hours as needed. 30 tablet 1  . losartan-hydrochlorothiazide (HYZAAR) 100-25 MG tablet Take 1 tablet by mouth daily. Reported on 07/11/2015    . metFORMIN (GLUCOPHAGE) 500 MG tablet Take 500 mg by mouth 2 (two) times daily.    . montelukast (SINGULAIR) 10 MG tablet Take 10 mg by mouth at bedtime.     . Multiple Vitamin (MULTIVITAMIN) capsule Take 1 capsule by mouth daily.    [name] VERIO test strip 1 each 2 (two) times daily.    Marland Kitchen oxybutynin (DITROPAN-XL)  10 MG 24 hr tablet Take 10 mg by mouth daily.    . pantoprazole (PROTONIX) 40 MG tablet Take 40 mg by mouth daily.     . potassium chloride (KLOR-CON) 10 MEQ tablet Take 10 mEq by mouth daily.    . propranolol ER (INDERAL LA) 160 MG SR capsule Take 160 mg by mouth daily.    . rosuvastatin (CRESTOR) 5 MG tablet Take 5 mg by mouth at bedtime.     . SYMBICORT 160-4.5 MCG/ACT inhaler Inhale 1 puff into the lungs as needed.     . Vitamin D, Ergocalciferol, (DRISDOL) 50000 UNITS CAPS capsule Take 50,000 Units by mouth every 7 (seven) days. thursday     Current Facility-Administered Medications  Medication Dose Route Frequency Provider Last Rate Last Admin  . triamcinolone acetonide (KENALOG) 10 MG/ML injection 10 mg  10 mg Other Once Landis Martins, DPM         ALLERGIES: Ace inhibitors, Sulfur, and Penicillins  Family History  Problem Relation Age of Onset  . Hypertension Mother   . Diabetes Father   . Hypertension Father   . Ovarian cancer Maternal Grandmother   . Hypertension Maternal Grandmother   . Stroke Maternal Grandfather   . Diabetes Paternal Grandmother   . Hypertension Paternal Grandmother   . Alzheimer's disease Paternal Grandfather   . Cancer Maternal Aunt        dec bladder ca  . Breast cancer Paternal Aunt   . Breast cancer Cousin     Social History   Socioeconomic History  . Marital status: Married    Spouse name: Not on file  . Number of children: 1  . Years of education: 70  . Highest education level: Not on file  Occupational History  . Occupation: Environmental consultant - [employer]    Employer: [employer]  Tobacco Use  . Smoking status: Never Smoker  . Smokeless tobacco: Never Used  Vaping Use  . Vaping Use: Never used  Substance and Sexual Activity  . Alcohol use: Yes    Alcohol/week: 0.0 standard drinks    Comment: occ  . Drug use: No  . Sexual activity: Yes    Partners: Male    Birth control/protection: Surgical    Comment:  R-TLH/Bil.salingectomy  Other Topics Concern  . Not on file  Social History Narrative  . Not on file   Social Determinants of Health  Financial Resource Strain:   . Difficulty of Paying Living Expenses: Not on file  Food Insecurity:   . Worried About Charity fundraiser in the Last Year: Not on file  . Ran Out of Food in the Last Year: Not on file  Transportation Needs:   . Lack of Transportation (Medical): Not on file  . Lack of Transportation (Non-Medical): Not on file  Physical Activity:   . Days of Exercise per Week: Not on file  . Minutes of Exercise per Session: Not on file  Stress:   . Feeling of Stress : Not on file  Social Connections:   . Frequency of Communication with Friends and Family: Not on file  . Frequency of Social Gatherings with Friends and Family: Not on file  . Attends Religious Services: Not on file  . Active Member of Clubs or Organizations: Not on file  . Attends Archivist Meetings: Not on file  . Marital Status: Not on file  Intimate Partner Violence:   . Fear of Current or Ex-Partner: Not on file  . Emotionally Abused: Not on file  . Physically Abused: Not on file  . Sexually Abused: Not on file    Review of Systems  All other systems reviewed and are negative.   PHYSICAL EXAMINATION:    BP 110/72 (Cuff Size: Large)   Pulse 85   Ht 5' 4.5" (1.638 m)   Wt 234 lb (106.1 kg)   LMP 07/02/2015 (Approximate)   SpO2 98%   BMI 39.55 kg/m     General appearance: alert, cooperative and appears stated age   Pelvic: External genitalia: left vulvar sutures present and removed.                Chaperone was present for exam.  ASSESSMENT  Status post left vulvar sebaceous cyst removal.   PLAN  Surgical findings, procedure, and pathology report reviewed.   Avoid intercourse for 4 weeks.  FU prn.

## 2019-11-27 ENCOUNTER — Encounter: Payer: Self-pay | Admitting: Obstetrics and Gynecology

## 2019-11-27 ENCOUNTER — Other Ambulatory Visit: Payer: Self-pay

## 2019-11-27 ENCOUNTER — Ambulatory Visit (INDEPENDENT_AMBULATORY_CARE_PROVIDER_SITE_OTHER): Payer: BC Managed Care – PPO | Admitting: Obstetrics and Gynecology

## 2019-11-27 VITALS — BP 110/72 | HR 85 | Ht 64.5 in | Wt 234.0 lb

## 2019-11-27 DIAGNOSIS — Z9889 Other specified postprocedural states: Secondary | ICD-10-CM

## 2019-11-27 DIAGNOSIS — Z4802 Encounter for removal of sutures: Secondary | ICD-10-CM

## 2019-11-27 NOTE — Telephone Encounter (Signed)
The pt wanted to change her COVID appt to the day prior to the procedure. I advised her that we have to follow the protocol that has been set by the hospital.  She asked if everyone that has had procedures have had to follow the same requirements.  I did confirm to her that all patients follow the same instructions.  She thanked me for calling and states she will call back if she decides to cancel.

## 2019-11-27 NOTE — Progress Notes (Signed)
Patient called about get covid appointment changed for her procedure on 11/11. Talked with debi Mays and she agreed that patient can go on 11/10 and have her covid test done that morning for her procedure on 11/11. Appointment made in computer by Truddie Coco for 11/10 at 0930.  Made patient aware of new appointment and plan

## 2019-11-27 NOTE — Telephone Encounter (Signed)
Patient called states she needs to change her Covid appointment

## 2019-12-05 ENCOUNTER — Telehealth: Payer: Self-pay | Admitting: Orthotics

## 2019-12-05 NOTE — Telephone Encounter (Signed)
Left a voicemail to let the patient know that her orthotics are in. Since it is a duplicate pair she could just come by and pick them up. But if she wanted to see Liliane Channel, she could call back to make an appt.

## 2019-12-09 ENCOUNTER — Other Ambulatory Visit (HOSPITAL_COMMUNITY): Payer: BC Managed Care – PPO

## 2019-12-11 ENCOUNTER — Other Ambulatory Visit (HOSPITAL_COMMUNITY)
Admission: RE | Admit: 2019-12-11 | Discharge: 2019-12-11 | Disposition: A | Discharge status: Home / Self Care | Payer: BC Managed Care – PPO | Source: Ambulatory Visit | Attending: Gastroenterology | Admitting: Gastroenterology

## 2019-12-11 DIAGNOSIS — R1013 Epigastric pain: Secondary | ICD-10-CM | POA: Diagnosis not present

## 2019-12-11 DIAGNOSIS — Z8052 Family history of malignant neoplasm of bladder: Secondary | ICD-10-CM | POA: Diagnosis not present

## 2019-12-11 DIAGNOSIS — K219 Gastro-esophageal reflux disease without esophagitis: Secondary | ICD-10-CM | POA: Diagnosis present

## 2019-12-11 DIAGNOSIS — Z09 Encounter for follow-up examination after completed treatment for conditions other than malignant neoplasm: Secondary | ICD-10-CM | POA: Diagnosis not present

## 2019-12-11 DIAGNOSIS — E119 Type 2 diabetes mellitus without complications: Secondary | ICD-10-CM | POA: Diagnosis not present

## 2019-12-11 DIAGNOSIS — Z803 Family history of malignant neoplasm of breast: Secondary | ICD-10-CM | POA: Diagnosis not present

## 2019-12-11 DIAGNOSIS — Z833 Family history of diabetes mellitus: Secondary | ICD-10-CM | POA: Diagnosis not present

## 2019-12-11 DIAGNOSIS — Z880 Allergy status to penicillin: Secondary | ICD-10-CM | POA: Diagnosis not present

## 2019-12-11 DIAGNOSIS — I10 Essential (primary) hypertension: Secondary | ICD-10-CM | POA: Diagnosis not present

## 2019-12-11 DIAGNOSIS — K3189 Other diseases of stomach and duodenum: Secondary | ICD-10-CM | POA: Diagnosis not present

## 2019-12-11 DIAGNOSIS — Z888 Allergy status to other drugs, medicaments and biological substances status: Secondary | ICD-10-CM | POA: Diagnosis not present

## 2019-12-11 DIAGNOSIS — Z8249 Family history of ischemic heart disease and other diseases of the circulatory system: Secondary | ICD-10-CM | POA: Diagnosis not present

## 2019-12-11 DIAGNOSIS — G4733 Obstructive sleep apnea (adult) (pediatric): Secondary | ICD-10-CM | POA: Diagnosis not present

## 2019-12-11 DIAGNOSIS — Z6837 Body mass index (BMI) 37.0-37.9, adult: Secondary | ICD-10-CM | POA: Diagnosis not present

## 2019-12-11 DIAGNOSIS — Z8041 Family history of malignant neoplasm of ovary: Secondary | ICD-10-CM | POA: Diagnosis not present

## 2019-12-11 DIAGNOSIS — E669 Obesity, unspecified: Secondary | ICD-10-CM | POA: Diagnosis not present

## 2019-12-11 DIAGNOSIS — Z820 Family history of epilepsy and other diseases of the nervous system: Secondary | ICD-10-CM | POA: Diagnosis not present

## 2019-12-11 DIAGNOSIS — Z20822 Contact with and (suspected) exposure to covid-19: Secondary | ICD-10-CM | POA: Diagnosis not present

## 2019-12-11 DIAGNOSIS — Z823 Family history of stroke: Secondary | ICD-10-CM | POA: Diagnosis not present

## 2019-12-11 DIAGNOSIS — Z882 Allergy status to sulfonamides status: Secondary | ICD-10-CM | POA: Diagnosis not present

## 2019-12-11 LAB — SARS CORONAVIRUS 2 (TAT 6-24 HRS): SARS Coronavirus 2: NEGATIVE

## 2019-12-11 NOTE — Anesthesia Preprocedure Evaluation (Addendum)
Anesthesia Evaluation  Patient identified by MRN, date of birth, ID band Patient awake    Reviewed: Allergy & Precautions, NPO status , Patient's Chart, lab work & pertinent test results  History of Anesthesia Complications Negative for: history of anesthetic complications  Airway Mallampati: II  TM Distance: >3 FB Neck ROM: Full    Dental  (+) Dental Advisory Given Braces :   Pulmonary sleep apnea and Continuous Positive Airway Pressure Ventilation ,    Pulmonary exam normal        Cardiovascular hypertension, Pt. on medications Normal cardiovascular exam     Neuro/Psych PSYCHIATRIC DISORDERS Anxiety negative neurological ROS     GI/Hepatic Neg liver ROS, GERD  Controlled and Medicated, Gastric mass    Endo/Other  diabetes, Type 2, Oral Hypoglycemic AgentsMorbid obesity  Renal/GU negative Renal ROS     Musculoskeletal  (+) Arthritis ,   Abdominal (+) + obese,   Peds  Hematology negative hematology ROS (+)   Anesthesia Other Findings Covid test negative   Reproductive/Obstetrics                            Anesthesia Physical Anesthesia Plan  ASA: III  Anesthesia Plan: MAC   Post-op Pain Management:    Induction: Intravenous  PONV Risk Score and Plan: 2 and Propofol infusion and Treatment may vary due to age or medical condition  Airway Management Planned: Nasal Cannula and Natural Airway  Additional Equipment: None  Intra-op Plan:   Post-operative Plan:   Informed Consent: I have reviewed the patients History and Physical, chart, labs and discussed the procedure including the risks, benefits and alternatives for the proposed anesthesia with the patient or authorized representative who has indicated his/her understanding and acceptance.       Plan Discussed with: CRNA and Anesthesiologist  Anesthesia Plan Comments:        Anesthesia Quick Evaluation

## 2019-12-12 ENCOUNTER — Other Ambulatory Visit: Payer: Self-pay

## 2019-12-12 ENCOUNTER — Ambulatory Visit (HOSPITAL_COMMUNITY): Payer: BC Managed Care – PPO | Admitting: Anesthesiology

## 2019-12-12 ENCOUNTER — Encounter (HOSPITAL_COMMUNITY)
Admission: RE | Disposition: A | Discharge status: Home / Self Care | Payer: Self-pay | Source: Home / Self Care | Attending: Gastroenterology

## 2019-12-12 ENCOUNTER — Ambulatory Visit (HOSPITAL_COMMUNITY)
Admission: RE | Admit: 2019-12-12 | Discharge: 2019-12-12 | Disposition: A | Discharge status: Home / Self Care | Payer: BC Managed Care – PPO | Attending: Gastroenterology | Admitting: Gastroenterology

## 2019-12-12 ENCOUNTER — Encounter (HOSPITAL_COMMUNITY): Payer: Self-pay | Admitting: Gastroenterology

## 2019-12-12 DIAGNOSIS — Z6837 Body mass index (BMI) 37.0-37.9, adult: Secondary | ICD-10-CM | POA: Insufficient documentation

## 2019-12-12 DIAGNOSIS — Z8052 Family history of malignant neoplasm of bladder: Secondary | ICD-10-CM | POA: Insufficient documentation

## 2019-12-12 DIAGNOSIS — E669 Obesity, unspecified: Secondary | ICD-10-CM | POA: Insufficient documentation

## 2019-12-12 DIAGNOSIS — K219 Gastro-esophageal reflux disease without esophagitis: Secondary | ICD-10-CM | POA: Insufficient documentation

## 2019-12-12 DIAGNOSIS — E119 Type 2 diabetes mellitus without complications: Secondary | ICD-10-CM | POA: Insufficient documentation

## 2019-12-12 DIAGNOSIS — G4733 Obstructive sleep apnea (adult) (pediatric): Secondary | ICD-10-CM | POA: Insufficient documentation

## 2019-12-12 DIAGNOSIS — Z8249 Family history of ischemic heart disease and other diseases of the circulatory system: Secondary | ICD-10-CM | POA: Insufficient documentation

## 2019-12-12 DIAGNOSIS — Z823 Family history of stroke: Secondary | ICD-10-CM | POA: Insufficient documentation

## 2019-12-12 DIAGNOSIS — K3189 Other diseases of stomach and duodenum: Secondary | ICD-10-CM | POA: Insufficient documentation

## 2019-12-12 DIAGNOSIS — Z8041 Family history of malignant neoplasm of ovary: Secondary | ICD-10-CM | POA: Insufficient documentation

## 2019-12-12 DIAGNOSIS — Z20822 Contact with and (suspected) exposure to covid-19: Secondary | ICD-10-CM | POA: Insufficient documentation

## 2019-12-12 DIAGNOSIS — Z803 Family history of malignant neoplasm of breast: Secondary | ICD-10-CM | POA: Insufficient documentation

## 2019-12-12 DIAGNOSIS — Z882 Allergy status to sulfonamides status: Secondary | ICD-10-CM | POA: Insufficient documentation

## 2019-12-12 DIAGNOSIS — I10 Essential (primary) hypertension: Secondary | ICD-10-CM | POA: Insufficient documentation

## 2019-12-12 DIAGNOSIS — Z833 Family history of diabetes mellitus: Secondary | ICD-10-CM | POA: Insufficient documentation

## 2019-12-12 DIAGNOSIS — Z09 Encounter for follow-up examination after completed treatment for conditions other than malignant neoplasm: Secondary | ICD-10-CM | POA: Insufficient documentation

## 2019-12-12 DIAGNOSIS — Z888 Allergy status to other drugs, medicaments and biological substances status: Secondary | ICD-10-CM | POA: Insufficient documentation

## 2019-12-12 DIAGNOSIS — R1013 Epigastric pain: Secondary | ICD-10-CM | POA: Insufficient documentation

## 2019-12-12 DIAGNOSIS — Z820 Family history of epilepsy and other diseases of the nervous system: Secondary | ICD-10-CM | POA: Insufficient documentation

## 2019-12-12 DIAGNOSIS — Z880 Allergy status to penicillin: Secondary | ICD-10-CM | POA: Insufficient documentation

## 2019-12-12 HISTORY — PX: ESOPHAGOGASTRODUODENOSCOPY (EGD) WITH PROPOFOL: SHX5813

## 2019-12-12 HISTORY — PX: FINE NEEDLE ASPIRATION: SHX5430

## 2019-12-12 HISTORY — PX: EUS: SHX5427

## 2019-12-12 LAB — GLUCOSE, CAPILLARY: Glucose-Capillary: 80 mg/dL (ref 70–99)

## 2019-12-12 SURGERY — UPPER ENDOSCOPIC ULTRASOUND (EUS) LINEAR
Anesthesia: Monitor Anesthesia Care

## 2019-12-12 MED ORDER — PROPOFOL 500 MG/50ML IV EMUL
INTRAVENOUS | Status: DC | PRN
  Administered 2019-12-12: 125 ug/kg/min via INTRAVENOUS

## 2019-12-12 MED ORDER — LACTATED RINGERS IV SOLN: INTRAVENOUS | Status: DC

## 2019-12-12 MED ORDER — MIDAZOLAM HCL 2 MG/2ML IJ SOLN
INTRAMUSCULAR | Status: AC
  Filled 2019-12-12: qty 2

## 2019-12-12 MED ORDER — MIDAZOLAM HCL 2 MG/2ML IJ SOLN
INTRAMUSCULAR | Status: DC | PRN
  Administered 2019-12-12: 2 mg via INTRAVENOUS

## 2019-12-12 MED ORDER — PROPOFOL 10 MG/ML IV BOLUS
INTRAVENOUS | Status: AC
  Filled 2019-12-12: qty 20

## 2019-12-12 MED ORDER — SODIUM CHLORIDE 0.9 % IV SOLN: INTRAVENOUS | Status: DC

## 2019-12-12 MED ORDER — PROPOFOL 10 MG/ML IV BOLUS
INTRAVENOUS | Status: DC | PRN
  Administered 2019-12-12: 30 mg via INTRAVENOUS
  Administered 2019-12-12: 20 mg via INTRAVENOUS
  Administered 2019-12-12: 30 mg via INTRAVENOUS
  Administered 2019-12-12: 20 mg via INTRAVENOUS
  Administered 2019-12-12: 30 mg via INTRAVENOUS
  Administered 2019-12-12: 20 mg via INTRAVENOUS

## 2019-12-12 MED ORDER — PROPOFOL 500 MG/50ML IV EMUL
INTRAVENOUS | Status: AC
  Filled 2019-12-12: qty 50

## 2019-12-12 MED ORDER — LIDOCAINE HCL (CARDIAC) PF 100 MG/5ML IV SOSY
PREFILLED_SYRINGE | INTRAVENOUS | Status: DC | PRN
  Administered 2019-12-12: 80 mg via INTRAVENOUS

## 2019-12-12 NOTE — Anesthesia Postprocedure Evaluation (Signed)
Anesthesia Post Note  Patient: Krista [name], Krista [name]  Procedure(s) Performed: UPPER ENDOSCOPIC ULTRASOUND (EUS) LINEAR (N/A ) FINE NEEDLE ASPIRATION (FNA) LINEAR (N/A )     Patient location during evaluation: PACU Anesthesia Type: MAC Level of consciousness: awake and alert Pain management: pain level controlled Vital Signs Assessment: post-procedure vital signs reviewed and stable Respiratory status: spontaneous breathing, nonlabored ventilation and respiratory function stable Cardiovascular status: stable and blood pressure returned to baseline Anesthetic complications: no   No complications documented.  Last Vitals:  Vitals:   12/12/19 0810 12/12/19 0820  BP: 118/78 109/61  Pulse: 90 84  Resp: 19 14  Temp:    SpO2: 99% 99%    Last Pain:  Vitals:   12/12/19 0820  TempSrc:   PainSc: 0-No pain                 Audry Pili

## 2019-12-12 NOTE — H&P (Signed)
HPI: This is a woman found to have a 1.5cm submucosal lesion in gastric cardia on recent EGD by Dr. Lyda Jester done for  "reflux foul smelling belches despite daily antiacid medicines"    ROS: complete GI ROS as described in HPI, all other review negative.  Constitutional:  No unintentional weight loss   Past Medical History:  Diagnosis Date  . Abnormal Pap smear of cervix 2006   Leep, colpo  . Anxiety   . Arthritis   . DM type 2 (diabetes mellitus, type 2) ([place])    type 2  . Fibroid   . GERD (gastroesophageal reflux disease)   . History of palpitations yrs ago   PVCs - wore holter monitor, negative results  . Hypertension   . OSA (obstructive sleep apnea) 06/2010   set on 10  . Restless leg syndrome    patient denies this dx  . Seasonal allergies   . STD (sexually transmitted disease)    hx HPV  . SVD (spontaneous vaginal delivery)    x 1  . Vulvar cyst     Past Surgical History:  Procedure Laterality Date  . CERVICAL BIOPSY  W/ LOOP ELECTRODE EXCISION  2006  . colonscopy  09/2019   polyps return in 5 years  . COLPOSCOPY  2006  . CYSTOSCOPY N/A 07/21/2015   Procedure: CYSTOSCOPY;  Surgeon: [name], MD;  Location: [facility];  Service: Gynecology;  Laterality: N/A;  . LESION REMOVAL Left 11/11/2019   Procedure: EXCISION OF LEFT VULVAR CYST;  Surgeon: [name], MD;  Location[facility];  Service: Gynecology;  Laterality: Left;  . ROBOTIC ASSISTED TOTAL HYSTERECTOMY WITH SALPINGECTOMY Bilateral 07/21/2015   Procedure: ROBOTIC ASSISTED TOTAL HYSTERECTOMY WITH SALPINGECTOMY;  Surgeon: [name], MD;  Location: [facility];  Service: Gynecology;  Laterality: Bilateral;  . UPPER GI ENDOSCOPY  09/2019  . WISDOM TOOTH EXTRACTION  1990's    Current Facility-Administered Medications  Medication Dose Route Frequency Provider Last Rate Last Admin  . 0.9 %  sodium chloride infusion   Intravenous Continuous Milus Banister, MD        Allergies as of 11/12/2019 - Review Complete 11/11/2019  Allergen Reaction Noted  . Ace inhibitors Cough 11/21/2012  . Sulfur  10/10/2019  . Penicillins Itching, Swelling, and Rash 09/13/2012    Family History  Problem Relation Age of Onset  . Hypertension Mother   . Diabetes Father   . Hypertension Father   . Ovarian cancer Maternal Grandmother   . Hypertension Maternal Grandmother   . Stroke Maternal Grandfather   . Diabetes Paternal Grandmother   . Hypertension Paternal Grandmother   . Alzheimer's disease Paternal Grandfather   . Cancer Maternal Aunt        dec bladder ca  . Breast cancer Paternal Aunt   . Breast cancer Cousin     Social History   Socioeconomic History  . Marital status: Married    Spouse name: Not on file  . Number of children: 1  . Years of education: 17  . Highest education level: Not on file  Occupational History  . Occupation: Environmental consultant - [employer]    Employer: [employer]  Tobacco Use  . Smoking status: Never Smoker  . Smokeless tobacco: Never Used  Vaping Use  . Vaping Use: Never used  Substance and Sexual Activity  . Alcohol use: Yes    Alcohol/week: 0.0 standard drinks  Comment: occ  . Drug use: No  . Sexual activity: Yes    Partners: Male    Birth control/protection: Surgical    Comment: R-TLH/Bil.salingectomy  Other Topics Concern  . Not on file  Social History Narrative  . Not on file   Social Determinants of Health   Financial Resource Strain:   . Difficulty of Paying Living Expenses: Not on file  Food Insecurity:   . Worried About Charity fundraiser in the Last Year: Not on file  . Ran Out of Food in the Last Year: Not on file  Transportation Needs:   . Lack of Transportation (Medical): Not on file  . Lack of Transportation (Non-Medical): Not on file  Physical Activity:   . Days of Exercise per Week: Not on file  . Minutes of Exercise per Session: Not on file  Stress:   . Feeling of  Stress : Not on file  Social Connections:   . Frequency of Communication with Friends and Family: Not on file  . Frequency of Social Gatherings with Friends and Family: Not on file  . Attends Religious Services: Not on file  . Active Member of Clubs or Organizations: Not on file  . Attends Archivist Meetings: Not on file  . Marital Status: Not on file  Intimate Partner Violence:   . Fear of Current or Ex-Partner: Not on file  . Emotionally Abused: Not on file  . Physically Abused: Not on file  . Sexually Abused: Not on file     Physical Exam: LMP 07/02/2015 (Approximate)  Constitutional: generally well-appearing Psychiatric: alert and oriented x3 Abdomen: soft, nontender, nondistended, no obvious ascites, no peritoneal signs, normal bowel sounds No peripheral edema noted in lower extremities  Assessment and plan: 49 y.o. female with with incidental gastric lesion  For upper EUS today  Please see the "Patient Instructions" section for addition details about the plan.  Owens Loffler, MD [facility] 12/12/2019, 7:02 AM

## 2019-12-12 NOTE — Discharge Instructions (Signed)
YOU HAD AN ENDOSCOPIC PROCEDURE TODAY: Refer to the procedure report and other information in the discharge instructions given to you for any specific questions about what was found during the examination. If this information does not answer your questions, please call [place] office at [phone] to clarify.   YOU SHOULD EXPECT: Some feelings of bloating in the abdomen. Passage of more gas than usual. Walking can help get rid of the air that was put into your GI tract during the procedure and reduce the bloating. If you had a lower endoscopy (such as a colonoscopy or flexible sigmoidoscopy) you may notice spotting of blood in your stool or on the toilet paper. Some abdominal soreness may be present for a day or two, also.  DIET: Your first meal following the procedure should be a light meal and then it is ok to progress to your normal diet. A half-sandwich or bowl of soup is an example of a good first meal. Heavy or fried foods are harder to digest and may make you feel nauseous or bloated. Drink plenty of fluids but you should avoid alcoholic beverages for 24 hours. If you had a esophageal dilation, please see attached instructions for diet.    ACTIVITY: Your care partner should take you home directly after the procedure. You should plan to take it easy, moving slowly for the rest of the day. You can resume normal activity the day after the procedure however YOU SHOULD NOT DRIVE, use power tools, machinery or perform tasks that involve climbing or major physical exertion for 24 hours (because of the sedation medicines used during the test).   SYMPTOMS TO REPORT IMMEDIATELY: A gastroenterologist can be reached at any hour. Please call [phone]  for any of the following symptoms:   Following upper endoscopy (EGD, EUS, ERCP, esophageal dilation) Vomiting of blood or coffee ground material  New, significant abdominal pain  New, significant chest pain or pain under the shoulder blades  Painful or  persistently difficult swallowing  New shortness of breath  Black, tarry-looking or red, bloody stools  FOLLOW UP:  If any biopsies were taken you will be contacted by phone or by letter within the next 1-3 weeks. Call [phone]  if you have not heard about the biopsies in 3 weeks.  Please also call with any specific questions about appointments or follow up tests.  

## 2019-12-12 NOTE — Op Note (Signed)
[facility] Patient Name: Krista [name] Procedure Date: 12/12/2019 MRN: [id number] Attending MD: Milus Banister , MD Date of Birth: 1970-06-11 CSN: [id number] Age: 49 Admit Type: Outpatient Procedure:                Upper EUS Indications:              EGD 10/2019 for reflux, dyspepsia found proximal                            gastric subepithelial lesion Providers:                Milus Banister, MD, Cleda Daub, RN, Benetta Spar, Technician, Laverda Sorenson, Technician,                            Hedy Camara Referring MD:             Sandria Senter, MD Medicines:                Monitored Anesthesia Care Complications:            No immediate complications. Estimated blood loss:                            None. Estimated Blood Loss:     Estimated blood loss: none. Procedure:                Pre-Anesthesia Assessment:                           - Prior to the procedure, a History and Physical                            was performed, and patient medications and                            allergies were reviewed. The patient's tolerance of                            previous anesthesia was also reviewed. The risks                            and benefits of the procedure and the sedation                            options and risks were discussed with the patient.                            All questions were answered, and informed consent                            was obtained. Prior Anticoagulants: The patient has                            taken no previous  anticoagulant or antiplatelet                            agents. ASA Grade Assessment: III - A patient with                            severe systemic disease. After reviewing the risks                            and benefits, the patient was deemed in                            satisfactory condition to undergo the procedure.                           After obtaining informed  consent, the endoscope was                            passed under direct vision. Throughout the                            procedure, the patient's blood pressure, pulse, and                            oxygen saturations were monitored continuously. The                            [id number] ([id number]) Olympus Linear EUS was                            introduced through the mouth, and advanced to the                            second part of duodenum. The GIF-[id number] ([id number]) was                            introduced through the mouth, and advanced to the                            second part of duodenum. The upper EUS was                            accomplished without difficulty. The patient                            tolerated the procedure well. Scope In: Scope Out: Findings:      ENDOSCOPIC FINDING (with adult gastrocope and linear EUS scope): :      The examined esophagus was endoscopically normal.      The entire examined stomach was endoscopically normal.      The examined duodenum was endoscopically normal.      ENDOSONOGRAPHIC FINDING (with linear EUS scope): :      1. An oval intramural (subepithelial) lesion was found in the cardia of       the  stomach about 1cm from the GE junction. The lesion was hypoechoic,       heterogeneous, measured 1.7cm by 1.5cm and clearly communicated with the       muscularis propria layer of the proixmal gastric wall. There was no       local adenopathy. Fine needle aspiration for cytology was performed.       Color Doppler imaging was utilized prior to needle puncture to confirm a       lack of significant vascular structures within the needle path. Two       passes were made with the 25 gauge needle using a transgastric approach.       Final cytology results are pending.      2. The echolayering of the gastric wall was otherwise normal.      3. Limited views of the pancreas, spleen, liver, CBD were all normal. Impression:               - 1.5cm by  1.7cm ovoid, clearly demarcated proximal                            gastric subepithelial lesion with clear                            communication to the muscularis propria layer. 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 Await final cytology results. Moderate Sedation:      Not Applicable - Patient had care per Anesthesia. Recommendation:           - Discharge patient to home. Procedure Code(s):        --- Professional ---                           [phone], Esophagogastroduodenoscopy, flexible,                            transoral; with transendoscopic ultrasound-guided                            intramural or transmural fine needle                            aspiration/biopsy(s), (includes endoscopic                            ultrasound examination limited to the esophagus,                            stomach or duodenum, and adjacent structures) Diagnosis Code(s):        --- Professional ---  K31.89, Other diseases of stomach and duodenum CPT copyright 2019 American Medical Association. All rights reserved. The codes documented in this report are preliminary and upon coder review may  be revised to meet current compliance requirements. Milus Banister, MD 12/12/2019 8:12:07 AM This report has been signed electronically. Number of Addenda: 0

## 2019-12-12 NOTE — Transfer of Care (Signed)
Immediate Anesthesia Transfer of Care Note  Patient: Metro Kung Tye  Procedure(s) Performed: UPPER ENDOSCOPIC ULTRASOUND (EUS) LINEAR (N/A ) FINE NEEDLE ASPIRATION (FNA) LINEAR (N/A )  Patient Location: Endoscopy Unit  Anesthesia Type:MAC  Level of Consciousness: awake, alert , oriented, drowsy and patient cooperative  Airway & Oxygen Therapy: Patient Spontanous Breathing and Patient connected to face mask oxygen  Post-op Assessment: Report given to RN and Post -op Vital signs reviewed and stable  Post vital signs: Reviewed and stable  Last Vitals:  Vitals Value Taken Time  BP 128/80   Temp    Pulse 90 12/12/19 0808  Resp 22 12/12/19 0808  SpO2 100 % 12/12/19 0808  Vitals shown include unvalidated device data.  Last Pain:  Vitals:   12/12/19 0707  TempSrc: Oral  PainSc: 0-No pain         Complications: No complications documented.

## 2019-12-16 ENCOUNTER — Encounter (HOSPITAL_COMMUNITY): Payer: Self-pay | Admitting: Gastroenterology

## 2019-12-16 LAB — CYTOLOGY - NON PAP

## 2020-01-02 ENCOUNTER — Telehealth: Payer: Self-pay | Admitting: Gastroenterology

## 2020-01-02 NOTE — Telephone Encounter (Signed)
EUS report and Path report refaxed as requested.

## 2020-01-15 ENCOUNTER — Ambulatory Visit (INDEPENDENT_AMBULATORY_CARE_PROVIDER_SITE_OTHER): Payer: BC Managed Care – PPO | Admitting: Sports Medicine

## 2020-01-15 ENCOUNTER — Other Ambulatory Visit: Payer: Self-pay

## 2020-01-15 ENCOUNTER — Encounter: Payer: Self-pay | Admitting: Sports Medicine

## 2020-01-15 DIAGNOSIS — L603 Nail dystrophy: Secondary | ICD-10-CM

## 2020-01-15 DIAGNOSIS — M779 Enthesopathy, unspecified: Secondary | ICD-10-CM | POA: Diagnosis not present

## 2020-01-15 DIAGNOSIS — Q742 Other congenital malformations of lower limb(s), including pelvic girdle: Secondary | ICD-10-CM

## 2020-01-15 DIAGNOSIS — M79672 Pain in left foot: Secondary | ICD-10-CM

## 2020-01-15 DIAGNOSIS — M792 Neuralgia and neuritis, unspecified: Secondary | ICD-10-CM

## 2020-01-15 DIAGNOSIS — M2142 Flat foot [pes planus] (acquired), left foot: Secondary | ICD-10-CM

## 2020-01-15 DIAGNOSIS — M7742 Metatarsalgia, left foot: Secondary | ICD-10-CM

## 2020-01-15 DIAGNOSIS — M7741 Metatarsalgia, right foot: Secondary | ICD-10-CM

## 2020-01-15 DIAGNOSIS — M2141 Flat foot [pes planus] (acquired), right foot: Secondary | ICD-10-CM

## 2020-01-15 MED ORDER — TRIAMCINOLONE ACETONIDE 10 MG/ML IJ SUSP
10.0000 mg | Freq: Once | INTRAMUSCULAR | Status: AC
  Administered 2020-01-15: 18:00:00 10 mg

## 2020-01-15 NOTE — Progress Notes (Signed)
Subjective: Krista [name] is a 49 y.o. female patient who returns office for follow-up evaluation of foot pain.  Patient reports that her toe got bad about 3 to 4 weeks ago when she called for the appointment was very sore 8 out of 10 with soreness to the ball of the foot.  Patient reports that she got another set of orthotics in her second.  Seems to not be helpful and makes the pain worse so she stopped wearing them and states that her first pair she got she has misplaced them but the first pair seem to fit very well and was made differently.  Reports that she has been consistent with using topical medication to her toenail but has not seen much of a difference.  No other pedal complaints noted.  Fasting blood sugar not recorded.   Patient Active Problem List   Diagnosis Date Noted  . 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 Morbid obesity ([place]) 11/22/2012  . OSA on CPAP 11/22/2012  . HTN (hypertension) 11/22/2012  . DOE (dyspnea on exertion) 11/22/2012  . Abnormal EKG 11/22/2012    Current Outpatient Medications on File Prior to Visit  Medication Sig Dispense Refill  . amLODipine (NORVASC) 10 MG tablet Take 10 mg by mouth daily.    . baclofen (LIORESAL) 10 MG tablet Take 10 mg by mouth daily as needed for muscle spasms.     . Dulaglutide (TRULICITY) 1.5 WG/9.5AO SOPN Inject 1.5 mg into the skin every Saturday.    . DULoxetine (CYMBALTA) 60 MG capsule Take 60 mg by mouth daily.     . fexofenadine (ALLEGRA) 180 MG tablet Take 180 mg by mouth daily.     . fluticasone (FLONASE) 50 MCG/ACT nasal spray Place 2 sprays into both nostrils daily.     Marland Kitchen losartan-hydrochlorothiazide (HYZAAR) 100-25 MG tablet Take 1 tablet by mouth daily.     . metFORMIN (GLUCOPHAGE) 500 MG tablet Take 500 mg by mouth 2 (two) times daily.    . montelukast (SINGULAIR) 10 MG tablet Take 10 mg by mouth at bedtime.     . Multiple Vitamin (MULTIVITAMIN) capsule Take 1  capsule by mouth daily.    . naproxen sodium (ALEVE) 220 MG tablet Take 220 mg by mouth daily as needed (pain).    [name] VERIO test strip 1 each 2 (two) times daily.    . pantoprazole (PROTONIX) 40 MG tablet Take 40 mg by mouth at bedtime.     . potassium chloride (KLOR-CON) 10 MEQ tablet Take 10 mEq by mouth daily.    . propranolol ER (INDERAL LA) 160 MG SR capsule Take 160 mg by mouth daily.    . rosuvastatin (CRESTOR) 5 MG tablet Take 5 mg by mouth at bedtime.     . SYMBICORT 160-4.5 MCG/ACT inhaler Inhale 2 puffs into the lungs 2 (two) times daily as needed (shortness of breath).     . Vitamin D, Ergocalciferol, (DRISDOL) 50000 UNITS CAPS capsule Take 50,000 Units by mouth every Thursday.      Current Facility-Administered Medications on File Prior to Visit  Medication Dose Route Frequency Provider Last Rate Last Admin  . triamcinolone acetonide (KENALOG) 10 MG/ML injection 10 mg  10 mg Other Once Landis Martins, DPM        Allergies  Allergen Reactions  . Ace Inhibitors Cough  . Sulfur     Itching rash  . Penicillins Itching, Swelling and Rash    Has patient had  a PCN reaction causing immediate rash, facial/tongue/throat swelling, SOB or lightheadedness with hypotension: Yes Has patient had a PCN reaction causing severe rash involving mucus membranes or skin necrosis: Yes Has patient had a PCN reaction that required hospitalization No Has patient had a PCN reaction occurring within the last 10 years: No If all of the above answers are "NO", then may proceed with Cephalosporin use.     Objective:  General: Alert and oriented x3 in no acute distress  Dermatology: No open lesions bilateral lower extremities, no webspace macerations, no ecchymosis bilateral, all nails x 10 are well manicured but there is severe thickening noted at the second toenails bilateral with mild subungual debris that is slowly improving left greater than right.  Vascular: Dorsalis Pedis and Posterior  Tibial pedal pulses palpable, Capillary Fill Time 3 seconds,(+) pedal hair growth bilateral, trace edema bilateral lower extremities, Temperature gradient within normal limits.  Neurology: Johney Maine sensation intact via light touch bilateral.  Musculoskeletal: There is mild to moderate tenderness with palpation at left second metatarsophalangeal joint.  Long second toe bilateral left greater than right. Pes planus foot type.  Assessment and Plan: Problem List Items Addressed This Visit   None   Visit Diagnoses    Capsulitis    -  Primary   Relevant Medications   triamcinolone acetonide (KENALOG) 10 MG/ML injection 10 mg (Completed) (Start on 01/15/2020  6:00 PM)   Neuritis       Left foot pain       Metatarsalgia of both feet       Pes planus of both feet       Long toe       Nail dystrophy         -Complete examination performed -Discussed treatment options for recurrent capsulitis and second toe joint pain on the left -Patient elects at this time to try another steroid shot since previously it helped tremendously -After oral consent and aseptic prep, injected a mixture containing 1 ml of 2%  plain lidocaine, 1 ml 0.5% plain marcaine, 0.5 ml of kenalog 10 and 0.5 ml of dexamethasone phosphate into left second metatarsophalangeal joint without complication. Post-injection care discussed with patient.  -Recommend patient to see Liliane Channel for orthotic readjustment since current orthotics seem to be more painful than helpful -Advised patient to continue with [place] nail gel as previously instructed and reeducated patient on a long process that this would take -Return to office for orthotics modifications or sooner if problems or issues arise. Landis Martins, DPM

## 2020-07-20 NOTE — Progress Notes (Signed)
50 y.o. G24P1001 Married [place] American female here for annual exam.    Patient complaining of hot flashes day and night.  Feels like she is gaining weight.  Vaginal dryness.  Taking OTC Estroven. Had itching with Amberen.   Just recently retired.  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  PCP: [name], MD  Patient's last menstrual period was 07/02/2015 (approximate).           Sexually active: Yes.    The current method of family planning is status post hysterectomy, 2017. Exercising: No.  The patient does not participate in regular exercise at present. Smoker:  no  Health Maintenance: Pap: 01-09-15 ASCUS HPV HR neg, 12-12-13 Neg:Neg Hr HPV, 09-13-12 Neg:Neg HR HPV,  Final pathology on cervix at time of hysterectomy was benign.  History of abnormal Pap: Yes,  LEEP 2006.  LGSIL per patient.  F/U paps normal.   MMG: 10-21-19 3D/Neg/BiRads1 Colonoscopy: 10-04-19 polyp;next due 5 years BMD:   n/a  Result  n/a TDaP: 2013 Gardasil:   no AJO:INOMVEH blood in past Hep C:donated blood in past Screening Labs:  Hb today: PCP   reports that she has never smoked. She has never used smokeless tobacco. She reports current alcohol use. She reports that she does not use drugs.  Past Medical History:  Diagnosis Date   Abnormal Pap smear of cervix 2006   Leep, colpo   Anxiety    Arthritis    DM type 2 (diabetes mellitus, type 2) (HCC)    type 2   Fibroid    GERD (gastroesophageal reflux disease)    History of palpitations yrs ago   PVCs - wore holter monitor, negative results   Hypertension    OSA (obstructive sleep apnea) 06/2010   set on 10   Restless leg syndrome    patient denies this dx   Seasonal allergies    STD (sexually transmitted disease)    hx HPV   SVD (spontaneous vaginal delivery)    x 1   Vulvar cyst     Past Surgical History:  Procedure Laterality Date   CERVICAL BIOPSY  W/ LOOP ELECTRODE EXCISION  2006   colonscopy  09/2019   polyps return in 5  years   COLPOSCOPY  2006   CYSTOSCOPY N/A 07/21/2015   Procedure: CYSTOSCOPY;  Surgeon: [name], MD;  Location: [facility];  Service: Gynecology;  Laterality: N/A;   ESOPHAGOGASTRODUODENOSCOPY (EGD) WITH PROPOFOL N/A 12/12/2019   Procedure: ESOPHAGOGASTRODUODENOSCOPY (EGD) WITH PROPOFOL;  Surgeon: Milus Banister, MD;  Location: [facility];  Service: Endoscopy;  Laterality: N/A;   EUS N/A 12/12/2019   Procedure: UPPER ENDOSCOPIC ULTRASOUND (EUS) LINEAR;  Surgeon: Milus Banister, MD;  Location: [facility];  Service: Endoscopy;  Laterality: N/A;   FINE NEEDLE ASPIRATION N/A 12/12/2019   Procedure: FINE NEEDLE ASPIRATION (FNA) LINEAR;  Surgeon: Milus Banister, MD;  Location: [facility];  Service: Endoscopy;  Laterality: N/A;   LESION REMOVAL Left 11/11/2019   Procedure: EXCISION OF LEFT VULVAR CYST;  Surgeon: [name], MD;  Location: [facility];  Service: Gynecology;  Laterality: Left;   ROBOTIC ASSISTED TOTAL HYSTERECTOMY WITH SALPINGECTOMY Bilateral 07/21/2015   Procedure: ROBOTIC ASSISTED TOTAL HYSTERECTOMY WITH SALPINGECTOMY;  Surgeon: [name], MD;  Location: [facility];  Service: Gynecology;  Laterality: Bilateral;   UPPER GI ENDOSCOPY  09/2019   WISDOM TOOTH EXTRACTION  1990's    Current Outpatient Medications  Medication Sig Dispense Refill   amLODipine (NORVASC) 10 MG tablet Take 10 mg by mouth daily.     baclofen (LIORESAL) 10 MG tablet Take 10 mg by mouth daily as needed for muscle spasms.      Dulaglutide (TRULICITY) 1.5 RA/0.7MA SOPN Inject 1.5 mg into the skin every Saturday.     DULoxetine (CYMBALTA) 60 MG capsule Take 60 mg by mouth daily.      fexofenadine (ALLEGRA) 180 MG tablet Take 180 mg by mouth daily.      fluticasone (FLONASE) 50 MCG/ACT nasal spray Place 2 sprays into both nostrils daily.      furosemide (LASIX) 20 MG tablet Take 20 mg by mouth 2 (two) times daily as needed.      losartan-hydrochlorothiazide (HYZAAR) 100-25 MG tablet Take 1 tablet by mouth daily.      metFORMIN (GLUCOPHAGE) 500 MG tablet Take 500 mg by mouth 2 (two) times daily.     montelukast (SINGULAIR) 10 MG tablet Take 10 mg by mouth at bedtime.      Multiple Vitamin (MULTIVITAMIN) capsule Take 1 capsule by mouth daily.     naproxen sodium (ALEVE) 220 MG tablet Take 220 mg by mouth daily as needed (pain).     ONETOUCH VERIO test strip 1 each 2 (two) times daily.     pantoprazole (PROTONIX) 40 MG tablet Take 40 mg by mouth at bedtime.      potassium chloride (KLOR-CON) 10 MEQ tablet Take 10 mEq by mouth daily.     propranolol ER (INDERAL LA) 160 MG SR capsule Take 160 mg by mouth daily.     rosuvastatin (CRESTOR) 5 MG tablet Take 5 mg by mouth at bedtime.      SYMBICORT 160-4.5 MCG/ACT inhaler Inhale 2 puffs into the lungs 2 (two) times daily as needed (shortness of breath).      Vitamin D, Ergocalciferol, (DRISDOL) 50000 UNITS CAPS capsule Take 50,000 Units by mouth every Thursday.      Current Facility-Administered Medications  Medication Dose Route Frequency Provider Last Rate Last Admin   triamcinolone acetonide (KENALOG) 10 MG/ML injection 10 mg  10 mg Other Once Landis Martins, DPM        Family History  Problem Relation Age of Onset   Hypertension Mother    Diabetes Father    Hypertension Father    Ovarian cancer Maternal Grandmother    Hypertension Maternal Grandmother    Stroke Maternal Grandfather    Diabetes Paternal Grandmother    Hypertension Paternal Grandmother    Alzheimer's disease Paternal Grandfather    Cancer Maternal Aunt        dec bladder ca   Breast cancer Paternal Aunt    Breast cancer Cousin     Review of Systems  All other systems reviewed and are negative.  Exam:   BP 124/60 (Cuff Size: Large)   Pulse 82   Ht 5' 5.5" (1.664 m)   Wt 241 lb (109.3 kg)   LMP 07/02/2015 (Approximate)   SpO2 100%   BMI 39.49 kg/m     General appearance: alert,  cooperative and appears stated age Head: normocephalic, without obvious abnormality, atraumatic Neck: no adenopathy, supple, symmetrical, trachea midline and thyroid normal to inspection and palpation Lungs: clear to auscultation bilaterally Breasts: normal appearance, no masses or tenderness, No nipple retraction or dimpling, No nipple discharge or bleeding, No axillary adenopathy Heart: regular rate and rhythm Abdomen: soft, non-tender; no masses, no organomegaly Extremities: extremities normal, atraumatic, no cyanosis or edema  Skin: skin color, texture, turgor normal. No rashes or lesions Lymph nodes: cervical, supraclavicular, and axillary nodes normal. Neurologic: grossly normal  Pelvic: External genitalia:  no lesions              No abnormal inguinal nodes palpated.              Urethra:  normal appearing urethra with no masses, tenderness or lesions              Bartholins and Skenes: normal                 Vagina: normal appearing vagina with normal color and discharge, no lesions              Cervix: absent              Pap taken: no Bimanual Exam:  Uterus:  absent              Adnexa: no mass, fullness, tenderness              Rectal exam: Yes.  .  Confirms.              Anus:  normal sphincter tone, no lesions  Chaperone was present for exam.  Assessment:   Well woman visit with normal exam. Status post robotic TLH/bilateral salpingectomy.  Ovaries remain.  Hx LEEP for LGSIL per patient.  Final pathology on cervix at hysterectomy - benign.  Left vulvar sebaceous cyst excision.  Menopausal symptoms.  FH ovarian cancer.  MGM. FH breast cancer.  Paternal aunt and cousin. DM. HTN. Anxiety.  On Cymbalta.  Plan: Mammogram screening discussed. Self breast awareness reviewed. Pap and HR HPV as above. Guidelines for Calcium, Vitamin D, regular exercise program including cardiovascular and weight bearing exercise. Check FSH and estradiol. Options for treating menopausal  symptoms discussed - ERT, SSRI/SNRI, gabapentin.  Will start transdermal estrogen Vivelle Dot 0.05 mg twice weekly.  #24, RF none.  I discussed risks of stroke, DVT, and PE.  FU in 6 weeks for recheck.  Follow up annually and prn.    After visit summary provided.

## 2020-07-22 ENCOUNTER — Encounter: Payer: Self-pay | Admitting: Obstetrics and Gynecology

## 2020-07-22 ENCOUNTER — Other Ambulatory Visit: Payer: Self-pay

## 2020-07-22 ENCOUNTER — Ambulatory Visit (INDEPENDENT_AMBULATORY_CARE_PROVIDER_SITE_OTHER): Payer: BC Managed Care – PPO | Admitting: Obstetrics and Gynecology

## 2020-07-22 VITALS — BP 124/60 | HR 82 | Ht 65.5 in | Wt 241.0 lb

## 2020-07-22 DIAGNOSIS — N951 Menopausal and female climacteric states: Secondary | ICD-10-CM

## 2020-07-22 DIAGNOSIS — Z01419 Encounter for gynecological examination (general) (routine) without abnormal findings: Secondary | ICD-10-CM | POA: Diagnosis not present

## 2020-07-22 MED ORDER — ESTRADIOL 0.05 MG/24HR TD PTTW: 1.0000 | MEDICATED_PATCH | TRANSDERMAL | 0 refills | Status: DC

## 2020-07-22 NOTE — Patient Instructions (Signed)
EXERCISE AND DIET:  We recommended that you start or continue a regular exercise program for good health. Regular exercise means any activity that makes your heart beat faster and makes you sweat.  We recommend exercising at least 30 minutes per day at least 3 days a week, preferably 4 or 5.  We also recommend a diet low in fat and sugar.  Inactivity, poor dietary choices and obesity can cause diabetes, heart attack, stroke, and kidney damage, among others.    ALCOHOL AND SMOKING:  Women should limit their alcohol intake to no more than 7 drinks/beers/glasses of wine (combined, not each!) per week. Moderation of alcohol intake to this level decreases your risk of breast cancer and liver damage. And of course, no recreational drugs are part of a healthy lifestyle.  And absolutely no smoking or even second hand smoke. Most people know smoking can cause heart and lung diseases, but did you know it also contributes to weakening of your bones? Aging of your skin?  Yellowing of your teeth and nails?  CALCIUM AND VITAMIN D:  Adequate intake of calcium and Vitamin D are recommended.  The recommendations for exact amounts of these supplements seem to change often, but generally speaking 600 mg of calcium (either carbonate or citrate) and 800 units of Vitamin D per day seems prudent. Certain women may benefit from higher intake of Vitamin D.  If you are among these women, your doctor will have told you during your visit.    PAP SMEARS:  Pap smears, to check for cervical cancer or precancers,  have traditionally been done yearly, although recent scientific advances have shown that most women can have pap smears less often.  However, every woman still should have a physical exam from her gynecologist every year. It will include a breast check, inspection of the vulva and vagina to check for abnormal growths or skin changes, a visual exam of the cervix, and then an exam to evaluate the size and shape of the uterus and  ovaries.  And after 51 years of age, a rectal exam is indicated to check for rectal cancers. We will also provide age appropriate advice regarding health maintenance, like when you should have certain vaccines, screening for sexually transmitted diseases, bone density testing, colonoscopy, mammograms, etc.   MAMMOGRAMS:  All women over 40 years old should have a yearly mammogram. Many facilities now offer a "3D" mammogram, which may cost around $50 extra out of pocket. If possible,  we recommend you accept the option to have the 3D mammogram performed.  It both reduces the number of women who will be called back for extra views which then turn out to be normal, and it is better than the routine mammogram at detecting truly abnormal areas.    COLONOSCOPY:  Colonoscopy to screen for colon cancer is recommended for all women at age 50.  We know, you hate the idea of the prep.  We agree, BUT, having colon cancer and not knowing it is worse!!  Colon cancer so often starts as a polyp that can be seen and removed at colonscopy, which can quite literally save your life!  And if your first colonoscopy is normal and you have no family history of colon cancer, most women don't have to have it again for 10 years.  Once every ten years, you can do something that may end up saving your life, right?  We will be happy to help you get it scheduled when you are ready.    Be sure to check your insurance coverage so you understand how much it will cost.  It may be covered as a preventative service at no cost, but you should check your particular policy.    Menopause and Hormone Replacement Therapy Menopause is a normal time of life when menstrual periods stop completely and the ovaries stop producing the female hormones estrogen and progesterone. Low levels of these hormones can affect your health and cause symptoms. Hormonereplacement therapy (HRT) can relieve some of those symptoms. HRT is the use of artificial (synthetic)  hormones to replace hormones that your body has stopped producing because youhave reached menopause. Types of HRT  HRT may consist of the synthetic hormones estrogen and progestin, or it may consist of estrogen-only therapy. You and your health care provider will decidewhich form of HRT is best for you. If you choose to be on HRT and you have a uterus, estrogen and progestin are usually prescribed. Estrogen-only therapy is used for women who do not have auterus. Possible options for taking HRT include: Pills. Patches. Gels. Sprays. Vaginal cream. Vaginal rings. Vaginal inserts. The amount of hormones that you take and how long you take them varies according to your health. It is important to: Begin HRT with the lowest possible dosage. Stop HRT as soon as your health care provider tells you to stop. Work with your health care provider so that you feel informed and comfortable with your decisions. Tell a health care provider about: Any allergies you have. Whether you have had blood clots or know of any risk factors you may have for blood clots. Whether you or family members have had cancer, especially cancer of the breasts, ovaries, or uterus. Any surgeries you have had. All medicines you are taking, including vitamins, herbs, eye drops, creams, and over-the-counter medicines. Whether you are pregnant or may be pregnant. Any medical conditions you have. What are the benefits? HRT can reduce the frequency and severity of menopausal symptoms. Benefits of HRT vary according to the kind of symptoms that you have, how severe they are, and your overall health. HRT may help to improve the following symptoms of menopause: Hot flashes and night sweats. These are sudden feelings of heat that spread over the face and body. The skin may turn red, like a blush. Night sweats are hot flashes that happen while you are sleeping or trying to sleep. Bone loss (osteoporosis). The body loses calcium more  quickly after menopause, causing the bones to become weaker. This can increase the risk for bone breaks (fractures). Vaginal dryness. The lining of the vagina can become thin and dry, which can cause pain during sex or cause infection, burning, or itching. Urinary tract infections. Urinary incontinence. This is the inability to control when you urinate. Irritability. Short-term memory problems. What are the risks? Risks of HRT vary depending on your individual health and medical history. Risks of HRT also depend on whether you receive both estrogen and progestin or you receive estrogen only. HRT may increase the risk of: Spotting. This is when a small amount of blood leaks from the vagina unexpectedly. Endometrial cancer. This cancer is in the lining of the uterus (endometrium). Breast cancer. Increased density of breast tissue. This can make it harder to find breast cancer on a breast X-ray (mammogram). Stroke. Heart disease. Blood clots. Gallbladder disease or liver disease. Risks of HRT can increase if you have any of the following conditions: Endometrial cancer. Liver disease. Heart disease. Breast cancer. History of blood clots.  History of stroke. Follow these instructions at home: Pap tests Have Pap tests done as often as told by your health care provider. A Pap test is sometimes called a Pap smear. It is a screening test that is used to check for signs of cancer of the cervix and vagina. A Pap test can also identify the presence of infection or precancerous changes. Pap tests may be done: Every 3 years, starting at age 12. Every 5 years, starting after age 77, in combination with testing for human papillomavirus (HPV). More often or less often depending on other medical conditions you have, your age, and other risk factors. It is up to you to get the results of your Pap test. Ask your health care provider, or the department that is doing the test, when your results will be  ready. General instructions Take over-the-counter and prescription medicines only as told by your health care provider. Do not use any products that contain nicotine or tobacco. These products include cigarettes, chewing tobacco, and vaping devices, such as e-cigarettes. If you need help quitting, ask your health care provider. Get mammograms, pelvic exams, and medical checkups as often as told by your health care provider. Keep all follow-up visits. This is important. Contact a health care provider if you have: Pain or swelling in your legs. Lumps or changes in your breasts or armpits. Pain, burning, or bleeding when you urinate. Unusual vaginal bleeding. Dizziness or headaches. Pain in your abdomen. Get help right away if you have: Shortness of breath. Chest pain. Slurred speech. Weakness or numbness in any part of your arms or legs. These symptoms may represent a serious problem that is an emergency. Do not wait to see if the symptoms will go away. Get medical help right away. Call your local emergency services (911 in the [place] Do not drive yourself to the hospital. Summary Menopause is a normal time of life when menstrual periods stop completely and the ovaries stop producing the female hormones estrogen and progesterone. HRT can reduce the frequency and severity of menopausal symptoms. Risks of HRT vary depending on your individual health and medical history. This information is not intended to replace advice given to you by your health care provider. Make sure you discuss any questions you have with your healthcare provider. Document Revised: 07/22/2019 Document Reviewed: 07/22/2019 Elsevier Patient Education  [place].

## 2020-07-23 LAB — FOLLICLE STIMULATING HORMONE: FSH: 14.7 m[IU]/mL

## 2020-07-23 LAB — ESTRADIOL: Estradiol: 68 pg/mL

## 2020-08-21 ENCOUNTER — Ambulatory Visit: Payer: BC Managed Care – PPO | Admitting: Sports Medicine

## 2020-08-21 ENCOUNTER — Other Ambulatory Visit: Payer: Self-pay

## 2020-08-21 ENCOUNTER — Encounter: Payer: Self-pay | Admitting: Sports Medicine

## 2020-08-21 DIAGNOSIS — M79672 Pain in left foot: Secondary | ICD-10-CM

## 2020-08-21 DIAGNOSIS — M779 Enthesopathy, unspecified: Secondary | ICD-10-CM | POA: Diagnosis not present

## 2020-08-21 DIAGNOSIS — M2142 Flat foot [pes planus] (acquired), left foot: Secondary | ICD-10-CM

## 2020-08-21 DIAGNOSIS — M2141 Flat foot [pes planus] (acquired), right foot: Secondary | ICD-10-CM

## 2020-08-21 DIAGNOSIS — M792 Neuralgia and neuritis, unspecified: Secondary | ICD-10-CM | POA: Diagnosis not present

## 2020-08-21 DIAGNOSIS — M79671 Pain in right foot: Secondary | ICD-10-CM | POA: Diagnosis not present

## 2020-08-21 MED ORDER — TRIAMCINOLONE ACETONIDE 10 MG/ML IJ SUSP
10.0000 mg | Freq: Once | INTRAMUSCULAR | Status: AC
  Administered 2020-08-21: 10 mg

## 2020-08-21 NOTE — Progress Notes (Signed)
Subjective: Krista [name] is a 50 y.o. female patient who returns office for follow-up evaluation of foot pain.  Patient reports that her second toe starting to bother her again with a burning sensation over the toe and pain with activity patient states that she is also getting a pain on the lateral side of her right foot that has started over the last several weeks slowly getting worse.  Patient denies any other pedal complaints at this time.  Fasting blood sugar not recorded.   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     baclofen (LIORESAL) 10 MG tablet Take 10 mg by mouth daily as needed for muscle spasms.      Dulaglutide (TRULICITY) 1.5 0000000 SOPN Inject 1.5 mg into the skin every Saturday.     DULoxetine (CYMBALTA) 60 MG capsule Take 60 mg by mouth daily.      estradiol (VIVELLE-DOT) 0.05 MG/24HR patch Place 1 patch (0.05 mg total) onto the skin 2 (two) times a week. 24 patch 0   fexofenadine (ALLEGRA) 180 MG tablet Take 180 mg by mouth daily.      fluticasone (FLONASE) 50 MCG/ACT nasal spray Place 2 sprays into both nostrils daily.      furosemide (LASIX) 20 MG tablet Take 20 mg by mouth 2 (two) times daily as needed.     losartan-hydrochlorothiazide (HYZAAR) 100-25 MG tablet Take 1 tablet by mouth daily.      metFORMIN (GLUCOPHAGE) 500 MG tablet Take 500 mg by mouth 2 (two) times daily.     montelukast (SINGULAIR) 10 MG tablet Take 10 mg by mouth at bedtime.      Multiple Vitamin (MULTIVITAMIN) capsule Take 1 capsule by mouth daily.     naproxen sodium (ALEVE) 220 MG tablet Take  220 mg by mouth daily as needed (pain).     ONETOUCH VERIO test strip 1 each 2 (two) times daily.     pantoprazole (PROTONIX) 40 MG tablet Take 40 mg by mouth at bedtime.      potassium chloride (KLOR-CON) 10 MEQ tablet Take 10 mEq by mouth daily.     propranolol ER (INDERAL LA) 160 MG SR capsule Take 160 mg by mouth daily.     rosuvastatin (CRESTOR) 5 MG tablet Take 5 mg by mouth at bedtime.      SYMBICORT 160-4.5 MCG/ACT inhaler Inhale 2 puffs into the lungs 2 (two) times daily as needed (shortness of breath).      Vitamin D, Ergocalciferol, (DRISDOL) 50000 UNITS CAPS capsule Take 50,000 Units by mouth every Thursday.      Current Facility-Administered Medications on File Prior to Visit  Medication Dose Route Frequency Provider Last Rate Last Admin   triamcinolone acetonide (KENALOG) 10 MG/ML injection 10 mg  10 mg Other Once Landis Martins, DPM        Allergies  Allergen Reactions   Ace Inhibitors Cough   Elemental Sulfur     Itching rash   Penicillins Itching, Swelling and Rash    Has patient had a PCN reaction causing immediate rash, facial/tongue/throat swelling, SOB or lightheadedness  with hypotension: Yes Has patient had a PCN reaction causing severe rash involving mucus membranes or skin necrosis: Yes Has patient had a PCN reaction that required hospitalization No Has patient had a PCN reaction occurring within the last 10 years: No If all of the above answers are "NO", then may proceed with Cephalosporin use.     Objective:  General: Alert and oriented x3 in no acute distress  Dermatology: No open lesions bilateral lower extremities, no webspace macerations, no ecchymosis bilateral, all nails x 10 are well manicured but with thickening that has much improved bilateral second toenails.  Vascular: Dorsalis Pedis and Posterior Tibial pedal pulses palpable, Capillary Fill Time 3 seconds,(+) pedal hair growth bilateral, trace edema bilateral lower extremities, Temperature  gradient within normal limits.  Neurology: Johney Maine sensation intact via light touch bilateral.  Musculoskeletal: There is mild to moderate tenderness with palpation at left second metatarsophalangeal joint and to the fifth metatarsal base at the peroneus brevis insertion on the right.  Long second toe bilateral left greater than right. Pes planus foot type.  Strength appears to be in normal limits.  There is mild guarding due to pain with range of motion.  Assessment and Plan: Problem List Items Addressed This Visit   None Visit Diagnoses     Capsulitis    -  Primary   Neuritis       Left foot pain       Tendinitis       Right foot pain       Pes planus of both feet          -Complete examination performed -Discussed treatment options for recurrent capsulitis and second toe joint pain on the left and pain to the lateral side of the right foot likely secondary to compensation -Patient elects at this time to try another steroid shot since previously it helped  -After oral consent and aseptic prep, injected a mixture containing 1 ml of 2%  plain lidocaine, 1 ml 0.5% plain marcaine, 0.5 ml of kenalog 10 and 0.5 ml of dexamethasone phosphate into left second metatarsophalangeal joint and right lateral foot at the peroneus brevis insertion without complication. Post-injection care discussed with patient.  -Recommend good supportive shoes daily for foot type -Advised icing daily until symptoms resolve -Patient to return to office if symptoms fail to continue to improve or if symptoms worsen Landis Martins, DPM

## 2020-08-25 NOTE — Progress Notes (Signed)
GYNECOLOGY  VISIT   HPI: 50 y.o.   Married  [place] American  female   [phone] with Patient's last menstrual period was 07/02/2015 (approximate).   here for medication follow up.   On Vivelle Dot. Hot flashes and mood swings improved.  Satisfied with her dosage.  Still does sweat.  States she does have diabetes.   Patient noting of vaginal itching off & on. Vagisil wipes helped the itching.   Noticing vaginal dryness.   Denies dysuria.  Does have discomfort with voiding only if the vagina is dry.   GYNECOLOGIC HISTORY: Patient's last menstrual period was 07/02/2015 (approximate). Contraception: Hyst Menopausal hormone therapy:  Vivelle Dot 0.'05mg'$  Last mammogram:   10-21-19 3D/Neg/BiRads1 Last pap smear: 01-09-15 ASCUS HPV HR neg, 12-12-13 Neg:Neg Hr HPV, 09-13-12 Neg:Neg HR HPV,  Final pathology on cervix at time of hysterectomy was benign.        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   Morbid obesity ([place]) 11/22/2012   OSA on CPAP 11/22/2012   HTN (hypertension) 11/22/2012   DOE (dyspnea on exertion) 11/22/2012   Abnormal EKG 11/22/2012    Past Medical History:  Diagnosis Date   Abnormal Pap smear of cervix 2006   Leep, colpo   Anxiety    Arthritis    DM type 2 (diabetes mellitus, type 2) (HCC)    type 2   Fibroid    GERD (gastroesophageal reflux disease)    History of palpitations yrs ago   PVCs - wore holter monitor, negative results   Hypertension    OSA (obstructive sleep apnea) 06/2010   set on 10   Restless leg syndrome    patient denies this dx   Seasonal allergies    STD (sexually transmitted disease)    hx HPV   SVD (spontaneous vaginal delivery)    x 1   Vulvar cyst      Past Surgical History:  Procedure Laterality Date   CERVICAL BIOPSY  W/ LOOP ELECTRODE EXCISION  2006   colonscopy  09/2019   polyps return in 5 years   COLPOSCOPY  2006   CYSTOSCOPY N/A 07/21/2015   Procedure: CYSTOSCOPY;  Surgeon: [name], MD;  Location: [facility];  Service: Gynecology;  Laterality: N/A;   ESOPHAGOGASTRODUODENOSCOPY (EGD) WITH PROPOFOL N/A 12/12/2019   Procedure: ESOPHAGOGASTRODUODENOSCOPY (EGD) WITH PROPOFOL;  Surgeon: Milus Banister, MD;  Location: [facility];  Service: Endoscopy;  Laterality: N/A;   EUS N/A 12/12/2019   Procedure: UPPER ENDOSCOPIC ULTRASOUND (EUS) LINEAR;  Surgeon: Milus Banister, MD;  Location: [facility];  Service: Endoscopy;  Laterality: N/A;   FINE NEEDLE ASPIRATION N/A 12/12/2019   Procedure: FINE NEEDLE ASPIRATION (FNA) LINEAR;  Surgeon: Milus Banister, MD;  Location: [facility];  Service: Endoscopy;  Laterality: N/A;   LESION REMOVAL Left 11/11/2019   Procedure: EXCISION OF LEFT VULVAR CYST;  Surgeon: [name], MD;  Location: [facility];  Service: Gynecology;  Laterality: Left;   ROBOTIC ASSISTED TOTAL HYSTERECTOMY WITH SALPINGECTOMY Bilateral 07/21/2015   Procedure: ROBOTIC ASSISTED TOTAL HYSTERECTOMY WITH SALPINGECTOMY;  Surgeon: [name], MD;  Location: [facility];  Service: Gynecology;  Laterality: Bilateral;   UPPER GI ENDOSCOPY  09/2019   WISDOM TOOTH EXTRACTION  1990's    Current Outpatient Medications  Medication Sig Dispense Refill   amLODipine (NORVASC) 10 MG tablet Take 10 mg by mouth daily.     baclofen (LIORESAL) 10 MG tablet Take 10 mg by mouth daily as needed for muscle spasms.      Dulaglutide (TRULICITY) 1.5 0000000 SOPN Inject 1.5 mg into the skin every Saturday.     DULoxetine (CYMBALTA) 60 MG capsule Take 60 mg by mouth daily.      [START ON 09/03/2020] Estradiol (VAGIFEM) 10 MCG TABS vaginal tablet Place 1 tablet (10 mcg total) vaginally 2 (two)  times a week. Use every night before bed for two weeks when you first begin this medicine, then after the first two weeks, begin using it twice a week. 42 tablet 3   fexofenadine (ALLEGRA) 180 MG tablet Take 180 mg by mouth daily.      fluticasone (FLONASE) 50 MCG/ACT nasal spray Place 2 sprays into both nostrils daily.      furosemide (LASIX) 20 MG tablet Take 20 mg by mouth 2 (two) times daily as needed.     losartan-hydrochlorothiazide (HYZAAR) 100-25 MG tablet Take 1 tablet by mouth daily.      metFORMIN (GLUCOPHAGE) 500 MG tablet Take 500 mg by mouth 2 (two) times daily.     montelukast (SINGULAIR) 10 MG tablet Take 10 mg by mouth at bedtime.      Multiple Vitamin (MULTIVITAMIN) capsule Take 1 capsule by mouth daily.     ONETOUCH VERIO test strip 1 each 2 (two) times daily.     pantoprazole (PROTONIX) 40 MG tablet Take 40 mg by mouth at bedtime.      potassium chloride (KLOR-CON) 10 MEQ tablet Take 10 mEq by mouth daily.     propranolol ER (INDERAL LA) 160 MG SR capsule Take 160 mg by mouth daily.     rosuvastatin (CRESTOR) 5 MG tablet Take 5 mg by mouth at bedtime.      SYMBICORT 160-4.5 MCG/ACT inhaler Inhale 2 puffs into the lungs 2 (two) times daily as needed (shortness of breath).      traMADol (ULTRAM) 50 MG tablet Take 50 mg by mouth daily as needed.     Vitamin D, Ergocalciferol, (DRISDOL) 50000 UNITS CAPS capsule Take 50,000 Units by mouth every Thursday.      [START ON 09/03/2020] estradiol (VIVELLE-DOT) 0.05 MG/24HR patch Place 1 patch (0.05 mg total) onto the skin 2 (two) times a week. 24 patch 2   Current Facility-Administered Medications  Medication Dose Route Frequency Provider Last Rate Last Admin   triamcinolone acetonide (KENALOG) 10 MG/ML injection 10 mg  10 mg Other Once Landis Martins, DPM         ALLERGIES: Ace inhibitors, Elemental sulfur, and Penicillins  Family History  Problem Relation Age of Onset   Hypertension Mother    Diabetes Father    Hypertension  Father    Ovarian cancer Maternal Grandmother    Hypertension Maternal Grandmother    Stroke Maternal Grandfather    Diabetes Paternal Grandmother    Hypertension Paternal Grandmother    Alzheimer's disease Paternal Grandfather    Cancer Maternal  Aunt        dec bladder ca   Breast cancer Paternal Aunt    Breast cancer Cousin     Social History   Socioeconomic History   Marital status: Married    Spouse name: Not on file   Number of children: 1   Years of education: 14   Highest education level: Not on file  Occupational History   Occupation: Environmental consultant - [employer]    Employer: [employer]  Tobacco Use   Smoking status: Never   Smokeless tobacco: Never  Scientific laboratory technician Use: Never used  Substance and Sexual Activity   Alcohol use: Yes    Comment: 1 drink/month   Drug use: No   Sexual activity: Yes    Partners: Male    Birth control/protection: Surgical    Comment: R-TLH/Bil.salingectomy  Other Topics Concern   Not on file  Social History Narrative   Not on file   Social Determinants of Health   Financial Resource Strain: Not on file  Food Insecurity: Not on file  Transportation Needs: Not on file  Physical Activity: Not on file  Stress: Not on file  Social Connections: Not on file  Intimate Partner Violence: Not on file    Review of Systems  Genitourinary:        Vaginal itching  All other systems reviewed and are negative.  PHYSICAL EXAMINATION:    BP 108/60   Ht 5' 5.5" (1.664 m)   Wt 241 lb (109.3 kg)   LMP 07/02/2015 (Approximate)   BMI 39.49 kg/m     General appearance: alert, cooperative and appears stated age  Pelvic: External genitalia:  no lesions              Urethra:  normal appearing urethra with no masses, tenderness or lesions              Bartholins and Skenes: normal                 Vagina: normal appearing vagina with normal color and discharge, no lesions              Cervix: Absent                Bimanual Exam:   Uterus:  Absent.               Adnexa: no mass, fullness, tenderness            Chaperone was present for exam:  Estill Bamberg, CMA  ASSESSMENT  Menopausal symptoms.  ERT.  Medication monitoring.  Vaginitis.   PLAN  Continue Vivelle Dot 0.05 mg twice weekly.  We discussed local vaginal estrogen treatments.   Start Vagifem.  Instructed in use. Vaginitis swab.    An After Visit Summary was printed and given to the patient.  21 min total time was spent for this patient encounter, including preparation, face-to-face counseling with the patient, coordination of care, and documentation of the encounter.

## 2020-09-01 ENCOUNTER — Ambulatory Visit: Payer: BC Managed Care – PPO | Admitting: Obstetrics and Gynecology

## 2020-09-01 ENCOUNTER — Other Ambulatory Visit: Payer: Self-pay

## 2020-09-01 ENCOUNTER — Encounter: Payer: Self-pay | Admitting: Obstetrics and Gynecology

## 2020-09-01 ENCOUNTER — Other Ambulatory Visit (HOSPITAL_COMMUNITY)
Admission: RE | Admit: 2020-09-01 | Discharge: 2020-09-01 | Disposition: A | Discharge status: Home / Self Care | Payer: BC Managed Care – PPO | Source: Ambulatory Visit | Attending: Obstetrics and Gynecology | Admitting: Obstetrics and Gynecology

## 2020-09-01 VITALS — BP 108/60 | Ht 65.5 in | Wt 241.0 lb

## 2020-09-01 DIAGNOSIS — Z79899 Other long term (current) drug therapy: Secondary | ICD-10-CM

## 2020-09-01 DIAGNOSIS — N760 Acute vaginitis: Secondary | ICD-10-CM

## 2020-09-01 DIAGNOSIS — Z5181 Encounter for therapeutic drug level monitoring: Secondary | ICD-10-CM

## 2020-09-01 DIAGNOSIS — N951 Menopausal and female climacteric states: Secondary | ICD-10-CM | POA: Diagnosis not present

## 2020-09-01 MED ORDER — ESTRADIOL 0.05 MG/24HR TD PTTW: 1.0000 | MEDICATED_PATCH | TRANSDERMAL | 2 refills | Status: DC

## 2020-09-01 MED ORDER — ESTRADIOL 10 MCG VA TABS: 1.0000 | ORAL_TABLET | VAGINAL | 3 refills | Status: DC

## 2020-09-03 LAB — CERVICOVAGINAL ANCILLARY ONLY
Bacterial Vaginitis (gardnerella): NEGATIVE
Candida Glabrata: NEGATIVE
Candida Vaginitis: POSITIVE — AB
Comment: NEGATIVE
Comment: NEGATIVE
Comment: NEGATIVE
Comment: NEGATIVE
Trichomonas: NEGATIVE

## 2020-09-04 ENCOUNTER — Other Ambulatory Visit: Payer: Self-pay | Admitting: *Deleted

## 2020-09-04 MED ORDER — FLUCONAZOLE 150 MG PO TABS
150.0000 mg | ORAL_TABLET | Freq: Every day | ORAL | 0 refills | Status: DC
Start: 2020-09-04 — End: 2021-03-19

## 2020-09-25 ENCOUNTER — Other Ambulatory Visit: Payer: Self-pay

## 2020-09-25 DIAGNOSIS — A64 Unspecified sexually transmitted disease: Secondary | ICD-10-CM | POA: Insufficient documentation

## 2020-09-25 DIAGNOSIS — Z87898 Personal history of other specified conditions: Secondary | ICD-10-CM | POA: Insufficient documentation

## 2020-09-25 DIAGNOSIS — M1990 Unspecified osteoarthritis, unspecified site: Secondary | ICD-10-CM | POA: Insufficient documentation

## 2020-09-25 DIAGNOSIS — N907 Vulvar cyst: Secondary | ICD-10-CM | POA: Insufficient documentation

## 2020-09-25 DIAGNOSIS — K219 Gastro-esophageal reflux disease without esophagitis: Secondary | ICD-10-CM | POA: Insufficient documentation

## 2020-09-25 DIAGNOSIS — I10 Essential (primary) hypertension: Secondary | ICD-10-CM | POA: Insufficient documentation

## 2020-09-25 DIAGNOSIS — J302 Other seasonal allergic rhinitis: Secondary | ICD-10-CM | POA: Insufficient documentation

## 2020-09-25 DIAGNOSIS — E119 Type 2 diabetes mellitus without complications: Secondary | ICD-10-CM | POA: Insufficient documentation

## 2020-09-25 DIAGNOSIS — F419 Anxiety disorder, unspecified: Secondary | ICD-10-CM | POA: Insufficient documentation

## 2020-09-25 DIAGNOSIS — D219 Benign neoplasm of connective and other soft tissue, unspecified: Secondary | ICD-10-CM | POA: Insufficient documentation

## 2020-09-28 ENCOUNTER — Other Ambulatory Visit: Payer: Self-pay

## 2020-09-28 DIAGNOSIS — R002 Palpitations: Secondary | ICD-10-CM | POA: Insufficient documentation

## 2020-09-28 DIAGNOSIS — R079 Chest pain, unspecified: Secondary | ICD-10-CM | POA: Insufficient documentation

## 2020-09-29 ENCOUNTER — Ambulatory Visit: Payer: BC Managed Care – PPO | Admitting: Cardiology

## 2020-09-29 ENCOUNTER — Encounter: Payer: Self-pay | Admitting: Cardiology

## 2020-09-29 ENCOUNTER — Other Ambulatory Visit: Payer: Self-pay

## 2020-09-29 VITALS — BP 104/68 | HR 90 | Ht 65.0 in | Wt 239.4 lb

## 2020-09-29 DIAGNOSIS — I10 Essential (primary) hypertension: Secondary | ICD-10-CM

## 2020-09-29 DIAGNOSIS — R0600 Dyspnea, unspecified: Secondary | ICD-10-CM

## 2020-09-29 DIAGNOSIS — E088 Diabetes mellitus due to underlying condition with unspecified complications: Secondary | ICD-10-CM | POA: Insufficient documentation

## 2020-09-29 DIAGNOSIS — G4733 Obstructive sleep apnea (adult) (pediatric): Secondary | ICD-10-CM

## 2020-09-29 DIAGNOSIS — R072 Precordial pain: Secondary | ICD-10-CM

## 2020-09-29 DIAGNOSIS — R079 Chest pain, unspecified: Secondary | ICD-10-CM

## 2020-09-29 DIAGNOSIS — R0609 Other forms of dyspnea: Secondary | ICD-10-CM

## 2020-09-29 DIAGNOSIS — R9431 Abnormal electrocardiogram [ECG] [EKG]: Secondary | ICD-10-CM

## 2020-09-29 DIAGNOSIS — Z9989 Dependence on other enabling machines and devices: Secondary | ICD-10-CM

## 2020-09-29 HISTORY — DX: Diabetes mellitus due to underlying condition with unspecified complications: E08.8

## 2020-09-29 NOTE — Progress Notes (Signed)
Cardiology Office Note:    Date:  09/29/2020   ID:  Krista [name] [place], DOB [birth year], MRN [id number]  PCP:  [name], MD  Cardiologist:  Jenean Lindau, MD   Referring MD: [name], MD    ASSESSMENT:    1. Primary hypertension   2. Abnormal EKG   3. OSA on CPAP   4. Morbid obesity ([place])   5. DOE (dyspnea on exertion)   6. Diabetes mellitus due to underlying condition with unspecified complications ([place])   7. Chest pain, unspecified type    PLAN:    In order of problems listed above:  Primary prevention stressed with the patient.  Importance of compliance with diet medication stressed and she vocalized understanding. Chest pain: Atypical for coronary etiology but patient has multiple risk factors for coronary artery disease so we will do an exercise stress Cardiolite and she is agreeable Essential hypertension: Blood pressure stable and diet was emphasized. Mixed dyslipidemia, diabetes mellitus and obesity: I did extensive discussion with the patient about lifestyle modification.  Weight reduction was urged risks of obesity explained her hemoglobin A1c is elevated and had a long chat with her about this and she promises to do better. Sleep apnea: Sleep practices were discussed. Cardiac murmur: Echocardiogram will be done to assess murmur heard on auscultation. Patient will be seen in follow-up appointment in 6 months or earlier if the patient has any concerns.    Medication Adjustments/Labs and Tests Ordered: Current medicines are reviewed at length with the patient today.  Concerns regarding medicines are outlined above.  No orders of the defined types were placed in this encounter.  No orders of the defined types were placed in this encounter.    History of Present Illness:    Krista [name] is a 50 y.o. female who is being seen today for the evaluation of chest pain at the request of [name], MD. patient is a pleasant 50 year old female.  She has a  past medical history of essential hypertension, dyslipidemia, diabetes mellitus and morbid obesity.  She leads a sedentary lifestyle.  She is a retired Pharmacist, hospital.  She mentions to me that she has some dyspnea on exertion.  More significantly she has chest discomfort.  Dyspnea on [place], on exertion.  She leads a sedentary lifestyle and does not exercise much.  Sexual activity does not bring around the symptoms.  At the time of my evaluation, the patient is alert awake oriented and in no distress.  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   GERD (gastroesophageal reflux disease)    History of palpitations yrs ago   PVCs - wore holter monitor, negative results   HTN (hypertension) 11/22/2012   Hypertension    Morbid obesity ([place]) 11/22/2012   OSA on CPAP 11/22/2012   Palpitations    Restless leg syndrome    patient denies this dx   Seasonal allergies    Status post laparoscopic hysterectomy 07/21/2015   STD (sexually transmitted disease)    hx HPV   SVD (spontaneous vaginal delivery)    x 1  Vulvar cyst     Past Surgical History:  Procedure Laterality Date   CERVICAL BIOPSY  W/ LOOP ELECTRODE EXCISION  2006   colonscopy  09/2019   polyps return in 5 years   COLPOSCOPY  2006   CYSTOSCOPY N/A 07/21/2015   Procedure: CYSTOSCOPY;  Surgeon: [name], MD;  Location: [facility];  Service: Gynecology;  Laterality: N/A;   ESOPHAGOGASTRODUODENOSCOPY (EGD) WITH PROPOFOL N/A 12/12/2019   Procedure: ESOPHAGOGASTRODUODENOSCOPY (EGD) WITH PROPOFOL;  Surgeon: Milus Banister, MD;  Location: [facility];  Service: Endoscopy;  Laterality: N/A;   EUS N/A 12/12/2019   Procedure:  UPPER ENDOSCOPIC ULTRASOUND (EUS) LINEAR;  Surgeon: Milus Banister, MD;  Location: [facility];  Service: Endoscopy;  Laterality: N/A;   FINE NEEDLE ASPIRATION N/A 12/12/2019   Procedure: FINE NEEDLE ASPIRATION (FNA) LINEAR;  Surgeon: Milus Banister, MD;  Location: [facility];  Service: Endoscopy;  Laterality: N/A;   LESION REMOVAL Left 11/11/2019   Procedure: EXCISION OF LEFT VULVAR CYST;  Surgeon: [name], MD;  Location: [facility];  Service: Gynecology;  Laterality: Left;   ROBOTIC ASSISTED TOTAL HYSTERECTOMY WITH SALPINGECTOMY Bilateral 07/21/2015   Procedure: ROBOTIC ASSISTED TOTAL HYSTERECTOMY WITH SALPINGECTOMY;  Surgeon: [name], MD;  Location: [facility];  Service: Gynecology;  Laterality: Bilateral;   UPPER GI ENDOSCOPY  09/2019   WISDOM TOOTH EXTRACTION  1990's    Current Medications: Current Meds  Medication Sig   ALPRAZolam (XANAX) 0.25 MG tablet Take 0.25 mg by mouth 2 (two) times daily as needed for anxiety.   amLODipine (NORVASC) 10 MG tablet Take 10 mg by mouth daily.   baclofen (LIORESAL) 10 MG tablet Take 10 mg by mouth daily as needed for muscle spasms.    budesonide-formoterol (SYMBICORT) 160-4.5 MCG/ACT inhaler Inhale 2 puffs into the lungs 2 (two) times daily as needed for shortness of breath.   Dulaglutide (TRULICITY) 1.5 0000000 SOPN Inject 1.5 mg into the skin every Saturday.   DULoxetine (CYMBALTA) 60 MG capsule Take 60 mg by mouth daily.    Estradiol (VAGIFEM) 10 MCG TABS vaginal tablet Place 1 tablet (10 mcg total) vaginally 2 (two) times a week. Use every night before bed for two weeks when you first begin this medicine, then after the first two weeks, begin using it twice a week.   estradiol (VIVELLE-DOT) 0.05 MG/24HR patch Place 1 patch (0.05 mg total) onto the skin 2 (two) times a week.   fexofenadine (ALLEGRA) 180 MG tablet Take 180 mg by mouth daily.    fluconazole (DIFLUCAN) 150 MG tablet Take 1 tablet  (150 mg total) by mouth daily. May repeat in 72 hours if needed.   fluticasone (FLONASE) 50 MCG/ACT nasal spray Place 2 sprays into both nostrils daily.    furosemide (LASIX) 20 MG tablet Take 20 mg by mouth 2 (two) times daily as needed for edema.   linagliptin (TRADJENTA) 5 MG TABS tablet Take 5 mg by mouth daily.   losartan-hydrochlorothiazide (HYZAAR) 100-25 MG tablet Take 1 tablet by mouth daily.    meloxicam (MOBIC) 15 MG tablet Take 15 mg by mouth daily.   metFORMIN (GLUCOPHAGE) 500 MG tablet Take 500 mg by mouth 2 (two) times daily.   montelukast (SINGULAIR) 10 MG tablet Take 10 mg by mouth at bedtime.    Multiple Vitamin (MULTIVITAMIN) capsule Take 1 capsule by mouth daily.   nitroGLYCERIN (NITROSTAT) 0.4 MG SL tablet Place 0.4 mg under the tongue as  needed for chest pain.   oxybutynin (DITROPAN-XL) 10 MG 24 hr tablet Take 10 mg by mouth at bedtime.   pantoprazole (PROTONIX) 40 MG tablet Take 40 mg by mouth at bedtime.    potassium chloride (KLOR-CON) 10 MEQ tablet Take 10 mEq by mouth daily.   propranolol ER (INDERAL LA) 160 MG SR capsule Take 160 mg by mouth daily.   rosuvastatin (CRESTOR) 5 MG tablet Take 5 mg by mouth at bedtime.    traMADol (ULTRAM) 50 MG tablet Take 50 mg by mouth daily as needed for pain.   Vitamin D, Ergocalciferol, (DRISDOL) 50000 UNITS CAPS capsule Take 50,000 Units by mouth every Thursday.      Allergies:   Ace inhibitors, Elemental sulfur, and Penicillins   Social History   Socioeconomic History   Marital status: Married    Spouse name: Not on file   Number of children: 1   Years of education: 14   Highest education level: Not on file  Occupational History   Occupation: Environmental consultant - [employer]    Employer: [employer]  Tobacco Use   Smoking status: Never   Smokeless tobacco: Never  Scientific laboratory technician Use: Never used  Substance and Sexual Activity   Alcohol use: Yes    Comment: 1 drink/month   Drug use: No   Sexual activity: Yes     Partners: Male    Birth control/protection: Surgical    Comment: R-TLH/Bil.salingectomy  Other Topics Concern   Not on file  Social History Narrative   Not on file   Social Determinants of Health   Financial Resource Strain: Not on file  Food Insecurity: Not on file  Transportation Needs: Not on file  Physical Activity: Not on file  Stress: Not on file  Social Connections: Not on file     Family History: The patient's family history includes Alzheimer's disease in her paternal grandfather; Breast cancer in her cousin and paternal aunt; Cancer in her maternal aunt; Diabetes in her father and paternal grandmother; Hypertension in her father, maternal grandmother, mother, and paternal grandmother; Ovarian cancer in her maternal grandmother; Stroke in her maternal grandfather.  ROS:   Please see the history of present illness.    All other systems reviewed and are negative.  EKGs/Labs/Other Studies Reviewed:    The following studies were reviewed today: EKG reveals sinus rhythm nonspecific ST-T changes   Recent Labs: 11/07/2019: BUN 11; Creatinine, Ser 0.65; Hemoglobin 10.9; Platelets 301; Potassium 3.3; Sodium 142  Recent Lipid Panel No results found for: CHOL, TRIG, HDL, CHOLHDL, VLDL, LDLCALC, LDLDIRECT  Physical Exam:    VS:  BP 104/68   Pulse 90   Ht '5\' 5"'$  (1.651 m)   Wt 239 lb 6.4 oz (108.6 kg)   LMP 07/02/2015 (Approximate)   SpO2 96%   BMI 39.84 kg/m     Wt Readings from Last 3 Encounters:  09/29/20 239 lb 6.4 oz (108.6 kg)  09/01/20 241 lb (109.3 kg)  07/22/20 241 lb (109.3 kg)     GEN: Patient is in no acute distress HEENT: Normal NECK: No JVD; No carotid bruits LYMPHATICS: No lymphadenopathy CARDIAC: S1 S2 regular, 2/6 systolic murmur at the apex. RESPIRATORY:  Clear to auscultation without rales, wheezing or rhonchi  ABDOMEN: Soft, non-tender, non-distended MUSCULOSKELETAL:  No edema; No deformity  SKIN: Warm and dry NEUROLOGIC:  Alert and  oriented x 3 PSYCHIATRIC:  Normal affect    Signed, Jenean Lindau, MD  09/29/2020 3:09 PM  [facility]

## 2020-09-29 NOTE — Patient Instructions (Signed)
Medication Instructions:  No medication changes. *If you need a refill on your cardiac medications before your next appointment, please call your pharmacy*   Lab Work: None ordered If you have labs (blood work) drawn today and your tests are completely normal, you will receive your results only by: [facility] (if you have MyChart) OR A paper copy in the mail If you have any lab test that is abnormal or we need to change your treatment, we will call you to review the results.   Testing/Procedures: Your physician has requested that you have a lexiscan myoview. For further information please visit [email or web address]. Please follow instruction sheet, as given.  The test will done over 2 days and take approximately 3 to 4 hours to complete; you may bring reading material.  If someone comes with you to your appointment, they will need to remain in the main lobby due to limited space in the testing area. **If you are pregnant or breastfeeding, please notify the nuclear lab prior to your appointment**  How to prepare for your Myocardial Perfusion Test: Do not eat or drink 3 hours prior to your test, except you may have water. Do not consume products containing caffeine (regular or decaffeinated) 12 hours prior to your test. (ex: coffee, chocolate, sodas, tea). Do bring a list of your current medications with you.  If not listed below, you may take your medications as normal. Do not take propranolol (inderal, Innopran) for 24 hours prior to the test.  Bring the medication to your appointment as you may be required to take it once the test is complete. Do wear comfortable clothes (no dresses or overalls) and walking shoes, tennis shoes preferred (No heels or open toe shoes are allowed). Do NOT wear cologne, perfume, aftershave, or lotions (deodorant is allowed). If these instructions are not followed, your test will have to be rescheduled.  Your physician has requested that you have an  echocardiogram. Echocardiography is a painless test that uses sound waves to create images of your heart. It provides your doctor with information about the size and shape of your heart and how well your heart's chambers and valves are working. This procedure takes approximately one hour. There are no restrictions for this procedure.   Follow-Up: At [facility], you and your health needs are our priority.  As part of our continuing mission to provide you with exceptional heart care, we have created designated Provider Care Teams.  These Care Teams include your primary Cardiologist (physician) and Advanced Practice Providers (APPs -  Physician Assistants and Nurse Practitioners) who all work together to provide you with the care you need, when you need it.  We recommend signing up for the patient portal called "MyChart".  Sign up information is provided on this After Visit Summary.  MyChart is used to connect with patients for Virtual Visits (Telemedicine).  Patients are able to view lab/test results, encounter notes, upcoming appointments, etc.  Non-urgent messages can be sent to your provider as well.   To learn more about what you can do with MyChart, go to [email or web address].    Your next appointment:   6 month(s)  The format for your next appointment:   In Person  Provider:   Jyl Heinz, MD   Other Instructions Cardiac Nuclear Scan A cardiac nuclear scan is a test that is done to check the flow of blood to your heart. It is done when you are resting and when you are exercising. The test looks for problems  such as: Not enough blood reaching a portion of the heart. The heart muscle not working as it should. You may need this test if: You have heart disease. You have had lab results that are not normal. You have had heart surgery or a balloon procedure to open up blocked arteries (angioplasty). You have chest pain. You have shortness of breath. In this test, a special dye  (tracer) is put into your bloodstream. The tracer will travel to your heart. A camera will then take pictures of your heart to see how the tracer moves through your heart. This test is usually done at a hospital and takes 2-4 hours. Tell a doctor about: Any allergies you have. All medicines you are taking, including vitamins, herbs, eye drops, creams, and over-the-counter medicines. Any problems you or family members have had with anesthetic medicines. Any blood disorders you have. Any surgeries you have had. Any medical conditions you have. Whether you are pregnant or may be pregnant. What are the risks? Generally, this is a safe test. However, problems may occur, such as: Serious chest pain and heart attack. This is only a risk if the stress portion of the test is done. Rapid heartbeat. A feeling of warmth in your chest. This feeling usually does not last long. Allergic reaction to the tracer. What happens before the test? Ask your doctor about changing or stopping your normal medicines. This is important. Follow instructions from your doctor about what you cannot eat or drink. Remove your jewelry on the day of the test. What happens during the test? An IV tube will be inserted into one of your veins. Your doctor will give you a small amount of tracer through the IV tube. You will wait for 20-40 minutes while the tracer moves through your bloodstream. Your heart will be monitored with an electrocardiogram (ECG). You will lie down on an exam table. Pictures of your heart will be taken for about 15-20 minutes. You may also have a stress test. For this test, one of these things may be done: You will be asked to exercise on a treadmill or a stationary bike. You will be given medicines that will make your heart work harder. This is done if you are unable to exercise. When blood flow to your heart has peaked, a tracer will again be given through the IV tube. After 20-40 minutes, you will  get back on the exam table. More pictures will be taken of your heart. Depending on the tracer that is used, more pictures may need to be taken 3-4 hours later. Your IV tube will be removed when the test is over. The test may vary among doctors and hospitals. What happens after the test? Ask your doctor: Whether you can return to your normal schedule, including diet, activities, and medicines. Whether you should drink more fluids. This will help to remove the tracer from your body. Drink enough fluid to keep your pee (urine) pale yellow. Ask your doctor, or the department that is doing the test: When will my results be ready? How will I get my results? Summary A cardiac nuclear scan is a test that is done to check the flow of blood to your heart. Tell your doctor whether you are pregnant or may be pregnant. Before the test, ask your doctor about changing or stopping your normal medicines. This is important. Ask your doctor whether you can return to your normal activities. You may be asked to drink more fluids. This information is not  intended to replace advice given to you by your health care provider. Make sure you discuss any questions you have with your health care provider. Document Revised: 05/09/2018 Document Reviewed: 07/03/2017 Elsevier Patient Education  2021 [place].    Echocardiogram An echocardiogram is a test that uses sound waves (ultrasound) to produce images of the heart. Images from an echocardiogram can provide important information about: Heart size and shape. The size and thickness and movement of your heart's walls. Heart muscle function and strength. Heart valve function or if you have stenosis. Stenosis is when the heart valves are too narrow. If blood is flowing backward through the heart valves (regurgitation). A tumor or infectious growth around the heart valves. Areas of heart muscle that are not working well because of poor blood flow or injury from a  heart attack. Aneurysm detection. An aneurysm is a weak or damaged part of an artery wall. The wall bulges out from the normal force of blood pumping through the body. Tell a health care provider about: Any allergies you have. All medicines you are taking, including vitamins, herbs, eye drops, creams, and over-the-counter medicines. Any blood disorders you have. Any surgeries you have had. Any medical conditions you have. Whether you are pregnant or may be pregnant. What are the risks? Generally, this is a safe test. However, problems may occur, including an allergic reaction to dye (contrast) that may be used during the test. What happens before the test? No specific preparation is needed. You may eat and drink normally. What happens during the test? You will take off your clothes from the waist up and put on a hospital gown. Electrodes or electrocardiogram (ECG)patches may be placed on your chest. The electrodes or patches are then connected to a device that monitors your heart rate and rhythm. You will lie down on a table for an ultrasound exam. A gel will be applied to your chest to help sound waves pass through your skin. A handheld device, called a transducer, will be pressed against your chest and moved over your heart. The transducer produces sound waves that travel to your heart and bounce back (or "echo" back) to the transducer. These sound waves will be captured in real-time and changed into images of your heart that can be viewed on a video monitor. The images will be recorded on a computer and reviewed by your health care provider. You may be asked to change positions or hold your breath for a short time. This makes it easier to get different views or better views of your heart. In some cases, you may receive contrast through an IV in one of your veins. This can improve the quality of the pictures from your heart. The procedure may vary among health care providers and hospitals.     What can I expect after the test? You may return to your normal, everyday life, including diet, activities, and medicines, unless your health care provider tells you not to do that. Follow these instructions at home: It is up to you to get the results of your test. Ask your health care provider, or the department that is doing the test, when your results will be ready. Keep all follow-up visits. This is important. Summary An echocardiogram is a test that uses sound waves (ultrasound) to produce images of the heart. Images from an echocardiogram can provide important information about the size and shape of your heart, heart muscle function, heart valve function, and other possible heart problems. You do  not need to do anything to prepare before this test. You may eat and drink normally. After the echocardiogram is completed, you may return to your normal, everyday life, unless your health care provider tells you not to do that. This information is not intended to replace advice given to you by your health care provider. Make sure you discuss any questions you have with your health care provider. Document Revised: 09/10/2019 Document Reviewed: 09/10/2019 Elsevier Patient Education  2021 [employer].

## 2020-10-13 ENCOUNTER — Telehealth (HOSPITAL_COMMUNITY): Payer: Self-pay | Admitting: *Deleted

## 2020-10-13 NOTE — Telephone Encounter (Signed)
Patient given detailed instructions per Myocardial Perfusion Study Information Sheet for the test on 10/20/20 Patient notified to arrive 15 minutes early and that it is imperative to arrive on time for appointment to keep from having the test rescheduled.  If you need to cancel or reschedule your appointment, please call the office within 24 hours of your appointment. . Patient verbalized understanding. Krista [name]

## 2020-10-20 ENCOUNTER — Ambulatory Visit (INDEPENDENT_AMBULATORY_CARE_PROVIDER_SITE_OTHER): Payer: BC Managed Care – PPO

## 2020-10-20 ENCOUNTER — Other Ambulatory Visit: Payer: Self-pay

## 2020-10-20 DIAGNOSIS — R079 Chest pain, unspecified: Secondary | ICD-10-CM | POA: Diagnosis not present

## 2020-10-20 DIAGNOSIS — R0600 Dyspnea, unspecified: Secondary | ICD-10-CM

## 2020-10-20 DIAGNOSIS — R9431 Abnormal electrocardiogram [ECG] [EKG]: Secondary | ICD-10-CM

## 2020-10-20 DIAGNOSIS — R0609 Other forms of dyspnea: Secondary | ICD-10-CM

## 2020-10-20 DIAGNOSIS — R072 Precordial pain: Secondary | ICD-10-CM | POA: Diagnosis not present

## 2020-10-20 LAB — ECHOCARDIOGRAM COMPLETE
Area-P 1/2: 7.09 cm2
Height: 65 in
S' Lateral: 2.5 cm
Weight: 3824 oz

## 2020-10-20 MED ORDER — TECHNETIUM TC 99M TETROFOSMIN IV KIT
29.9000 | PACK | Freq: Once | INTRAVENOUS | Status: AC | PRN
  Administered 2020-10-20: 29.9 via INTRAVENOUS

## 2020-10-20 MED ORDER — PERFLUTREN LIPID MICROSPHERE
1.0000 mL | INTRAVENOUS | Status: AC | PRN
  Administered 2020-10-20: 6 mL via INTRAVENOUS

## 2020-10-21 ENCOUNTER — Ambulatory Visit: Payer: BC Managed Care – PPO

## 2020-10-21 LAB — MYOCARDIAL PERFUSION IMAGING
Angina Index: 0
Duke Treadmill Score: 3
Estimated workload: 9.4
Exercise duration (min): 7 min
Exercise duration (sec): 35 s
LV dias vol: 70 mL (ref 46–106)
LV sys vol: 20 mL
MPHR: 169 {beats}/min
Nuc Stress EF: 72 %
Peak HR: 162 {beats}/min
Percent HR: 92 %
Rest HR: 78 {beats}/min
Rest Nuclear Isotope Dose: 31 mCi
SDS: 2
SRS: 0
SSS: 2
ST Depression (mm): 1 mm
Stress Nuclear Isotope Dose: 29.9 mCi
TID: 0.86

## 2020-10-21 MED ORDER — TECHNETIUM TC 99M TETROFOSMIN IV KIT
31.0000 | PACK | Freq: Once | INTRAVENOUS | Status: AC | PRN
  Administered 2020-10-21: 31 via INTRAVENOUS

## 2020-12-08 ENCOUNTER — Other Ambulatory Visit: Payer: Self-pay | Admitting: Internal Medicine

## 2020-12-08 DIAGNOSIS — Z1231 Encounter for screening mammogram for malignant neoplasm of breast: Secondary | ICD-10-CM

## 2020-12-12 ENCOUNTER — Ambulatory Visit: Payer: BC Managed Care – PPO

## 2021-01-08 ENCOUNTER — Encounter: Payer: Self-pay | Admitting: Sports Medicine

## 2021-01-08 ENCOUNTER — Ambulatory Visit: Payer: BC Managed Care – PPO | Admitting: Sports Medicine

## 2021-01-08 DIAGNOSIS — M79672 Pain in left foot: Secondary | ICD-10-CM

## 2021-01-08 DIAGNOSIS — M21619 Bunion of unspecified foot: Secondary | ICD-10-CM

## 2021-01-08 DIAGNOSIS — M2042 Other hammer toe(s) (acquired), left foot: Secondary | ICD-10-CM | POA: Diagnosis not present

## 2021-01-08 DIAGNOSIS — M792 Neuralgia and neuritis, unspecified: Secondary | ICD-10-CM

## 2021-01-08 DIAGNOSIS — M779 Enthesopathy, unspecified: Secondary | ICD-10-CM

## 2021-01-08 DIAGNOSIS — E088 Diabetes mellitus due to underlying condition with unspecified complications: Secondary | ICD-10-CM

## 2021-01-08 NOTE — Patient Instructions (Signed)
Nervive nerve relief supplement for your nerves can be purchased OTC at [employer] List For tennis shoes recommend:  [place] Can be purchased at [employer] sports or [employer] arch fit Can be purchased at any major retailers  Vionic  SAS Can be purchased at [employer] or [employer]   For work shoes recommend: [employer] Work [employer] Can be purchased at a variety of places or [employer]   For casual shoes recommend: Oofos Can be purchased at [employer] sports or [employer]  Can be purchased at [employer] or [employer] recommend: [employer] Can be purchased in office/[facility] [employer] Can be purchased at [employer] sports or [employer] Can be purchased at [place] recommend: Leisure centre manager at [employer] and [employer]

## 2021-01-08 NOTE — Progress Notes (Signed)
Subjective: Krista [name] is a 50 y.o. female patient who presents to office for evaluation of Left foot pain. Patient reportrs that she had some burning pain until she was put on tumeric and states that now the pain is better.  Patient reports that she is diabetic and is concerned about the changes at her second toe as well as noticing that she has more of a bunion and wanted to have her foot checked especially since she had increased burning pain.  Patient denies any other pedal complaints at this time.  Fasting blood sugar not recorded  Patient Active Problem List   Diagnosis Date Noted   Diabetes mellitus due to underlying condition with unspecified complications ([place]) 57/26/2035   Palpitations 09/28/2020   Chest pain 09/28/2020   Anxiety 09/25/2020   Arthritis 09/25/2020   DM type 2 (diabetes mellitus, type 2) ([place]) 09/25/2020   Fibroid 09/25/2020   GERD (gastroesophageal reflux disease) 09/25/2020   History of palpitations 09/25/2020   Hypertension 09/25/2020   Seasonal allergies 09/25/2020   STD (sexually transmitted disease) 09/25/2020   SVD (spontaneous vaginal delivery) 09/25/2020   Vulvar cyst 09/25/2020   Status post laparoscopic hysterectomy 07/21/2015   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     amLODipine (NORVASC) 10 MG tablet Take 10 mg by mouth daily.     baclofen (LIORESAL) 10 MG tablet Take 10 mg by mouth daily as needed for muscle spasms.      budesonide-formoterol (SYMBICORT) 160-4.5 MCG/ACT inhaler Inhale 2 puffs into the lungs 2 (two) times daily as needed  for shortness of breath.     Dulaglutide (TRULICITY) 1.5 DH/7.4BU SOPN Inject 1.5 mg into the skin every Saturday.     DULoxetine (CYMBALTA) 60 MG capsule Take 60 mg by mouth daily.      Estradiol (VAGIFEM) 10 MCG TABS vaginal tablet Place 1 tablet (10 mcg total) vaginally 2 (two) times a week. Use every night before bed for two weeks when you first begin this medicine, then after the first two weeks, begin using it twice a week. 42 tablet 3   estradiol (VIVELLE-DOT) 0.05 MG/24HR patch Place 1 patch (0.05 mg total) onto the skin 2 (two) times a week. 24 patch 2   fexofenadine (ALLEGRA) 180 MG tablet Take 180 mg by mouth daily.      fluconazole (DIFLUCAN) 150 MG tablet Take 1 tablet (150 mg total) by mouth daily. May repeat in 72 hours if needed. 2 tablet 0   fluticasone (FLONASE) 50 MCG/ACT nasal spray Place 2 sprays into both nostrils daily.      furosemide (LASIX) 20 MG tablet Take 20 mg by mouth 2 (two) times daily as needed for edema.     JANUVIA 100 MG tablet Take 100 mg by mouth daily.     linagliptin (TRADJENTA) 5 MG TABS tablet Take 5 mg by mouth daily.     losartan-hydrochlorothiazide (HYZAAR) 100-25 MG tablet Take 1 tablet by mouth daily.      meloxicam (MOBIC) 15 MG tablet Take 15 mg by mouth daily.  metFORMIN (GLUCOPHAGE) 500 MG tablet Take 500 mg by mouth 2 (two) times daily.     montelukast (SINGULAIR) 10 MG tablet Take 10 mg by mouth at bedtime.      Multiple Vitamin (MULTIVITAMIN) capsule Take 1 capsule by mouth daily.     nitroGLYCERIN (NITROSTAT) 0.4 MG SL tablet Place 0.4 mg under the tongue as needed for chest pain.     oxybutynin (DITROPAN-XL) 10 MG 24 hr tablet Take 10 mg by mouth at bedtime.     pantoprazole (PROTONIX) 40 MG tablet Take 40 mg by mouth at bedtime.      potassium chloride (KLOR-CON) 10 MEQ tablet Take 10 mEq by mouth daily.     propranolol ER (INDERAL LA) 160 MG SR capsule Take 160 mg by mouth daily.     rosuvastatin (CRESTOR) 5 MG tablet Take 5 mg by  mouth at bedtime.      traMADol (ULTRAM) 50 MG tablet Take 50 mg by mouth daily as needed for pain.     Vitamin D, Ergocalciferol, (DRISDOL) 50000 UNITS CAPS capsule Take 50,000 Units by mouth every Thursday.      No current facility-administered medications on file prior to visit.    Allergies  Allergen Reactions   Ace Inhibitors Cough   Elemental Sulfur     Itching rash   Penicillins Itching, Swelling and Rash    Has patient had a PCN reaction causing immediate rash, facial/tongue/throat swelling, SOB or lightheadedness with hypotension: Yes Has patient had a PCN reaction causing severe rash involving mucus membranes or skin necrosis: Yes Has patient had a PCN reaction that required hospitalization No Has patient had a PCN reaction occurring within the last 10 years: No If all of the above answers are "NO", then may proceed with Cephalosporin use.     Objective:  General: Alert and oriented x3 in no acute distress  Dermatology: No open lesions bilateral lower extremities, no webspace macerations, no ecchymosis bilateral, all nails x 10 are well manicured.  Vascular: Dorsalis Pedis and Posterior Tibial pedal pulses palpable, Capillary Fill Time 3 seconds,(+) pedal hair growth bilateral, no edema bilateral lower extremities, Temperature gradient within normal limits.  Neurology: Johney Maine sensation intact via light touch bilateral, Protective sensation intact with Thornell Mule Monofilament to all pedal sites, Position sense intact, vibratory intact bilateral,(- )Tinels sign bilateral especially over the peroneal nerve branches of the left foot.   Musculoskeletal: No reproducible tenderness to palpation to the left foot.  There is bunion and hammertoe deformity with mild dorsal dislocation negative Lachman test.  Pes planus foot type.   Assessment and Plan: Problem List Items Addressed This Visit       Endocrine   Diabetes mellitus due to underlying condition with unspecified  complications ([place])   Relevant Medications   JANUVIA 100 MG tablet   Other Visit Diagnoses     Neuritis    -  Primary   Bunion       Hammer toe of left foot       Left foot pain       Capsulitis            -Complete examination performed -Discussed treatement options for likely neuritis possible secondary to mechanical overuse and bunion and hammertoe -No reinjection given at this time since there was no localized area of pain -Dispensed bunion shield for patient to use as directed -Encourage patient to continue with anti-inflammatory medication -Advised patient that she can try topical pain rubs or soaking for  any additional relief however must be very careful if she decides to do so -Recommend good supportive shoes daily for foot type, shoe list provided for patient -And advised patient that at this time she is still considered a very low risk diabetic and there is no immediate complications that I can foresee at this time with her foot -Patient to return to office as needed or sooner if condition worsens.  Landis Martins, DPM

## 2021-01-18 ENCOUNTER — Ambulatory Visit
Admission: RE | Admit: 2021-01-18 | Discharge: 2021-01-18 | Disposition: A | Discharge status: Home / Self Care | Payer: BC Managed Care – PPO | Source: Ambulatory Visit | Attending: Internal Medicine | Admitting: Internal Medicine

## 2021-01-18 DIAGNOSIS — Z1231 Encounter for screening mammogram for malignant neoplasm of breast: Secondary | ICD-10-CM

## 2021-02-05 ENCOUNTER — Telehealth: Payer: Self-pay | Admitting: Gastroenterology

## 2021-02-05 ENCOUNTER — Other Ambulatory Visit: Payer: Self-pay

## 2021-02-05 DIAGNOSIS — K3189 Other diseases of stomach and duodenum: Secondary | ICD-10-CM

## 2021-02-05 NOTE — Telephone Encounter (Signed)
EUS has been scheduled for 03/25/21 at 930 am at [facility] with DJ.   Left message on machine to call back

## 2021-02-05 NOTE — Telephone Encounter (Signed)
Her primary gastroenterologist Dr. Lyda Jester sent a recent office note.  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   Patty, can you please get in touch with her and offer her my first available endoscopic ultrasound appointment for gastric nodule.  Thanks

## 2021-02-08 NOTE — Telephone Encounter (Signed)
EUS scheduled, pt instructed and medications reviewed.  Patient instructions mailed to home.  Patient to call with any questions or concerns.  

## 2021-02-11 NOTE — Telephone Encounter (Signed)
Inbound call from patient states she is wondering if she can be seen sooner that 2/23

## 2021-02-11 NOTE — Telephone Encounter (Signed)
I have advised the pt that I do not have any sooner appts but I will keep her on my list in case of a cancellation. The pt has been advised of the information and verbalized understanding.

## 2021-03-05 ENCOUNTER — Telehealth: Payer: Self-pay | Admitting: Gastroenterology

## 2021-03-05 NOTE — Telephone Encounter (Signed)
The pt has been advised that we do not have any sooner appts at this time.  I will keep her in mind if we have something open up. She will also call her primary GI for recommendations

## 2021-03-05 NOTE — Telephone Encounter (Signed)
Patient called wanting to have her procedure moved up.  She is very worried because she is still losing weight and her iron levels are low and she wants to find out what's going on.  Please call patient and advise.  Thank you,.

## 2021-03-17 ENCOUNTER — Encounter (HOSPITAL_COMMUNITY): Payer: Self-pay | Admitting: Gastroenterology

## 2021-03-17 NOTE — Progress Notes (Signed)
Attempted to obtain medical history via telephone, unable to reach at this time. I left a voicemail to return pre surgical testing department's phone call.  

## 2021-03-25 ENCOUNTER — Encounter (HOSPITAL_COMMUNITY)
Admission: RE | Disposition: A | Discharge status: Home / Self Care | Payer: Self-pay | Source: Home / Self Care | Attending: Gastroenterology

## 2021-03-25 ENCOUNTER — Encounter (HOSPITAL_COMMUNITY): Payer: Self-pay | Admitting: Gastroenterology

## 2021-03-25 ENCOUNTER — Ambulatory Visit (HOSPITAL_COMMUNITY): Payer: BC Managed Care – PPO

## 2021-03-25 ENCOUNTER — Ambulatory Visit (HOSPITAL_COMMUNITY)
Admission: RE | Admit: 2021-03-25 | Discharge: 2021-03-25 | Disposition: A | Discharge status: Home / Self Care | Payer: BC Managed Care – PPO | Attending: Gastroenterology | Admitting: Gastroenterology

## 2021-03-25 ENCOUNTER — Other Ambulatory Visit: Payer: Self-pay

## 2021-03-25 DIAGNOSIS — I10 Essential (primary) hypertension: Secondary | ICD-10-CM | POA: Diagnosis not present

## 2021-03-25 DIAGNOSIS — Z7951 Long term (current) use of inhaled steroids: Secondary | ICD-10-CM | POA: Diagnosis not present

## 2021-03-25 DIAGNOSIS — Z7985 Long-term (current) use of injectable non-insulin antidiabetic drugs: Secondary | ICD-10-CM | POA: Insufficient documentation

## 2021-03-25 DIAGNOSIS — G4733 Obstructive sleep apnea (adult) (pediatric): Secondary | ICD-10-CM | POA: Insufficient documentation

## 2021-03-25 DIAGNOSIS — Z79899 Other long term (current) drug therapy: Secondary | ICD-10-CM | POA: Diagnosis not present

## 2021-03-25 DIAGNOSIS — K219 Gastro-esophageal reflux disease without esophagitis: Secondary | ICD-10-CM | POA: Diagnosis not present

## 2021-03-25 DIAGNOSIS — Z6837 Body mass index (BMI) 37.0-37.9, adult: Secondary | ICD-10-CM | POA: Diagnosis not present

## 2021-03-25 DIAGNOSIS — F419 Anxiety disorder, unspecified: Secondary | ICD-10-CM | POA: Insufficient documentation

## 2021-03-25 DIAGNOSIS — K3189 Other diseases of stomach and duodenum: Secondary | ICD-10-CM | POA: Insufficient documentation

## 2021-03-25 DIAGNOSIS — E119 Type 2 diabetes mellitus without complications: Secondary | ICD-10-CM | POA: Insufficient documentation

## 2021-03-25 DIAGNOSIS — Z7984 Long term (current) use of oral hypoglycemic drugs: Secondary | ICD-10-CM | POA: Insufficient documentation

## 2021-03-25 HISTORY — PX: ESOPHAGOGASTRODUODENOSCOPY: SHX5428

## 2021-03-25 HISTORY — PX: EUS: SHX5427

## 2021-03-25 LAB — GLUCOSE, CAPILLARY: Glucose-Capillary: 98 mg/dL (ref 70–99)

## 2021-03-25 SURGERY — UPPER ENDOSCOPIC ULTRASOUND (EUS) RADIAL
Anesthesia: Monitor Anesthesia Care

## 2021-03-25 MED ORDER — PROPOFOL 10 MG/ML IV BOLUS
INTRAVENOUS | Status: DC | PRN
  Administered 2021-03-25 (×3): 20 mg via INTRAVENOUS
  Administered 2021-03-25: 10 mg via INTRAVENOUS

## 2021-03-25 MED ORDER — PROPOFOL 500 MG/50ML IV EMUL
INTRAVENOUS | Status: AC
  Filled 2021-03-25: qty 50

## 2021-03-25 MED ORDER — PROPOFOL 500 MG/50ML IV EMUL
INTRAVENOUS | Status: DC | PRN
  Administered 2021-03-25: 125 ug/kg/min via INTRAVENOUS

## 2021-03-25 MED ORDER — LIDOCAINE 2% (20 MG/ML) 5 ML SYRINGE
INTRAMUSCULAR | Status: DC | PRN
  Administered 2021-03-25: 80 mg via INTRAVENOUS

## 2021-03-25 MED ORDER — LACTATED RINGERS IV SOLN
INTRAVENOUS | Status: DC
  Administered 2021-03-25: 1000 mL via INTRAVENOUS

## 2021-03-25 NOTE — Op Note (Signed)
[facility] Patient Name: Krista [name] Procedure Date: 03/25/2021 MRN: [id number] Attending MD: Milus Banister , MD Date of Birth: 11/17/1970 CSN: [id number] Age: 51 Admit Type: Outpatient Procedure:                Upper EUS Indications:              incidental subepithelial lesion noted by EGD Dr.                            Lyda Jester in 2021. EUS 12/2019 Dr. Ardis Hughs 1.7cm                            by 1.5cm lesion with clear communication with MP                            layer, cytology showed spindle cells, CD117+.                            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 Estimated blood loss:                            None. Estimated Blood Loss:     Estimated blood loss: none. Procedure:                Pre-Anesthesia Assessment:                           - Prior to the procedure, a History and Physical                            was performed, and patient medications and                            allergies were reviewed. The patient's tolerance of                            previous anesthesia was also reviewed. The risks                            and benefits of the procedure and the sedation                            options and risks were discussed with the patient.  All questions were answered, and informed consent                            was obtained. Prior Anticoagulants: The patient has                            taken no previous anticoagulant or antiplatelet                            agents. ASA Grade Assessment: II - A patient with                            mild systemic disease. After reviewing the risks                            and  benefits, the patient was deemed in                            satisfactory condition to undergo the procedure.                           After obtaining informed consent, the endoscope was                            passed under direct vision. Throughout the                            procedure, the patient's blood pressure, pulse, and                            oxygen saturations were monitored continuously. The                            [id number] ([id number]) Olympus radial ultrasound                            scope was introduced through the mouth, and                            advanced to the second part of duodenum. The upper                            EUS was accomplished without difficulty. The                            patient tolerated the procedure well. Scope In: Scope Out: Findings:      ENDOSCOPIC FINDING: :      The examined esophagus was endoscopically normal.      The previously noted proximal stomach subepithelial lesion was again       noted in the proximal stomach, about 1cm distal to the GE junction. This       measured about 2cm endoscopicall and there was normal overlying mucosa.      The examined duodenum was endoscopically normal.  ENDOSONOGRAPHIC FINDING: :      1. The proximal stomach subepithelial lesion noted above correlated with       a 19.8m by 157mhypoechoic, homogeneous, well circumscribed mass that       clearly commmunicates with the muscularis propria layer of the gastric       wall, about 1cm distal to the GE junction.      2. No perigastric adenopathy.      3. The gastric wall was otherwise normal.      4. Limited view of the liver, spleen, pancreas, CBD were all normal.      5. Cholelithiasis Impression:               - The proximal gastric subepithelial lesion has                            probably grown slightly since first EUS evaluation                            in 2021. 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 I did not rebiopsy it                            today. She is interested in having it removed                            surgically instead of following it with interval                            surveillance. I will communicate this with her                            primary GI Dr. MiseLyda Jester am happy to help                            [place]. Moderate Sedation:      Not Applicable - Patient had care per Anesthesia. Recommendation:           - Discharge patient to home. Procedure Code(s):        --- Professional ---                           [id number], flexible,                            transoral; with endoscopic ultrasound examination                            limited to the esophagus, stomach or duodenum, and  adjacent structures Diagnosis Code(s):        --- Professional ---                           K31.89, Other diseases of stomach and duodenum CPT copyright 2019 American Medical Association. All rights reserved. The codes documented in this report are preliminary and upon coder review may  be revised to meet current compliance requirements. Milus Banister, MD 03/25/2021 10:02:23 AM This report has been signed electronically. Number of Addenda: 0

## 2021-03-25 NOTE — Anesthesia Preprocedure Evaluation (Addendum)
Anesthesia Evaluation  Patient identified by MRN, date of birth, ID band Patient awake    Reviewed: Allergy & Precautions, NPO status , Patient's Chart, lab work & pertinent test results, reviewed documented beta blocker date and time   Airway Mallampati: II  TM Distance: >3 FB Neck ROM: Full    Dental no notable dental hx. (+) Teeth Intact, Dental Advisory Given   Pulmonary sleep apnea and Continuous Positive Airway Pressure Ventilation ,    Pulmonary exam normal breath sounds clear to auscultation       Cardiovascular hypertension, Pt. on medications and Pt. on home beta blockers + DOE  Normal cardiovascular exam Rhythm:Regular Rate:Normal  TTE 2022 1. Left ventricular ejection fraction, by estimation, is 60 to 65%. The  left ventricle has normal function. The left ventricle has no regional  wall motion abnormalities. There is mild left ventricular hypertrophy.  Left ventricular diastolic parameters  are consistent with Grade I diastolic dysfunction (impaired relaxation).  2. Right ventricular systolic function is normal. The right ventricular  size is normal. There is normal pulmonary artery systolic pressure.  3. The mitral valve is normal in structure. No evidence of mitral valve  regurgitation. No evidence of mitral stenosis.  4. The aortic valve is normal in structure. Aortic valve regurgitation is  not visualized. No aortic stenosis is present.  5. The inferior vena cava is normal in size with greater than 50%  respiratory variability, suggesting right atrial pressure of 3 mmHg.   Stress Test 2022   Findings are consistent with no prior ischemia. The study is low risk.   1.0 mm of ST depression (II, III, aVF and V4) was noted.   Left ventricular function is normal. Nuclear stress EF: 72 %. The left ventricular ejection fraction is hyperdynamic (>65%).   Prior study available for comparison from 11/28/2012.     Neuro/Psych PSYCHIATRIC DISORDERS Anxiety negative neurological ROS     GI/Hepatic Neg liver ROS, GERD  ,  Endo/Other  diabetes, Type 2, Oral Hypoglycemic AgentsObese BMI 37  Renal/GU negative Renal ROS  negative genitourinary   Musculoskeletal negative musculoskeletal ROS (+)   Abdominal   Peds  Hematology negative hematology ROS (+)   Anesthesia Other Findings   Reproductive/Obstetrics                            Anesthesia Physical Anesthesia Plan  ASA: 3  Anesthesia Plan: MAC   Post-op Pain Management:    Induction: Intravenous  PONV Risk Score and Plan: Propofol infusion and Treatment may vary due to age or medical condition  Airway Management Planned: Natural Airway  Additional Equipment:   Intra-op Plan:   Post-operative Plan:   Informed Consent: I have reviewed the patients History and Physical, chart, labs and discussed the procedure including the risks, benefits and alternatives for the proposed anesthesia with the patient or authorized representative who has indicated his/her understanding and acceptance.     Dental advisory given  Plan Discussed with: CRNA  Anesthesia Plan Comments:         Anesthesia Quick Evaluation

## 2021-03-25 NOTE — Transfer of Care (Signed)
Immediate Anesthesia Transfer of Care Note  Patient: Krista [name]  Procedure(s) Performed: UPPER ENDOSCOPIC ULTRASOUND (EUS) RADIAL ESOPHAGOGASTRODUODENOSCOPY (EGD)  Patient Location: PACU  Anesthesia Type:MAC  Level of Consciousness: awake, alert  and oriented  Airway & Oxygen Therapy: Patient Spontanous Breathing and Patient connected to face mask oxygen  Post-op Assessment: Report given to RN and Post -op Vital signs reviewed and stable  Post vital signs: Reviewed and stable  Last Vitals:  Vitals Value Taken Time  BP    Temp    Pulse    Resp    SpO2      Last Pain:  Vitals:   03/25/21 0828  TempSrc: Temporal  PainSc: 0-No pain         Complications: No notable events documented.

## 2021-03-25 NOTE — Discharge Instructions (Signed)
YOU HAD AN ENDOSCOPIC PROCEDURE TODAY: Refer to the procedure report and other information in the discharge instructions given to you for any specific questions about what was found during the examination. If this information does not answer your questions, please call [place] office at [phone] to clarify.  ° °YOU SHOULD EXPECT: Some feelings of bloating in the abdomen. Passage of more gas than usual. Walking can help get rid of the air that was put into your GI tract during the procedure and reduce the bloating. If you had a lower endoscopy (such as a colonoscopy or flexible sigmoidoscopy) you may notice spotting of blood in your stool or on the toilet paper. Some abdominal soreness may be present for a day or two, also. ° °DIET: Your first meal following the procedure should be a light meal and then it is ok to progress to your normal diet. A half-sandwich or bowl of soup is an example of a good first meal. Heavy or fried foods are harder to digest and may make you feel nauseous or bloated. Drink plenty of fluids but you should avoid alcoholic beverages for 24 hours. If you had a esophageal dilation, please see attached instructions for diet.   ° °ACTIVITY: Your care partner should take you home directly after the procedure. You should plan to take it easy, moving slowly for the rest of the day. You can resume normal activity the day after the procedure however YOU SHOULD NOT DRIVE, use power tools, machinery or perform tasks that involve climbing or major physical exertion for 24 hours (because of the sedation medicines used during the test).  ° °SYMPTOMS TO REPORT IMMEDIATELY: °A gastroenterologist can be reached at any hour. Please call [phone]  for any of the following symptoms:  °Following lower endoscopy (colonoscopy, flexible sigmoidoscopy) °Excessive amounts of blood in the stool  °Significant tenderness, worsening of abdominal pains  °Swelling of the abdomen that is new, acute  °Fever of 100° or  higher  °Following upper endoscopy (EGD, EUS, ERCP, esophageal dilation) °Vomiting of blood or coffee ground material  °New, significant abdominal pain  °New, significant chest pain or pain under the shoulder blades  °Painful or persistently difficult swallowing  °New shortness of breath  °Black, tarry-looking or red, bloody stools ° °FOLLOW UP:  °If any biopsies were taken you will be contacted by phone or by letter within the next 1-3 weeks. Call [phone]  if you have not heard about the biopsies in 3 weeks.  °Please also call with any specific questions about appointments or follow up tests. ° °

## 2021-03-25 NOTE — Anesthesia Postprocedure Evaluation (Signed)
Anesthesia Post Note  Patient: Krista [name], Krista [name]  Procedure(s) Performed: UPPER ENDOSCOPIC ULTRASOUND (EUS) RADIAL ESOPHAGOGASTRODUODENOSCOPY (EGD)     Patient location during evaluation: Endoscopy Anesthesia Type: MAC Level of consciousness: awake and alert Pain management: pain level controlled Vital Signs Assessment: post-procedure vital signs reviewed and stable Respiratory status: spontaneous breathing, nonlabored ventilation, respiratory function stable and patient connected to nasal cannula oxygen Cardiovascular status: blood pressure returned to baseline and stable Postop Assessment: no apparent nausea or vomiting Anesthetic complications: no   No notable events documented.  Last Vitals:  Vitals:   03/25/21 1010 03/25/21 1020  BP: 121/71 118/74  Pulse: 87 79  Resp: 17 18  Temp:    SpO2: 99% 100%    Last Pain:  Vitals:   03/25/21 1020  TempSrc:   PainSc: 0-No pain                 Krista [name]

## 2021-03-25 NOTE — H&P (Signed)
HPI: This is a woman with incidental subepithelial lesion noted by Dr. Lyda Jester in 2021. EUS 12/2019 Dr. Ardis Hughs 1.7cm by 1.5cm lesion with clear communication with MP layer, cytology showed spindle cells, CD117+. 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   GERD (gastroesophageal reflux disease)    History of palpitations yrs ago   PVCs - wore holter monitor, negative results   HTN (hypertension) 11/22/2012   Hypertension    Morbid obesity ([place]) 11/22/2012   OSA on CPAP 11/22/2012   Palpitations    Restless leg syndrome    patient denies this dx   Seasonal allergies    Status post laparoscopic hysterectomy 07/21/2015   STD (sexually transmitted disease)    hx HPV   SVD (spontaneous vaginal delivery)    x 1   Vulvar cyst     Past Surgical History:  Procedure Laterality Date   CERVICAL BIOPSY  W/ LOOP ELECTRODE EXCISION  2006   colonscopy  09/2019   polyps return in 5 years   COLPOSCOPY  2006   CYSTOSCOPY N/A 07/21/2015   Procedure: CYSTOSCOPY;  Surgeon: [name], MD;  Location: [facility];  Service: Gynecology;  Laterality: N/A;   ESOPHAGOGASTRODUODENOSCOPY (EGD) WITH PROPOFOL N/A 12/12/2019   Procedure: ESOPHAGOGASTRODUODENOSCOPY (EGD) WITH PROPOFOL;  Surgeon: Milus Banister, MD;  Location: [facility];  Service: Endoscopy;  Laterality: N/A;   EUS N/A 12/12/2019   Procedure: UPPER ENDOSCOPIC ULTRASOUND (EUS) LINEAR;  Surgeon: Milus Banister, MD;  Location: [facility];  Service:  Endoscopy;  Laterality: N/A;   FINE NEEDLE ASPIRATION N/A 12/12/2019   Procedure: FINE NEEDLE ASPIRATION (FNA) LINEAR;  Surgeon: Milus Banister, MD;  Location: [facility];  Service: Endoscopy;  Laterality: N/A;   LESION REMOVAL Left 11/11/2019   Procedure: EXCISION OF LEFT VULVAR CYST;  Surgeon: [name], MD;  Location: [facility];  Service: Gynecology;  Laterality: Left;   ROBOTIC ASSISTED TOTAL HYSTERECTOMY WITH SALPINGECTOMY Bilateral 07/21/2015   Procedure: ROBOTIC ASSISTED TOTAL HYSTERECTOMY WITH SALPINGECTOMY;  Surgeon: [name], MD;  Location: [facility];  Service: Gynecology;  Laterality: Bilateral;   UPPER GI ENDOSCOPY  09/2019   WISDOM TOOTH EXTRACTION  1990's    Current Outpatient Medications  Medication Instructions   acetaminophen (TYLENOL) 1,000 mg, Oral, Every 8 hours PRN   albuterol (VENTOLIN HFA) 108 (90 Base) MCG/ACT inhaler 2 puffs, Inhalation, Every 4 hours PRN   amLODipine (NORVASC) 10 mg, Oral, Daily   baclofen (LIORESAL) 10 mg, Oral, Daily PRN   budesonide-formoterol (SYMBICORT) 160-4.5 MCG/ACT inhaler 2 puffs, Inhalation, 2 times daily PRN   DULoxetine (CYMBALTA) 60 mg, Oral, Daily   estradiol (VIVELLE-DOT) 0.05 mg, Transdermal, 2 times weekly   fexofenadine (ALLEGRA) 180 mg, Oral, Daily   fluticasone (FLONASE) 50 MCG/ACT nasal spray 2 sprays, Each Nare, Daily   GLUCOSAMINE CHONDROITIN COMPLX PO 1 capsule, Oral,  2 times daily   Januvia 100 mg, Oral, Daily   losartan-hydrochlorothiazide (HYZAAR) 100-25 MG tablet 1 tablet, Oral, Daily   metFORMIN (GLUCOPHAGE) 500 mg, Oral, 2 times daily   montelukast (SINGULAIR) 10 mg, Oral, Daily at bedtime   Multiple Vitamin (MULTIVITAMIN) capsule 1 capsule, Daily   nitroGLYCERIN (NITROSTAT) 0.4 mg, Sublingual, As needed   potassium chloride (KLOR-CON) 10 MEQ tablet 10 mEq, Oral, Daily   propranolol ER (INDERAL LA) 160 mg, Oral, Daily   rosuvastatin (CRESTOR) 5 mg, Oral, Daily at  bedtime   Trulicity 1.5 mg, Subcutaneous, Every Sat   Vitamin D (Ergocalciferol) (DRISDOL) 50,000 Units, Oral, Every Thu    Allergies as of 02/05/2021 - Review Complete 01/08/2021  Allergen Reaction Noted   Ace inhibitors Cough 11/21/2012   Elemental sulfur  10/10/2019   Penicillins Itching, Swelling, and Rash 09/13/2012    Family History  Problem Relation Age of Onset   Hypertension Mother    Diabetes Father    Hypertension Father    Ovarian cancer Maternal Grandmother    Hypertension Maternal Grandmother    Stroke Maternal Grandfather    Diabetes Paternal Grandmother    Hypertension Paternal Grandmother    Alzheimer's disease Paternal Grandfather    Cancer Maternal Aunt        dec bladder ca   Breast cancer Paternal Aunt    Breast cancer Cousin     Social History   Socioeconomic History   Marital status: Married    Spouse name: Not on file   Number of children: 1   Years of education: 14   Highest education level: Not on file  Occupational History   Occupation: Environmental consultant - [employer]    Employer: [employer]  Tobacco Use   Smoking status: Never   Smokeless tobacco: Never  Scientific laboratory technician Use: Never used  Substance and Sexual Activity   Alcohol use: Yes    Comment: 1 drink/month   Drug use: No   Sexual activity: Yes    Partners: Male    Birth control/protection: Surgical    Comment: R-TLH/Bil.salingectomy  Other Topics Concern   Not on file  Social History Narrative   Not on file   Social Determinants of Health   Financial Resource Strain: Not on file  Food Insecurity: Not on file  Transportation Needs: Not on file  Physical Activity: Not on file  Stress: Not on file  Social Connections: Not on file  Intimate Partner Violence: Not on file     Physical Exam: BP 126/79    Pulse 84    Resp 17    Ht '5\' 5"'  (1.651 m)    Wt 99.8 kg    LMP 07/02/2015 (Approximate)    SpO2 100%    BMI 36.61 kg/m  Constitutional: generally  well-appearing Psychiatric: alert and oriented x3 Abdomen: soft, nontender, nondistended, no obvious ascites, no peritoneal signs, normal bowel sounds No peripheral edema noted in lower extremities  Assessment and plan: 51 y.o. female with gastric lesion  For upper eus evaluation today  Please see the "Patient Instructions" section for addition details about the plan.  Owens Loffler, MD [facility] 03/25/2021, 8:44 AM

## 2021-03-26 ENCOUNTER — Encounter (HOSPITAL_COMMUNITY): Payer: Self-pay | Admitting: Gastroenterology

## 2021-04-05 ENCOUNTER — Other Ambulatory Visit: Payer: Self-pay

## 2021-04-05 ENCOUNTER — Encounter: Payer: Self-pay | Admitting: Cardiology

## 2021-04-05 ENCOUNTER — Ambulatory Visit: Payer: BC Managed Care – PPO | Admitting: Cardiology

## 2021-04-05 VITALS — BP 98/60 | HR 88 | Ht 65.6 in | Wt 219.6 lb

## 2021-04-05 DIAGNOSIS — E088 Diabetes mellitus due to underlying condition with unspecified complications: Secondary | ICD-10-CM | POA: Diagnosis not present

## 2021-04-05 DIAGNOSIS — E782 Mixed hyperlipidemia: Secondary | ICD-10-CM

## 2021-04-05 DIAGNOSIS — I10 Essential (primary) hypertension: Secondary | ICD-10-CM | POA: Diagnosis not present

## 2021-04-05 DIAGNOSIS — I493 Ventricular premature depolarization: Secondary | ICD-10-CM | POA: Diagnosis not present

## 2021-04-05 DIAGNOSIS — R002 Palpitations: Secondary | ICD-10-CM | POA: Diagnosis not present

## 2021-04-05 HISTORY — DX: Mixed hyperlipidemia: E78.2

## 2021-04-05 MED ORDER — AMLODIPINE BESYLATE 5 MG PO TABS: 5.0000 mg | ORAL_TABLET | Freq: Every day | ORAL | 3 refills | Status: DC

## 2021-04-05 NOTE — Patient Instructions (Signed)
Medication Instructions:  ?Your physician has recommended you make the following change in your medication:  ? ?Decrease your Amlodipine to 5 mg. Take 1/2 tablet of your current dose and your next refill will be sent in for a 10 mg tablet. ?  ?*If you need a refill on your cardiac medications before your next appointment, please call your pharmacy* ? ? ?Lab Work: ?None ordered ?If you have labs (blood work) drawn today and your tests are completely normal, you will receive your results only by: ?MyChart Message (if you have MyChart) OR ?A paper copy in the mail ?If you have any lab test that is abnormal or we need to change your treatment, we will call you to review the results. ? ? ?Testing/Procedures: ?None ordered ? ? ?Follow-Up: ?At [facility], you and your health needs are our priority.  As part of our continuing mission to provide you with exceptional heart care, we have created designated Provider Care Teams.  These Care Teams include your primary Cardiologist (physician) and Advanced Practice Providers (APPs -  Physician Assistants and Nurse Practitioners) who all work together to provide you with the care you need, when you need it. ? ?We recommend signing up for the patient portal called "MyChart".  Sign up information is provided on this After Visit Summary.  MyChart is used to connect with patients for Virtual Visits (Telemedicine).  Patients are able to view lab/test results, encounter notes, upcoming appointments, etc.  Non-urgent messages can be sent to your provider as well.   ?To learn more about what you can do with MyChart, go to [email or web address].   ? ?Your next appointment:   ?9 month(s) ? ?The format for your next appointment:   ?In Person ? ?Provider:   ?Jyl Heinz, MD ? ? ?Other Instructions ?NA ? ?

## 2021-04-05 NOTE — Progress Notes (Signed)
Cardiology Office Note:    Date:  04/05/2021   ID:  Krista [name], DOB [birth year], MRN [id number]  PCP:  [name], MD  Cardiologist:  Jenean Lindau, MD   Referring MD: [name], MD    ASSESSMENT:    1. Primary hypertension   2. Frequent PVCs   3. Diabetes mellitus due to underlying condition with unspecified complications (HCC)   4. Palpitations   5. Mixed dyslipidemia    PLAN:    In order of problems listed above:  Primary prevention stressed with the patient.  Importance of compliance with diet and medication stressed and she vocalized understanding.  She was advised to walk at least half an hour a day 5 days a week. Essential hypertension: Her blood pressure is borderline and she has symptoms of orthostatic hypotension at times.  I will cut down the medication to 5 mg daily of amlodipine.  She will keep a track of her blood pressures and get back to [place]. Mixed dyslipidemia: On statin therapy and managed by primary care.  Diet emphasized. Diabetes mellitus and obesity: I reviewed findings with her at extensive length and weight reduction was stressed and exercise recommended. Patient wonders whether she needs potassium supplementation.  I will do a Chem-7 and get back to her about the answer to this question. Patient will be seen in follow-up appointment in 9 months or earlier if the patient has any concerns    Medication Adjustments/Labs and Tests Ordered: Current medicines are reviewed at length with the patient today.  Concerns regarding medicines are outlined above.  Orders Placed This Encounter  Procedures   Basic metabolic panel   Meds ordered this encounter  Medications   amLODipine (NORVASC) 5 MG tablet    Sig: Take 1 tablet (5 mg total) by mouth daily.    Dispense:  90 tablet    Refill:  3     No chief complaint on file.    History of Present Illness:    Krista [name] is a 51 y.o. female.  Patient has past medical history of  essential hypertension, mixed dyslipidemia and diabetes mellitus.  She denies any problems at this time and takes care of activities of daily living.  No chest pain orthopnea or PND.  She does admit that she walks on a regular basis.  At the time of my evaluation, the patient is alert awake oriented and in no distress.  She mentions to me that she occasionally has lightheadedness with change of posture.  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   GERD (gastroesophageal reflux disease)    History of palpitations yrs ago   PVCs - wore holter monitor, negative results   HTN (hypertension) 11/22/2012   Hypertension    Morbid obesity ([place]) 11/22/2012   OSA on CPAP 11/22/2012   Palpitations    Restless leg syndrome    patient denies this dx   Seasonal allergies    Status post laparoscopic hysterectomy 07/21/2015   STD (sexually  transmitted disease)    hx HPV   SVD (spontaneous vaginal delivery)    x 1   Vulvar cyst     Past Surgical History:  Procedure Laterality Date   CERVICAL BIOPSY  W/ LOOP ELECTRODE EXCISION  2006   colonscopy  09/2019   polyps return in 5 years   COLPOSCOPY  2006   CYSTOSCOPY N/A 07/21/2015   Procedure: CYSTOSCOPY;  Surgeon: [name], MD;  Location: [facility];  Service: Gynecology;  Laterality: N/A;   ESOPHAGOGASTRODUODENOSCOPY N/A 03/25/2021   Procedure: ESOPHAGOGASTRODUODENOSCOPY (EGD);  Surgeon: Milus Banister, MD;  Location: [facility];  Service: Endoscopy;  Laterality: N/A;   ESOPHAGOGASTRODUODENOSCOPY (EGD) WITH PROPOFOL N/A 12/12/2019    Procedure: ESOPHAGOGASTRODUODENOSCOPY (EGD) WITH PROPOFOL;  Surgeon: Milus Banister, MD;  Location: [facility];  Service: Endoscopy;  Laterality: N/A;   EUS N/A 12/12/2019   Procedure: UPPER ENDOSCOPIC ULTRASOUND (EUS) LINEAR;  Surgeon: Milus Banister, MD;  Location: [facility];  Service: Endoscopy;  Laterality: N/A;   EUS N/A 03/25/2021   Procedure: UPPER ENDOSCOPIC ULTRASOUND (EUS) RADIAL;  Surgeon: Milus Banister, MD;  Location: [facility];  Service: Endoscopy;  Laterality: N/A;   FINE NEEDLE ASPIRATION N/A 12/12/2019   Procedure: FINE NEEDLE ASPIRATION (FNA) LINEAR;  Surgeon: Milus Banister, MD;  Location: [facility];  Service: Endoscopy;  Laterality: N/A;   LESION REMOVAL Left 11/11/2019   Procedure: EXCISION OF LEFT VULVAR CYST;  Surgeon: [name], MD;  Location: [facility];  Service: Gynecology;  Laterality: Left;   ROBOTIC ASSISTED TOTAL HYSTERECTOMY WITH SALPINGECTOMY Bilateral 07/21/2015   Procedure: ROBOTIC ASSISTED TOTAL HYSTERECTOMY WITH SALPINGECTOMY;  Surgeon: [name], MD;  Location: [facility];  Service: Gynecology;  Laterality: Bilateral;   UPPER GI ENDOSCOPY  09/2019   WISDOM TOOTH EXTRACTION  1990's    Current Medications: Current Meds  Medication Sig   acetaminophen (TYLENOL) 500 MG tablet Take 1,000 mg by mouth every 8 (eight) hours as needed for mild pain.   albuterol (VENTOLIN HFA) 108 (90 Base) MCG/ACT inhaler Inhale 2 puffs into the lungs every 4 (four) hours as needed for shortness of breath.   baclofen (LIORESAL) 10 MG tablet Take 10 mg by mouth daily as needed for muscle spasms.    budesonide-formoterol (SYMBICORT) 160-4.5 MCG/ACT inhaler Inhale 2 puffs into the lungs 2 (two) times daily as needed for shortness of breath.   Dulaglutide (TRULICITY) 1.5 WV/3.7TG SOPN Inject 1.5 mg into the skin every Saturday.   DULoxetine (CYMBALTA) 60 MG capsule Take 60 mg by mouth daily.    estradiol (VIVELLE-DOT) 0.05  MG/24HR patch Place 1 patch (0.05 mg total) onto the skin 2 (two) times a week.   fexofenadine (ALLEGRA) 180 MG tablet Take 180 mg by mouth daily.    fluticasone (FLONASE) 50 MCG/ACT nasal spray Place 2 sprays into both nostrils daily.    GLUCOSAMINE CHONDROITIN COMPLX PO Take 1 capsule by mouth 2 (two) times daily.   JANUVIA 100 MG tablet Take 100 mg by mouth daily.   losartan-hydrochlorothiazide (HYZAAR) 100-25 MG tablet Take 1 tablet by mouth daily.    metFORMIN (GLUCOPHAGE) 500 MG tablet Take 500 mg by mouth 2 (two) times daily.   montelukast (SINGULAIR) 10 MG tablet Take 10 mg by mouth at bedtime.    Multiple Vitamin (MULTIVITAMIN) capsule Take 1 capsule by mouth daily.   nitroGLYCERIN (NITROSTAT) 0.4 MG SL tablet Place 0.4 mg under the tongue as needed  for chest pain.   potassium chloride (KLOR-CON) 10 MEQ tablet Take 10 mEq by mouth daily.   propranolol ER (INDERAL LA) 160 MG SR capsule Take 160 mg by mouth daily.   rosuvastatin (CRESTOR) 5 MG tablet Take 5 mg by mouth at bedtime.    Vitamin D, Ergocalciferol, (DRISDOL) 50000 UNITS CAPS capsule Take 50,000 Units by mouth every Thursday.    [DISCONTINUED] amLODipine (NORVASC) 10 MG tablet Take 10 mg by mouth daily.     Allergies:   Ace inhibitors, Elemental sulfur, and Penicillins   Social History   Socioeconomic History   Marital status: Married    Spouse name: Not on file   Number of children: 1   Years of education: 14   Highest education level: Not on file  Occupational History   Occupation: Environmental consultant - [employer]    Employer: [employer]  Tobacco Use   Smoking status: Never   Smokeless tobacco: Never  Scientific laboratory technician Use: Never used  Substance and Sexual Activity   Alcohol use: Yes    Comment: 1 drink/month   Drug use: No   Sexual activity: Yes    Partners: Male    Birth control/protection: Surgical    Comment: R-TLH/Bil.salingectomy  Other Topics Concern   Not on file  Social History Narrative    Not on file   Social Determinants of Health   Financial Resource Strain: Not on file  Food Insecurity: Not on file  Transportation Needs: Not on file  Physical Activity: Not on file  Stress: Not on file  Social Connections: Not on file     Family History: The patient's family history includes Alzheimer's disease in her paternal grandfather; Breast cancer in her cousin and paternal aunt; Cancer in her maternal aunt; Diabetes in her father and paternal grandmother; Hypertension in her father, maternal grandmother, mother, and paternal grandmother; Ovarian cancer in her maternal grandmother; Stroke in her maternal grandfather.  ROS:   Please see the history of present illness.    All other systems reviewed and are negative.  EKGs/Labs/Other Studies Reviewed:    The following studies were reviewed today: EKG reveals sinus rhythm and nonspecific ST-T changes   Recent Labs: No results found for requested labs within last 8760 hours.  Recent Lipid Panel No results found for: CHOL, TRIG, HDL, CHOLHDL, VLDL, LDLCALC, LDLDIRECT  Physical Exam:    VS:  BP 98/60    Pulse 88    Ht 5' 5.6" (1.666 m)    Wt 219 lb 9.6 oz (99.6 kg)    LMP 07/02/2015 (Approximate)    SpO2 99%    BMI 35.88 kg/m     Wt Readings from Last 3 Encounters:  04/05/21 219 lb 9.6 oz (99.6 kg)  03/25/21 220 lb (99.8 kg)  10/20/20 239 lb (108.4 kg)     GEN: Patient is in no acute distress HEENT: Normal NECK: No JVD; No carotid bruits LYMPHATICS: No lymphadenopathy CARDIAC: Hear sounds regular, 2/6 systolic murmur at the apex. RESPIRATORY:  Clear to auscultation without rales, wheezing or rhonchi  ABDOMEN: Soft, non-tender, non-distended MUSCULOSKELETAL:  No edema; No deformity  SKIN: Warm and dry NEUROLOGIC:  Alert and oriented x 3 PSYCHIATRIC:  Normal affect   Signed, Jenean Lindau, MD  04/05/2021 4:35 PM    [facility]

## 2021-04-06 LAB — BASIC METABOLIC PANEL
BUN/Creatinine Ratio: 11 (ref 9–23)
BUN: 9 mg/dL (ref 6–24)
CO2: 22 mmol/L (ref 20–29)
Calcium: 9.6 mg/dL (ref 8.7–10.2)
Chloride: 99 mmol/L (ref 96–106)
Creatinine, Ser: 0.79 mg/dL (ref 0.57–1.00)
Glucose: 151 mg/dL — ABNORMAL HIGH (ref 70–99)
Potassium: 3.9 mmol/L (ref 3.5–5.2)
Sodium: 137 mmol/L (ref 134–144)
eGFR: 91 mL/min/{1.73_m2} (ref >59)

## 2021-06-14 ENCOUNTER — Other Ambulatory Visit: Payer: Self-pay | Admitting: Obstetrics and Gynecology

## 2021-07-02 ENCOUNTER — Inpatient Hospital Stay: Payer: BC Managed Care – PPO | Attending: Oncology

## 2021-07-02 ENCOUNTER — Inpatient Hospital Stay (INDEPENDENT_AMBULATORY_CARE_PROVIDER_SITE_OTHER): Payer: BC Managed Care – PPO | Admitting: Oncology

## 2021-07-02 ENCOUNTER — Encounter: Payer: Self-pay | Admitting: Oncology

## 2021-07-02 ENCOUNTER — Ambulatory Visit: Payer: BC Managed Care – PPO | Admitting: Oncology

## 2021-07-02 ENCOUNTER — Other Ambulatory Visit: Payer: Self-pay | Admitting: Oncology

## 2021-07-02 VITALS — BP 125/75 | HR 77 | Temp 98.0°F | Resp 16 | Ht 65.6 in | Wt 217.6 lb

## 2021-07-02 DIAGNOSIS — C49A0 Gastrointestinal stromal tumor, unspecified site: Secondary | ICD-10-CM | POA: Insufficient documentation

## 2021-07-02 DIAGNOSIS — C49A2 Gastrointestinal stromal tumor of stomach: Secondary | ICD-10-CM | POA: Diagnosis not present

## 2021-07-02 LAB — BASIC METABOLIC PANEL
BUN: 14 (ref 4–21)
CO2: 29 — AB (ref 13–22)
Chloride: 102 (ref 99–108)
Creatinine: 0.8 (ref 0.5–1.1)
Glucose: 102
Potassium: 3 mEq/L — AB (ref 3.5–5.1)
Sodium: 141 (ref 137–147)

## 2021-07-02 LAB — HEPATIC FUNCTION PANEL
ALT: 28 U/L (ref 7–35)
AST: 28 (ref 13–35)
Alkaline Phosphatase: 106 (ref 25–125)
Bilirubin, Total: 0.8

## 2021-07-02 LAB — CBC: RBC: 4.42 (ref 3.87–5.11)

## 2021-07-02 LAB — CBC AND DIFFERENTIAL
HCT: 35 — AB (ref 36–46)
Hemoglobin: 11 — AB (ref 12.0–16.0)
Neutrophils Absolute: 5.09
Platelets: 342 10*3/uL (ref 150–400)
WBC: 7.6

## 2021-07-02 LAB — COMPREHENSIVE METABOLIC PANEL
Albumin: 4.1 (ref 3.5–5.0)
Calcium: 9.2 (ref 8.7–10.7)

## 2021-07-02 NOTE — Progress Notes (Signed)
[name]  [address] [place],  [place]  [address] [phone]  Clinic Day: 07/02/2021  Referring physician: [name], MD   HISTORY OF PRESENT ILLNESS:  The patient is a 51 y.o. Krista [name] who I was asked to consult upon for newly diagnosed GIST.  Her history dates back to 2021 when a routine GI evaluation revealed a subepithelial lesion in her stomach.  This led to an endoscopic ultrasound being done in November 2021 which showed a 1.7 x 1.5 cm lesion in her stomach.  A biopsy of this lesion showed spindle cells and CD117 positivity, consistent with a gastrointestinal stromal tumor.  The recommendation was for this lesion to be followed over time.  Recently, a repeat endoscopic ultrasound showed that this lesion had increased to 2 cm.  Although the patient was not having any GI symptoms, the growth of this lesion concerned her enough to where she wanted to have this lesion resected.  The patient underwent a partial gastrectomy in May 2023, whose pathology revealed a 2 cm gastrointestinal stromal tumor, spindle cell subtype.  Only 1 mitosis was seen per high powered field, consistent with low grade disease.  The patient comes in today to go over her surgical pathology and its implications.  Over these past few months she has had mild weight loss and a decreased appetite.  However, she attributes these findings to her being on diabetic medication.  She denies having any dark, tarry stools.  She also denies having nausea, vomiting, or dysphagia.  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   GERD (gastroesophageal reflux disease)    GIST (gastrointestinal stromal tumor), malignant (HCC)    History of palpitations yrs ago   PVCs - wore holter monitor, negative results   HTN (hypertension) 11/22/2012   Hypertension    Morbid obesity ([place]) 11/22/2012   OSA on CPAP 11/22/2012   Palpitations    Restless leg syndrome    patient denies this dx   Seasonal allergies    Status post laparoscopic hysterectomy 07/21/2015   STD (sexually transmitted disease)    hx HPV   Stomach cancer ([name])    SVD (spontaneous vaginal delivery)    x 1   Vulvar cyst     PAST SURGICAL HISTORY:   Past Surgical History:  Procedure Laterality Date   CERVICAL BIOPSY  W/ LOOP ELECTRODE EXCISION  2006   colonscopy  09/2019   polyps return in 5 years   COLPOSCOPY  2006   CYSTOSCOPY N/A 07/21/2015   Procedure: CYSTOSCOPY;  Surgeon: [name], MD;  Location: [facility];  Service: Gynecology;  Laterality: N/A;   ESOPHAGOGASTRODUODENOSCOPY N/A 03/25/2021   Procedure: ESOPHAGOGASTRODUODENOSCOPY (EGD);  Surgeon: Milus Banister, MD;  Location: [facility];  Service: Endoscopy;  Laterality: N/A;   ESOPHAGOGASTRODUODENOSCOPY (EGD) WITH PROPOFOL N/A 12/12/2019   Procedure: ESOPHAGOGASTRODUODENOSCOPY (EGD) WITH PROPOFOL;  Surgeon: Milus Banister, MD;  Location: [facility];  Service: Endoscopy;  Laterality: N/A;   EUS N/A 12/12/2019   Procedure: UPPER ENDOSCOPIC ULTRASOUND (EUS) LINEAR;  Surgeon: Milus Banister, MD;  Location: [facility];  Service: Endoscopy;  Laterality: N/A;   EUS N/A 03/25/2021   Procedure: UPPER ENDOSCOPIC ULTRASOUND (EUS) RADIAL;  Surgeon: Milus Banister, MD;  Location: [facility];  Service: Endoscopy;  Laterality: N/A;   FINE NEEDLE ASPIRATION N/A 12/12/2019   Procedure: FINE NEEDLE ASPIRATION (FNA) LINEAR;  Surgeon: Milus Banister, MD;  Location: [facility];  Service: Endoscopy;  Laterality: N/A;    LESION REMOVAL Left 11/11/2019   Procedure: EXCISION OF LEFT VULVAR CYST;  Surgeon: [name], MD;  Location: [facility];  Service: Gynecology;  Laterality: Left;   ROBOTIC ASSISTED TOTAL HYSTERECTOMY WITH SALPINGECTOMY Bilateral 07/21/2015   Procedure: ROBOTIC ASSISTED TOTAL HYSTERECTOMY WITH SALPINGECTOMY;  Surgeon: [name], MD;  Location: [facility];  Service: Gynecology;  Laterality: Bilateral;   UPPER GI ENDOSCOPY  09/2019   WISDOM TOOTH EXTRACTION  1990's    CURRENT MEDICATIONS:   Current Outpatient Medications  Medication Sig Dispense Refill   acetaminophen (TYLENOL) 500 MG tablet Take 1,000 mg by mouth every 8 (eight) hours as needed for mild pain.     albuterol (VENTOLIN HFA) 108 (90 Base) MCG/ACT inhaler Inhale 2 puffs into the lungs every 4 (four) hours as needed for shortness of breath.     amLODipine (NORVASC) 5 MG tablet Take 1 tablet (5 mg total) by mouth daily. 90 tablet 3   baclofen (LIORESAL) 10 MG tablet Take 10 mg by mouth daily as needed for muscle spasms.      budesonide-formoterol (SYMBICORT) 160-4.5 MCG/ACT inhaler Inhale 2 puffs into the lungs 2 (two) times daily as needed for shortness of breath.     DITROPAN XL 10 MG 24 hr tablet      Dulaglutide (TRULICITY) 1.5 MW/1.0UV SOPN Inject 1.5 mg into the skin every Saturday.     DULoxetine (CYMBALTA) 60 MG capsule Take 60 mg by mouth daily.      fexofenadine (ALLEGRA) 180 MG tablet Take 180 mg by mouth daily.      fluconazole (DIFLUCAN) 150 MG tablet Take 150 mg by mouth once a week.     fluticasone (FLONASE) 50 MCG/ACT nasal spray Place 2 sprays into both nostrils daily.      GLUCOSAMINE CHONDROITIN COMPLX PO Take 1 capsule by mouth 2 (two) times daily.     JANUVIA 100 MG tablet Take 100 mg by mouth daily.     losartan-hydrochlorothiazide (HYZAAR) 100-25 MG tablet Take 1 tablet by mouth daily.      montelukast (SINGULAIR) 10 MG tablet Take 10 mg by mouth at bedtime.       Multiple Vitamin (MULTIVITAMIN) capsule Take 1 capsule by mouth daily.     nitroGLYCERIN (NITROSTAT) 0.4 MG SL tablet Place 0.4 mg under the tongue as needed for chest pain.     potassium chloride (KLOR-CON) 10 MEQ tablet Take 10 mEq by mouth daily.     potassium chloride (MICRO-K) 10 MEQ CR capsule      propranolol ER (INDERAL LA) 160 MG SR capsule Take 160 mg by mouth daily.     PROTONIX 40 MG tablet      rosuvastatin (CRESTOR) 5 MG tablet Take 5 mg by mouth at bedtime.      Vitamin D, Ergocalciferol, (DRISDOL) 50000 UNITS CAPS capsule Take 50,000 Units by mouth every Thursday.      No current  facility-administered medications for this visit.    ALLERGIES:   Allergies  Allergen Reactions   Ace Inhibitors Cough   Elemental Sulfur Itching and Rash    CAN WE CLARIFY WHAT MEDICATION IT WAS?   Penicillins Itching, Swelling and Rash    Has patient had a PCN reaction causing immediate rash, facial/tongue/throat swelling, SOB or lightheadedness with hypotension: Yes Has patient had a PCN reaction causing severe rash involving mucus membranes or skin necrosis: Yes Has patient had a PCN reaction that required hospitalization No Has patient had a PCN reaction occurring within the last 10 years: No If all of the above answers are "NO", then may proceed with Cephalosporin use.     FAMILY HISTORY:   Family History  Problem Relation Age of Onset   Hypertension Mother    Diabetes Father    Hypertension Father    Cancer Maternal Aunt        dec bladder ca   Breast cancer Paternal Aunt    Ovarian cancer Maternal Grandmother    Hypertension Maternal Grandmother    Stroke Maternal Grandfather    Diabetes Paternal Grandmother    Hypertension Paternal Grandmother    Alzheimer's disease Paternal Grandfather    Breast cancer Cousin    SOCIAL HISTORY:  The patient was born and raised in [place].  She lives in [place] with her husband of 26 years.  They have 1 child.  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  There is no history of alcoholism or tobacco abuse.  REVIEW OF SYSTEMS:  Review of Systems  Constitutional:  Negative for fatigue and fever.  HENT:   Negative for hearing loss and sore throat.   Eyes:  Negative for eye problems.  Respiratory:  Negative for chest tightness, cough and hemoptysis.   Cardiovascular:  Negative for chest pain and palpitations.  Gastrointestinal:  Positive for constipation. Negative for abdominal distention, abdominal pain, blood in stool, diarrhea, nausea and vomiting.  Endocrine: Negative for hot flashes.  Genitourinary:  Negative for difficulty urinating, dysuria, frequency, hematuria and nocturia.   Musculoskeletal:  Positive for back pain. Negative for arthralgias, gait problem and myalgias.  Skin: Negative.  Negative for itching and rash.  Neurological: Negative.  Negative for dizziness, extremity weakness, gait problem, headaches, light-headedness and numbness.  Hematological: Negative.   Psychiatric/Behavioral:  Negative for depression and suicidal ideas. The patient is nervous/anxious.     PHYSICAL EXAM:  Blood pressure 125/75, pulse 77, temperature 98 F (36.7 C), resp. rate 16, height 5' 5.6" (1.666 m), weight 217 lb 9.6 oz (98.7 kg), last menstrual period 06/30/2015, SpO2 98 %. Wt Readings from Last 3 Encounters:  07/02/21 217 lb 9.6 oz (98.7 kg)  04/05/21 219 lb 9.6 oz (99.6 kg)  03/25/21 220 lb (99.8 kg)   Body mass index is 35.55 kg/m. Performance status (ECOG): 0 - Asymptomatic Physical Exam Constitutional:      Appearance: Normal appearance. She is not ill-appearing.  HENT:     Mouth/Throat:     Mouth: Mucous membranes are moist.     Pharynx: Oropharynx is clear. No oropharyngeal exudate or posterior oropharyngeal erythema.  Cardiovascular:     Rate and Rhythm: Normal rate and regular rhythm.     Heart sounds: No murmur heard.    No friction rub. No gallop.  Pulmonary:     Effort: Pulmonary effort  is normal. No respiratory distress.     Breath sounds: Normal breath sounds. No wheezing, rhonchi or  rales.  Abdominal:     General: Bowel sounds are normal. There is no distension.     Palpations: Abdomen is soft. There is no mass.     Tenderness: There is no abdominal tenderness.  Musculoskeletal:        General: No swelling.     Right lower leg: No edema.     Left lower leg: No edema.  Lymphadenopathy:     Cervical: No cervical adenopathy.     Upper Body:     Right upper body: No supraclavicular or axillary adenopathy.     Left upper body: No supraclavicular or axillary adenopathy.     Lower Body: No right inguinal adenopathy. No left inguinal adenopathy.  Skin:    General: Skin is warm.     Coloration: Skin is not jaundiced.     Findings: No lesion or rash.  Neurological:     General: No focal deficit present.     Mental Status: She is alert and oriented to person, place, and time. Mental status is at baseline.  Psychiatric:        Mood and Affect: Mood normal.        Behavior: Behavior normal.        Thought Content: Thought content normal.    LABS:      Latest Ref Rng & Units 07/02/2021   12:00 AM 11/07/2019    4:24 PM 09/03/2015    2:03 PM  CBC  WBC  7.6     7.8  7.7   Hemoglobin 12.0 - 16.0 11.0     10.9  12.0   Hematocrit 36 - 46 35     34.4  37.5   Platelets 150 - 400 K/uL 342     301  332      This result is from an external source.      Latest Ref Rng & Units 07/02/2021   12:00 AM 04/05/2021    4:37 PM 11/07/2019    4:24 PM  CMP  Glucose 70 - 99 mg/dL  151  80   BUN 4 - '21 14     9  11   ' Creatinine 0.5 - 1.1 0.8     0.79  0.65   Sodium 137 - 147 141     137  142   Potassium 3.5 - 5.1 mEq/L 3.0     3.9  3.3   Chloride 99 - 108 102     99  100   CO2 13 - '22 29     22  29   ' Calcium 8.7 - 10.7 9.2     9.6  9.3   Alkaline Phos 25 - 125 106        AST 13 - 35 28        ALT 7 - 35 U/L 28           This result is from an external source.   ASSESSMENT & PLAN:   A 51 y.o. 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  In clinic today, I went over all of her surgical pathology with her.  Based upon the small size of this tumor and its very low mitotic activity, I do not believe there is any role for adjuvant imatinib therapy.  Moving forward, her surveillance plan will consist of her undergoing physical exams and CT scans every 6 months for this first year after  surgery.  Afterwards, her scans will be spaced out to yearly.  Her next CT scans will be done in November 2023.  I will see her back a day later to go over her scans, as well as to reassess her per physical exam.  The patient understands all the plans discussed today and is in agreement with them.  I do appreciate [name], MD for his new consult.   Kc Sedlak Macarthur Critchley, MD

## 2021-09-28 ENCOUNTER — Encounter: Payer: Self-pay | Admitting: Podiatrist

## 2021-09-28 ENCOUNTER — Ambulatory Visit: Payer: BC Managed Care – PPO | Admitting: Podiatrist

## 2021-09-28 ENCOUNTER — Ambulatory Visit (INDEPENDENT_AMBULATORY_CARE_PROVIDER_SITE_OTHER): Payer: BC Managed Care – PPO

## 2021-09-28 DIAGNOSIS — M792 Neuralgia and neuritis, unspecified: Secondary | ICD-10-CM | POA: Diagnosis not present

## 2021-09-28 DIAGNOSIS — M79672 Pain in left foot: Secondary | ICD-10-CM

## 2021-09-28 DIAGNOSIS — E1169 Type 2 diabetes mellitus with other specified complication: Secondary | ICD-10-CM | POA: Diagnosis not present

## 2021-09-28 DIAGNOSIS — E1142 Type 2 diabetes mellitus with diabetic polyneuropathy: Secondary | ICD-10-CM | POA: Diagnosis not present

## 2021-09-28 DIAGNOSIS — M779 Enthesopathy, unspecified: Secondary | ICD-10-CM

## 2021-09-28 DIAGNOSIS — M21612 Bunion of left foot: Secondary | ICD-10-CM | POA: Diagnosis not present

## 2021-09-28 DIAGNOSIS — B351 Tinea unguium: Secondary | ICD-10-CM | POA: Diagnosis not present

## 2021-09-28 NOTE — Patient Instructions (Signed)
Try Capsacian Cream for the burning nerve pain on the top of your left foot.    Try the pads and move them around to find the one that is the most comfortable.

## 2021-09-28 NOTE — Progress Notes (Unsigned)
Chief Complaint  Patient presents with   Bunions    Patient came in today with bunion and ball of the foot pain, which started 3 mths ago, pt is having a burning pain, patient rates her pain 8 out of 10, TX; changed shoes, soaking X-Rays were done today.      HPI: Patient is 51 y.o. female who presents today for a new problem of pain sub 2 left foot she also has a feeling of numbness on the dorsal lateral aspect of the left foot and she would like her nails trimmed.  She relates that the pain near the base of the second toe started about 3 months ago and it occurs when she is on her feet for 8 hours or more.  She is tried soaks and changing shoes with some relief in symptoms.  She is diabetic her last blood sugar was 98 yesterday. 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   Mixed dyslipidemia 04/05/2021   Diabetes mellitus due to underlying condition with unspecified complications ([place]) 14/48/1856   Palpitations 09/28/2020   Chest pain 09/28/2020   Anxiety 09/25/2020   Arthritis 09/25/2020   DM type 2 (diabetes mellitus, type 2) ([place]) 09/25/2020   Fibroid 09/25/2020   GERD (gastroesophageal reflux disease) 09/25/2020   History of palpitations 09/25/2020   Hypertension 09/25/2020   Seasonal allergies 09/25/2020   STD (sexually transmitted disease) 09/25/2020   SVD (spontaneous vaginal delivery) 09/25/2020   Vulvar cyst 09/25/2020   Status post laparoscopic hysterectomy 07/21/2015   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     albuterol (VENTOLIN HFA) 108 (90 Base) MCG/ACT inhaler Inhale 2 puffs into the lungs every 4 (four) hours as needed for shortness of breath.     amLODipine (NORVASC) 5 MG tablet Take 1 tablet (5 mg total) by mouth daily. 90 tablet 3   baclofen (LIORESAL) 10 MG tablet Take 10 mg by mouth daily as needed for muscle spasms.      budesonide-formoterol (SYMBICORT) 160-4.5 MCG/ACT inhaler Inhale 2 puffs into the lungs 2 (two) times daily as needed for shortness of breath.     DITROPAN XL 10 MG 24 hr tablet      Dulaglutide (TRULICITY) 1.5 DJ/4.9FW SOPN Inject 1.5 mg into the skin every Saturday.     DULoxetine (CYMBALTA) 60 MG capsule Take 60 mg by mouth daily.      fexofenadine (ALLEGRA) 180 MG tablet Take 180 mg by mouth daily.      fluconazole (DIFLUCAN) 150 MG tablet Take 150 mg by mouth once a week.     fluticasone (FLONASE) 50 MCG/ACT nasal spray Place 2 sprays into both nostrils daily.      GLUCOSAMINE CHONDROITIN COMPLX PO Take 1 capsule by mouth 2 (two) times daily.     JANUVIA 100 MG tablet Take 100 mg by mouth daily.     losartan-hydrochlorothiazide (HYZAAR) 100-25 MG tablet Take 1 tablet by mouth daily.      montelukast (SINGULAIR) 10 MG  tablet Take 10 mg by mouth at bedtime.      Multiple Vitamin (MULTIVITAMIN) capsule Take 1 capsule by mouth daily.     nitroGLYCERIN (NITROSTAT) 0.4 MG SL tablet Place 0.4 mg under the tongue as needed for chest pain.     potassium chloride (KLOR-CON) 10 MEQ tablet Take 10 mEq by mouth daily.     potassium chloride (MICRO-K) 10 MEQ CR capsule      propranolol ER (INDERAL LA) 160 MG SR capsule Take 160 mg by mouth daily.     PROTONIX 40 MG tablet      rosuvastatin (CRESTOR) 5 MG tablet Take 5 mg by mouth at bedtime.      Vitamin D, Ergocalciferol, (DRISDOL) 50000 UNITS CAPS capsule Take 50,000 Units by mouth every Thursday.      No current facility-administered medications on file prior to visit.    Allergies  Allergen  Reactions   Ace Inhibitors Cough   Elemental Sulfur Itching and Rash    CAN WE CLARIFY WHAT MEDICATION IT WAS?   Penicillins Itching, Swelling and Rash    Has patient had a PCN reaction causing immediate rash, facial/tongue/throat swelling, SOB or lightheadedness with hypotension: Yes Has patient had a PCN reaction causing severe rash involving mucus membranes or skin necrosis: Yes Has patient had a PCN reaction that required hospitalization No Has patient had a PCN reaction occurring within the last 10 years: No If all of the above answers are "NO", then may proceed with Cephalosporin use.     Review of Systems No fevers, chills, nausea, muscle aches, no difficulty breathing, no calf pain, no chest pain or shortness of breath.   Physical Exam  GENERAL APPEARANCE: Alert, conversant. Appropriately groomed. No acute distress.   VASCULAR: Pedal pulses palpable 1/4 DP and 1/4 PT bilateral.  Capillary refill time is immediate to all digits,  Proximal to distal cooling it warm to warm.  Digital perfusion adequate.   NEUROLOGIC: sensation is intact to 5.07 monofilament at 2/5 sites bilateral.  Light touch is intact bilateral, vibratory sensation intact bilateral.  Subjective symptoms of neuritis are noted dorsal lateral aspect of the left foot.  Likely due to shoe gear or positional pressure on the foot.  MUSCULOSKELETAL: acceptable muscle strength, tone and stability bilateral.  No gross boney pedal deformities noted.  Pain submetatarsal 2 of the left foot is noted.  Negative Lachman's test is noted.  Contracture hammertoe 2 is noted.  She also has a small bunion deformity and pes planus deformity noted left foot.  DERMATOLOGIC: skin is warm, supple, and dry.  Color, texture, and turgor of skin within normal limits.  No open wounds are noted.  No preulcerative lesions are seen.  Digital nails are elongated, friable brittle, thickened and painful with dorsal plantar pressure 1 through 5  bilateral.  X-ray evaluation 3 views of the left foot are obtained.  Hammertoe deformity 2 through 5 is noted.  Bunion deformity is seen pes planus deformity is noted.  No acute osseous abnormalities are noted.  No joint abnormalities are noted.    Assessment     ICD-10-CM   1. Neuritis  M79.2     2. Bunion, left  M21.612     3. Capsulitis  M77.9 DG Foot Complete Left    4. 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  Recommended capsaicin cream to put on the top of the left foot to help with the discomfort.  Also recommended offloading pads to take the pressure off the submetatarsal 2 left foot.  Discussed that if these are beneficial we can get it custom orthotic device however would like for her to try these first to see if they are beneficial.  Felt pads dispensed for her use and she was shown how to wear them.  Also trim the toenails in thickness and length via sterile nail nipper and power bur.  She will return in 3 months for continued at risk foot care if any concerns arise prior to that visit she will call.

## 2021-11-24 ENCOUNTER — Telehealth: Payer: Self-pay | Admitting: Oncology

## 2021-11-24 NOTE — Telephone Encounter (Signed)
CT Abd/Pel is scheduled for 12/08/21; Checking in @ 8 for a 9:00 appt   Contacted pt to notify her of appt information; unable to reach via phone.

## 2021-11-30 ENCOUNTER — Telehealth: Payer: Self-pay | Admitting: Oncology

## 2021-11-30 NOTE — Telephone Encounter (Signed)
Contacted pt to notify her of upcoming scan appt. Unable to reach pt via mobile number listed - vm box is full. Home number is not in service.

## 2021-12-06 ENCOUNTER — Telehealth: Payer: Self-pay | Admitting: Oncology

## 2021-12-06 NOTE — Telephone Encounter (Signed)
CT A/P has been scheduled for 12/08/21; Checking in @ 8 for a 9:00 appt   Notified pt of date,time and instructions.

## 2021-12-08 ENCOUNTER — Other Ambulatory Visit: Payer: Self-pay

## 2021-12-08 DIAGNOSIS — C49A2 Gastrointestinal stromal tumor of stomach: Secondary | ICD-10-CM

## 2021-12-08 NOTE — Progress Notes (Signed)
[facility]  [address] [place],  [place]  [address] [phone]  Clinic Day: 07/02/2021  Referring physician: [name], MD   HISTORY OF PRESENT ILLNESS:  The patient is a 51 y.o. female who I was asked to consult upon for newly diagnosed GIST.  Her history dates back to 2021 when a routine GI evaluation revealed a subepithelial lesion in her stomach.  This led to an endoscopic ultrasound being done in November 2021 which showed a 1.7 x 1.5 cm lesion in her stomach.  A biopsy of this lesion showed spindle cells and CD117 positivity, consistent with a gastrointestinal stromal tumor.  The recommendation was for this lesion to be followed over time.  Recently, a repeat endoscopic ultrasound showed that this lesion had increased to 2 cm.  Although the patient was not having any GI symptoms, the growth of this lesion concerned her enough to where she wanted to have this lesion resected.  The patient underwent a partial gastrectomy in May 2023, whose pathology revealed a 2 cm gastrointestinal stromal tumor, spindle cell subtype.  Only 1 mitosis was seen per high powered field, consistent with low grade disease.  The patient comes in today to go over her surgical pathology and its implications.  Over these past few months she has had mild weight loss and a decreased appetite.  However, she attributes these findings to her being on diabetic medication.  She denies having any dark, tarry stools.  She also denies having nausea, vomiting, or dysphagia.  PAST MEDICAL HISTORY:   Past Medical History:  Diagnosis Date  . Abnormal EKG 11/22/2012   TWI's, suggesting possible ischemia   . Abnormal Pap smear of cervix 2006   Leep, colpo  . Anxiety   . Arthritis   . Chest pain   . Diabetes mellitus due to underlying condition with unspecified complications ([place]) 70/48/8891  . DM type 2 (diabetes mellitus, type 2) ([place])    type 2  . DOE (dyspnea on exertion) 11/22/2012   . Fibroid   . 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 GERD (gastroesophageal reflux disease)   . GIST (gastrointestinal stromal tumor), malignant ([place])   . History of palpitations yrs ago   PVCs - wore holter monitor, negative results  . HTN (hypertension) 11/22/2012  . Hypertension   . Morbid obesity ([place]) 11/22/2012  . OSA on CPAP 11/22/2012  . Palpitations   . Restless leg syndrome    patient denies this dx  . Seasonal allergies   . 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 STD (sexually transmitted disease)    hx HPV  . Stomach cancer ([place])   . SVD (spontaneous vaginal delivery)    x 1  . Vulvar cyst     PAST SURGICAL HISTORY:   Past Surgical History:  Procedure Laterality Date  . CERVICAL BIOPSY  W/ LOOP ELECTRODE EXCISION  2006  . colonscopy  09/2019   polyps return in 5 years  . COLPOSCOPY  2006  . CYSTOSCOPY N/A 07/21/2015   Procedure: CYSTOSCOPY;  Surgeon: [name], MD;  Location: [facility];  Service: Gynecology;  Laterality: N/A;  . ESOPHAGOGASTRODUODENOSCOPY N/A 03/25/2021   Procedure: ESOPHAGOGASTRODUODENOSCOPY (EGD);  Surgeon: Milus Banister, MD;  Location: [facility];  Service: Endoscopy;  Laterality: N/A;  . ESOPHAGOGASTRODUODENOSCOPY (EGD) WITH PROPOFOL N/A 12/12/2019   Procedure: ESOPHAGOGASTRODUODENOSCOPY (EGD) WITH PROPOFOL;  Surgeon: Milus Banister, MD;  Location: [facility];  Service: Endoscopy;  Laterality: N/A;  . EUS N/A 12/12/2019   Procedure: UPPER ENDOSCOPIC ULTRASOUND (EUS) LINEAR;  Surgeon: Milus Banister, MD;  Location: [facility];  Service: Endoscopy;  Laterality: N/A;  . EUS N/A 03/25/2021   Procedure: UPPER ENDOSCOPIC ULTRASOUND (EUS) RADIAL;  Surgeon: Milus Banister, MD;  Location: [facility];  Service: Endoscopy;  Laterality: N/A;  . FINE NEEDLE ASPIRATION N/A 12/12/2019   Procedure: FINE NEEDLE ASPIRATION (FNA) LINEAR;  Surgeon: Milus Banister, MD;  Location: [facility];  Service:  Endoscopy;  Laterality: N/A;  . LESION REMOVAL Left 11/11/2019   Procedure: EXCISION OF LEFT VULVAR CYST;  Surgeon: [name], MD;  Location: [facility];  Service: Gynecology;  Laterality: Left;  . ROBOTIC ASSISTED TOTAL HYSTERECTOMY WITH SALPINGECTOMY Bilateral 07/21/2015   Procedure: ROBOTIC ASSISTED TOTAL HYSTERECTOMY WITH SALPINGECTOMY;  Surgeon: [name], MD;  Location: [facility];  Service: Gynecology;  Laterality: Bilateral;  . UPPER GI ENDOSCOPY  09/2019  . WISDOM TOOTH EXTRACTION  1990's    CURRENT MEDICATIONS:   Current Outpatient Medications  Medication Sig Dispense Refill  . acetaminophen (TYLENOL) 500 MG tablet Take 1,000 mg by mouth every 8 (eight) hours as needed for mild pain.    Marland Kitchen albuterol (VENTOLIN HFA) 108 (90 Base) MCG/ACT inhaler Inhale 2 puffs into the lungs every 4 (four) hours as needed for shortness of breath.    Marland Kitchen amLODipine (NORVASC) 5 MG tablet Take 1 tablet (5 mg total) by mouth daily. 90 tablet 3  . baclofen (LIORESAL) 10 MG tablet Take 10 mg by mouth daily as needed for muscle spasms.     . budesonide-formoterol (SYMBICORT) 160-4.5 MCG/ACT inhaler Inhale 2 puffs into the lungs 2 (two) times daily as needed for shortness of breath.    Marland Kitchen DITROPAN XL 10 MG 24 hr tablet     . Dulaglutide (TRULICITY) 1.5 FK/8.1EX SOPN Inject 1.5 mg into the skin every Saturday.    . DULoxetine (CYMBALTA) 60 MG capsule Take 60 mg by mouth daily.     . fexofenadine (ALLEGRA) 180 MG tablet Take 180 mg by mouth daily.     . fluconazole (DIFLUCAN) 150 MG tablet Take 150 mg by mouth once a week.    . fluticasone (FLONASE) 50 MCG/ACT nasal spray Place 2 sprays into both nostrils daily.     Marland Kitchen GLUCOSAMINE CHONDROITIN COMPLX PO Take 1 capsule by mouth 2 (two) times daily.    Marland Kitchen JANUVIA 100 MG tablet Take 100 mg by mouth daily.    Marland Kitchen losartan-hydrochlorothiazide (HYZAAR) 100-25 MG tablet Take 1 tablet by mouth daily.     . montelukast (SINGULAIR) 10  MG tablet Take 10 mg by mouth at bedtime.     . Multiple Vitamin (MULTIVITAMIN) capsule Take 1 capsule by mouth daily.    . nitroGLYCERIN (NITROSTAT) 0.4 MG SL tablet Place 0.4 mg under the tongue as needed for chest pain.    . potassium chloride (KLOR-CON) 10 MEQ tablet Take 10 mEq by mouth daily.    . potassium chloride (MICRO-K) 10 MEQ CR capsule     . propranolol ER (INDERAL LA) 160 MG SR capsule Take 160 mg by mouth daily.    Marland Kitchen PROTONIX 40 MG tablet     . rosuvastatin (CRESTOR) 5 MG tablet Take 5 mg by mouth at bedtime.     . Vitamin D, Ergocalciferol, (DRISDOL) 50000 UNITS CAPS capsule Take 50,000 Units by mouth every Thursday.      No current  facility-administered medications for this visit.    ALLERGIES:   Allergies  Allergen Reactions  . Ace Inhibitors Cough  . Elemental Sulfur Itching and Rash    CAN WE CLARIFY WHAT MEDICATION IT WAS?  Marland Kitchen Penicillins Itching, Swelling and Rash    Has patient had a PCN reaction causing immediate rash, facial/tongue/throat swelling, SOB or lightheadedness with hypotension: Yes Has patient had a PCN reaction causing severe rash involving mucus membranes or skin necrosis: Yes Has patient had a PCN reaction that required hospitalization No Has patient had a PCN reaction occurring within the last 10 years: No If all of the above answers are "NO", then may proceed with Cephalosporin use.     FAMILY HISTORY:   Family History  Problem Relation Age of Onset  . Hypertension Mother   . Diabetes Father   . Hypertension Father   . Cancer Maternal Aunt        dec bladder ca  . Breast cancer Paternal Aunt   . Ovarian cancer Maternal Grandmother   . Hypertension Maternal Grandmother   . Stroke Maternal Grandfather   . Diabetes Paternal Grandmother   . Hypertension Paternal Grandmother   . Alzheimer's disease Paternal Grandfather   . Breast cancer Cousin    SOCIAL HISTORY:  The patient was born and raised in [place].  She lives in  [place] with her husband of 26 years.  They have 1 child.  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  There is no history of alcoholism or tobacco abuse.  REVIEW OF SYSTEMS:  Review of Systems  Constitutional:  Negative for fatigue and fever.  HENT:   Negative for hearing loss and sore throat.   Eyes:  Negative for eye problems.  Respiratory:  Negative for chest tightness, cough and hemoptysis.   Cardiovascular:  Negative for chest pain and palpitations.  Gastrointestinal:  Positive for constipation. Negative for abdominal distention, abdominal pain, blood in stool, diarrhea, nausea and vomiting.  Endocrine: Negative for hot flashes.  Genitourinary:  Negative for difficulty urinating, dysuria, frequency, hematuria and nocturia.   Musculoskeletal:  Positive for back pain. Negative for arthralgias, gait problem and myalgias.  Skin: Negative.  Negative for itching and rash.  Neurological: Negative.  Negative for dizziness, extremity weakness, gait problem, headaches, light-headedness and numbness.  Hematological: Negative.   Psychiatric/Behavioral:  Negative for depression and suicidal ideas. The patient is nervous/anxious.     PHYSICAL EXAM:  Last menstrual period 06/30/2015. Wt Readings from Last 3 Encounters:  07/02/21 217 lb 9.6 oz (98.7 kg)  04/05/21 219 lb 9.6 oz (99.6 kg)  03/25/21 220 lb (99.8 kg)   There is no height or weight on file to calculate BMI. Performance status (ECOG): 0 - Asymptomatic Physical Exam Constitutional:      Appearance: Normal appearance. She is not ill-appearing.  HENT:     Mouth/Throat:     Mouth: Mucous membranes are moist.     Pharynx: Oropharynx is clear. No oropharyngeal exudate or posterior oropharyngeal erythema.  Cardiovascular:     Rate and Rhythm: Normal rate and regular rhythm.     Heart sounds: No murmur heard.    No friction rub. No gallop.  Pulmonary:     Effort: Pulmonary effort is normal. No respiratory distress.     Breath  sounds: Normal breath sounds. No wheezing, rhonchi or rales.  Abdominal:     General: Bowel sounds are normal. There is no distension.     Palpations: Abdomen is  soft. There is no mass.     Tenderness: There is no abdominal tenderness.  Musculoskeletal:        General: No swelling.     Right lower leg: No edema.     Left lower leg: No edema.  Lymphadenopathy:     Cervical: No cervical adenopathy.     Upper Body:     Right upper body: No supraclavicular or axillary adenopathy.     Left upper body: No supraclavicular or axillary adenopathy.     Lower Body: No right inguinal adenopathy. No left inguinal adenopathy.  Skin:    General: Skin is warm.     Coloration: Skin is not jaundiced.     Findings: No lesion or rash.  Neurological:     General: No focal deficit present.     Mental Status: She is alert and oriented to person, place, and time. Mental status is at baseline.  Psychiatric:        Mood and Affect: Mood normal.        Behavior: Behavior normal.        Thought Content: Thought content normal.   LABS:      Latest Ref Rng & Units 07/02/2021   12:00 AM 11/07/2019    4:24 PM 09/03/2015    2:03 PM  CBC  WBC  7.6     7.8  7.7   Hemoglobin 12.0 - 16.0 11.0     10.9  12.0   Hematocrit 36 - 46 35     34.4  37.5   Platelets 150 - 400 K/uL 342     301  332      This result is from an external source.       Latest Ref Rng & Units 07/02/2021   12:00 AM 04/05/2021    4:37 PM 11/07/2019    4:24 PM  CMP  Glucose 70 - 99 mg/dL  151  80   BUN 4 - _0 Creatinine 0.5 - 1.1 0.8     0.79  0.65   Sodium 137 - 147 141     137  142   Potassium 3.5 - 5.1 mEq/L 3.0     3.9  3.3   Chloride 99 - 108 102     99  100   CO2 13 - _1 Calcium 8.7 - 10.7 9.2     9.6  9.3   Alkaline Phos 25 - 125 106        AST 13 - 35 28        ALT 7 - 35 U/L 28           This result is from an external source.    ASSESSMENT & PLAN:  A 51 y.o. 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  In clinic today, I went over all of her surgical pathology with her.  Based upon the small size of this tumor and its very low mitotic activity, I do not believe there is any role for adjuvant imatinib therapy.  Moving forward, her surveillance plan will consist of her undergoing physical exams and CT scans every 6 months for this first year after surgery.  Afterwards, her scans will be spaced out to yearly.  Her next CT scans will be done in November 2023.  I will see her back a day later to go over her scans, as well as to reassess her per physical exam.  The patient understands all the plans discussed today and is in agreement with them.  I do appreciate [name], MD for his new consult.   Ellwood Steidle Macarthur Critchley, MD

## 2021-12-09 ENCOUNTER — Inpatient Hospital Stay: Payer: BC Managed Care – PPO | Attending: Oncology | Admitting: Oncology

## 2021-12-09 ENCOUNTER — Other Ambulatory Visit: Payer: Self-pay | Admitting: Oncology

## 2021-12-09 VITALS — BP 125/79 | HR 79 | Temp 98.7°F | Resp 16 | Ht 65.6 in | Wt 216.8 lb

## 2021-12-09 DIAGNOSIS — C49A2 Gastrointestinal stromal tumor of stomach: Secondary | ICD-10-CM | POA: Diagnosis not present

## 2022-02-02 ENCOUNTER — Other Ambulatory Visit: Payer: Self-pay

## 2022-02-02 DIAGNOSIS — C169 Malignant neoplasm of stomach, unspecified: Secondary | ICD-10-CM | POA: Insufficient documentation

## 2022-02-03 ENCOUNTER — Ambulatory Visit: Payer: BC Managed Care – PPO | Attending: Cardiology | Admitting: Cardiology

## 2022-02-07 ENCOUNTER — Encounter: Payer: Self-pay | Admitting: Cardiology

## 2022-02-09 ENCOUNTER — Encounter: Payer: Self-pay | Admitting: Cardiology

## 2022-02-09 ENCOUNTER — Ambulatory Visit: Payer: BC Managed Care – PPO | Attending: Cardiology | Admitting: Cardiology

## 2022-02-09 VITALS — BP 106/76 | HR 75 | Ht 64.0 in | Wt 222.2 lb

## 2022-02-09 DIAGNOSIS — E782 Mixed hyperlipidemia: Secondary | ICD-10-CM

## 2022-02-09 DIAGNOSIS — I10 Essential (primary) hypertension: Secondary | ICD-10-CM

## 2022-02-09 DIAGNOSIS — E088 Diabetes mellitus due to underlying condition with unspecified complications: Secondary | ICD-10-CM

## 2022-02-09 NOTE — Patient Instructions (Signed)
Medication Instructions:  Your physician recommends that you continue on your current medications as directed. Please refer to the Current Medication list given to you today.  *If you need a refill on your cardiac medications before your next appointment, please call your pharmacy*   Lab Work: None ordered If you have labs (blood work) drawn today and your tests are completely normal, you will receive your results only by: MyChart Message (if you have MyChart) OR A paper copy in the mail If you have any lab test that is abnormal or we need to change your treatment, we will call you to review the results.   Testing/Procedures: None ordered   Follow-Up: At  [facility], you and your health needs are our priority.  As part of our continuing mission to provide you with exceptional heart care, we have created designated Provider Care Teams.  These Care Teams include your primary Cardiologist (physician) and Advanced Practice Providers (APPs -  Physician Assistants and Nurse Practitioners) who all work together to provide you with the care you need, when you need it.  We recommend signing up for the patient portal called "MyChart".  Sign up information is provided on this After Visit Summary.  MyChart is used to connect with patients for Virtual Visits (Telemedicine).  Patients are able to view lab/test results, encounter notes, upcoming appointments, etc.  Non-urgent messages can be sent to your provider as well.   To learn more about what you can do with MyChart, go to [email or web address].    Your next appointment:   12 month(s)  The format for your next appointment:   In Person  Provider:   Rajan Revankar, MD    Other Instructions none  Important Information About Sugar      

## 2022-02-09 NOTE — Progress Notes (Signed)
Cardiology Office Note:    Date:  02/09/2022   ID:  [name], DOB February 09, 1970, MRN [id number]  PCP:  [name], MD  Cardiologist:  Jenean Lindau, MD   Referring MD: [name], MD    ASSESSMENT:    1. Primary hypertension   2. Diabetes mellitus due to underlying condition with unspecified complications ([place])   3. Morbid obesity ([place])   4. Mixed dyslipidemia    PLAN:    In order of problems listed above:  Primary prevention stressed with the patient.  Importance of compliance with diet medication stressed and she vocalized understanding. Essential hypertension: Blood pressure is stable and diet was emphasized.  Dietary issues were discussed. Mixed dyslipidemia: On lipid-lowering medications.  Lipids from [facility] sheet is reviewed and discussed with patient. Diabetes mellitus and obesity: Weight reduction stressed diet emphasized.  She plans to see her primary care in the next few days and get complete blood work and send me a copy.  I will review this. History of palpitations: Have resolved completely and she is happy about it.  She was advised to walk at least half an hour a day 5 days a week and she promises to do so. Patient will be seen in follow-up appointment in 12 months or earlier if the patient has any concerns    Medication Adjustments/Labs and Tests Ordered: Current medicines are reviewed at length with the patient today.  Concerns regarding medicines are outlined above.  No orders of the defined types were placed in this encounter.  No orders of the defined types were placed in this encounter.    No chief complaint on file.    History of Present Illness:    Krista [name] is a 52 y.o. female.  Patient has past medical history of essential hypertension, dyslipidemia, diabetes mellitus and obesity.  She denies any problems at this time and takes care of activities of daily living.  She does not exercise on a regular basis but works full-time.   At the time of my evaluation, the patient is alert awake oriented and in no distress.  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   GERD (gastroesophageal reflux disease)    GIST (gastrointestinal stromal tumor), malignant (HCC)    History of palpitations yrs ago   PVCs - wore holter monitor, negative results   HTN (hypertension) 11/22/2012   Hypertension    Mixed dyslipidemia 04/05/2021   Morbid obesity ([place]) 11/22/2012   OSA on CPAP 11/22/2012   Palpitations    Restless leg syndrome    patient denies this dx   Seasonal allergies    Status post laparoscopic hysterectomy 07/21/2015   STD (sexually transmitted disease)    hx HPV   Stomach cancer ([place])    SVD (spontaneous vaginal delivery)    x 1   Vulvar cyst     Past Surgical History:  Procedure Laterality Date   CERVICAL BIOPSY  W/ LOOP ELECTRODE EXCISION  2006   colonscopy  09/2019  polyps return in 5 years   COLPOSCOPY  2006   CYSTOSCOPY N/A 07/21/2015   Procedure: CYSTOSCOPY;  Surgeon: [name], MD;  Location: [facility];  Service: Gynecology;  Laterality: N/A;   ESOPHAGOGASTRODUODENOSCOPY N/A 03/25/2021   Procedure: ESOPHAGOGASTRODUODENOSCOPY (EGD);  Surgeon: Milus Banister, MD;  Location: [facility];  Service: Endoscopy;  Laterality: N/A;   ESOPHAGOGASTRODUODENOSCOPY (EGD) WITH PROPOFOL N/A 12/12/2019   Procedure: ESOPHAGOGASTRODUODENOSCOPY (EGD) WITH PROPOFOL;  Surgeon: Milus Banister, MD;  Location: [facility];  Service: Endoscopy;  Laterality: N/A;   EUS N/A 12/12/2019   Procedure: UPPER  ENDOSCOPIC ULTRASOUND (EUS) LINEAR;  Surgeon: Milus Banister, MD;  Location: [facility];  Service: Endoscopy;  Laterality: N/A;   EUS N/A 03/25/2021   Procedure: UPPER ENDOSCOPIC ULTRASOUND (EUS) RADIAL;  Surgeon: Milus Banister, MD;  Location: [facility];  Service: Endoscopy;  Laterality: N/A;   FINE NEEDLE ASPIRATION N/A 12/12/2019   Procedure: FINE NEEDLE ASPIRATION (FNA) LINEAR;  Surgeon: Milus Banister, MD;  Location: [facility];  Service: Endoscopy;  Laterality: N/A;   LESION REMOVAL Left 11/11/2019   Procedure: EXCISION OF LEFT VULVAR CYST;  Surgeon: [name], MD;  Location: [facility];  Service: Gynecology;  Laterality: Left;   ROBOTIC ASSISTED TOTAL HYSTERECTOMY WITH SALPINGECTOMY Bilateral 07/21/2015   Procedure: ROBOTIC ASSISTED TOTAL HYSTERECTOMY WITH SALPINGECTOMY;  Surgeon: [name], MD;  Location: [facility];  Service: Gynecology;  Laterality: Bilateral;   UPPER GI ENDOSCOPY  09/2019   WISDOM TOOTH EXTRACTION  1990's    Current Medications: Current Meds  Medication Sig   acetaminophen (TYLENOL) 500 MG tablet Take 1,000 mg by mouth every 8 (eight) hours as needed for mild pain.   albuterol (VENTOLIN HFA) 108 (90 Base) MCG/ACT inhaler Inhale 2 puffs into the lungs every 4 (four) hours as needed for shortness of breath.   amLODipine (NORVASC) 5 MG tablet Take 1 tablet (5 mg total) by mouth daily.   baclofen (LIORESAL) 10 MG tablet Take 10 mg by mouth daily as needed for muscle spasms.    budesonide-formoterol (SYMBICORT) 160-4.5 MCG/ACT inhaler Inhale 2 puffs into the lungs 2 (two) times daily as needed for shortness of breath.   DULoxetine (CYMBALTA) 60 MG capsule Take 60 mg by mouth daily.    fexofenadine (ALLEGRA) 180 MG tablet Take 180 mg by mouth daily.    fluticasone (FLONASE) 50 MCG/ACT nasal spray Place 2 sprays into both nostrils daily.    JANUVIA 100 MG tablet Take 100 mg by mouth daily.    losartan-hydrochlorothiazide (HYZAAR) 100-25 MG tablet Take 1 tablet by mouth daily.    montelukast (SINGULAIR) 10 MG tablet Take 10 mg by mouth at bedtime.    Multiple Vitamin (MULTIVITAMIN) capsule Take 1 capsule by mouth daily.   nitroGLYCERIN (NITROSTAT) 0.4 MG SL tablet Place 0.4 mg under the tongue as needed for chest pain.   potassium chloride (KLOR-CON) 10 MEQ tablet Take 10 mEq by mouth daily.   propranolol ER (INDERAL LA) 160 MG SR capsule Take 160 mg by mouth daily.   PROTONIX 40 MG tablet Take 40 mg by mouth as needed (indigestion, reflux).   rosuvastatin (CRESTOR) 5 MG tablet Take 5 mg by mouth at bedtime.    TRULICITY 3 GG/2.6RS SOPN Inject 3 mg into the skin once a week.   Vitamin D, Ergocalciferol, (DRISDOL) 50000 UNITS CAPS capsule Take 50,000 Units by mouth every Thursday.      Allergies:  Ace inhibitors, Elemental sulfur, and Penicillins   Social History   Socioeconomic History   Marital status: Married    Spouse name: TONY   Number of children: 1   Years of education: 12 + 4   Highest education level: Not on file  Occupational History   Occupation: DELI MANAGER  Tobacco Use   Smoking status: Never   Smokeless tobacco: Never  Vaping Use   Vaping Use: Never used  Substance and Sexual Activity   Alcohol use: Yes    Comment: 1 drink/month   Drug use: No   Sexual activity: Yes    Partners: Male    Birth control/protection: Surgical    Comment: R-TLH/Bil.salingectomy  Other Topics Concern   Not on file  Social History Narrative   Not on file   Social Determinants of Health   Financial Resource Strain: Not on file  Food Insecurity: Not on file  Transportation Needs: Not on file  Physical Activity: Not on file  Stress: Not on file  Social Connections: Not on file     Family History: The patient's family history includes Alzheimer's disease in her paternal grandfather; Breast cancer in her cousin and paternal aunt; Cancer in her maternal aunt; Diabetes  in her father and paternal grandmother; Hypertension in her father, maternal grandmother, mother, and paternal grandmother; Ovarian cancer in her maternal grandmother; Stroke in her maternal grandfather.  ROS:   Please see the history of present illness.    All other systems reviewed and are negative.  EKGs/Labs/Other Studies Reviewed:    The following studies were reviewed today: EKG reveals sinus rhythm and nonspecific ST-T changes.  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   Recent Labs: 07/02/2021: ALT 28; BUN 14; Creatinine 0.8; Hemoglobin 11.0; Platelets 342; Potassium 3.0; Sodium 141  Recent Lipid Panel No results found for: "CHOL", "TRIG", "HDL", "CHOLHDL", "VLDL", "LDLCALC", "LDLDIRECT"  Physical Exam:    VS:  BP 106/76   Pulse 75   Ht '5\' 4"'$  (1.626 m)   Wt 222 lb 3.2 oz (100.8 kg)   LMP 06/30/2015 (Approximate)   SpO2 97%   BMI 38.14 kg/m     Wt Readings from Last 3 Encounters:  02/09/22 222 lb 3.2 oz (100.8 kg)  12/09/21 216 lb 12.8 oz (98.3 kg)  07/02/21 217 lb 9.6 oz (98.7 kg)     GEN: Patient is in no acute distress HEENT: Normal NECK: No JVD; No carotid bruits LYMPHATICS: No lymphadenopathy CARDIAC: Hear sounds regular, 2/6 systolic murmur at the apex. RESPIRATORY:  Clear to auscultation without rales, wheezing or rhonchi  ABDOMEN: Soft, non-tender, non-distended MUSCULOSKELETAL:  No edema; No deformity  SKIN: Warm and dry NEUROLOGIC:  Alert and oriented x 3 PSYCHIATRIC:  Normal affect   Signed, Jenean Lindau, MD  02/09/2022 4:21 PM    [facility]

## 2022-02-10 ENCOUNTER — Ambulatory Visit: Payer: BC Managed Care – PPO | Admitting: Cardiology

## 2022-02-18 ENCOUNTER — Telehealth: Payer: Self-pay | Admitting: Cardiovascular Disease

## 2022-02-18 NOTE — Telephone Encounter (Signed)
Patient notified order will be sent over to [facility] in [place], however she needs appointment to be seen by Dr Claiborne Billings. Her last visit was in 2019. Message will be sent to [facility] for scheduling.

## 2022-02-18 NOTE — Telephone Encounter (Signed)
What problem are you experiencing? Needs new prescription sent for CPAP supplies. Is wanting it to go to [place] Patient  Who is your medical equipment company? Doesn't know    Please route to the sleep study assistant.

## 2022-03-30 ENCOUNTER — Encounter: Payer: Self-pay | Admitting: Podiatry

## 2022-03-30 ENCOUNTER — Ambulatory Visit: Payer: BC Managed Care – PPO | Admitting: Podiatry

## 2022-03-30 DIAGNOSIS — L600 Ingrowing nail: Secondary | ICD-10-CM | POA: Diagnosis not present

## 2022-03-30 DIAGNOSIS — L603 Nail dystrophy: Secondary | ICD-10-CM

## 2022-03-30 NOTE — Patient Instructions (Signed)

## 2022-03-30 NOTE — Progress Notes (Signed)
Subjective:  Patient ID: Krista [name], female    DOB: 01/03/1971,   MRN: [id number]  Chief Complaint  Patient presents with   Ingrown Toenail    diabetic with bil great toenails ingrown    52 y.o. female presents for concern of ingrown toenails particularly the right grea toe. Does have some concerns about her left great and right second but not much pain with them today. She is diabetic and Last A1c was 6.5 according to patient.  . Denies any other pedal complaints. Denies n/v/f/c.   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   GERD (gastroesophageal reflux disease)    GIST (gastrointestinal stromal tumor), malignant (HCC)    History of palpitations yrs ago   PVCs - wore holter monitor, negative results   HTN (hypertension) 11/22/2012   Hypertension    Mixed dyslipidemia 04/05/2021   Morbid obesity ([place]) 11/22/2012   OSA on CPAP 11/22/2012   Palpitations    Restless leg syndrome    patient denies this dx   Seasonal allergies    Status post laparoscopic hysterectomy 07/21/2015   STD (sexually transmitted disease)    hx HPV   Stomach cancer ([place])    SVD (spontaneous vaginal delivery)    x 1   Vulvar cyst     Objective:  Physical Exam: Vascular: DP/PT pulses 2/4 bilateral. CFT <3 seconds. Normal hair growth on digits. No edema.  Skin. No lacerations or abrasions bilateral feet. Incurvation of medial border or right hallux with tenderness to palpation. No erythema edema or purulence noted. Thickened left hallux and right second toesnails right second toenail is  dystrophic,  Musculoskeletal: MMT 5/5 bilateral lower extremities in DF, PF, Inversion and Eversion. Deceased ROM in DF of ankle joint.  Neurological: Sensation intact to light touch.   Assessment:   1. Ingrown right greater toenail   2. Dystrophic nail      Plan:  Patient was evaluated and treated and all questions answered. Discussed ingrown toenails etiology and treatment options including procedure for removal vs conservative care.  Patient requesting removal of ingrown nail today. Procedure below.  Discussed procedure and post procedure care and patient expressed understanding.  Discussed possible procedure on right second in the future and if the left starts to bother her that one as well.  Will follow-up in 2 weeks for nail check or sooner if any problems arise.    Procedure:  Procedure: partial Nail Avulsion of right hallux medial nail border.  Surgeon: Lorenda Peck, DPM  Pre-op Dx: Ingrown toenail without infection Post-op: Same  Place of Surgery: Office exam room.  Indications for surgery: Painful and ingrown toenail.    The patient is requesting removal of nail with chemical matrixectomy. Risks and complications were discussed with the patient for which they understand and written consent was obtained. Under sterile conditions a total of 3 mL of  1% lidocaine plain was infiltrated in a hallux block fashion. Once anesthetized, the skin was prepped in sterile fashion. A tourniquet  was then applied. Next the medial aspect of hallux nail border was then sharply excised making sure to remove the entire offending nail border.  Next phenol was then applied under standard conditions and copiously irrigated. Silvadene was applied. A dry sterile dressing was applied. After application of the dressing the tourniquet was removed and there is found to be an immediate capillary refill time to the digit. The patient tolerated the procedure well without any complications. Post procedure  instructions were discussed the patient for which he verbally understood. Follow-up in two weeks for nail check or sooner if any problems are to arise. Discussed signs/symptoms of infection and directed to call the office immediately should any occur or go directly to the emergency room. In the meantime, encouraged to call the office with any questions, concerns, changes symptoms.   Lorenda Peck, DPM

## 2022-04-13 ENCOUNTER — Ambulatory Visit: Payer: BC Managed Care – PPO | Admitting: Podiatry

## 2022-04-13 ENCOUNTER — Encounter: Payer: Self-pay | Admitting: Podiatry

## 2022-04-13 DIAGNOSIS — M7661 Achilles tendinitis, right leg: Secondary | ICD-10-CM | POA: Diagnosis not present

## 2022-04-13 DIAGNOSIS — M722 Plantar fascial fibromatosis: Secondary | ICD-10-CM | POA: Diagnosis not present

## 2022-04-13 DIAGNOSIS — L600 Ingrowing nail: Secondary | ICD-10-CM | POA: Diagnosis not present

## 2022-04-13 NOTE — Progress Notes (Signed)
Subjective:  Patient ID: Krista [name], female    DOB: [birth year],   MRN: [id number]  Chief Complaint  Patient presents with   Follow-up    Nail check after ingrown nail removal    Ingrown Toenail    Possible 2nd on right foot removal    52 y.o. female presents for follow-up of right great ingrown toenail procedure. Relates she is doing well and not having too much pain. Separately she also relates irritation in the second right toe and wanting to discuss removal vs care. Also relates some heel pain that started today in the right heel. Did get new shoes a couple days ago. Has not started any treatments.  Denies any other pedal complaints. Denies n/v/f/c.   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   GERD (gastroesophageal reflux disease)    GIST (gastrointestinal stromal tumor), malignant (HCC)    History of palpitations yrs ago   PVCs - wore holter monitor, negative results   HTN (hypertension) 11/22/2012   Hypertension    Mixed dyslipidemia 04/05/2021   Morbid obesity ([place]) 11/22/2012   OSA on CPAP 11/22/2012   Palpitations    Restless leg syndrome    patient denies this dx   Seasonal allergies    Status post laparoscopic hysterectomy 07/21/2015   STD (sexually transmitted disease)    hx HPV   Stomach cancer ([place])    SVD (spontaneous vaginal delivery)    x 1   Vulvar cyst     Objective:  Physical Exam: Vascular: DP/PT pulses 2/4 bilateral. CFT <3 seconds. Normal hair growth on digits. No edema.  Skin. No lacerations or abrasions bilateral feet. Right hallux  nail is healing well. Right second digit nail dystrophic and tender to the touch.  Musculoskeletal: MMT 5/5 bilateral lower extremities in DF, PF, Inversion and Eversion. Deceased ROM in DF of ankle joint.  Neurological: Sensation intact to light touch.   Assessment:   1. Ingrown nail of second toe of right foot   2. Achilles tendinitis of right lower extremity   3. Plantar fasciitis of right foot      Plan:  Patient was evaluated and treated and all questions answered. Toe (right hallux )was evaluated and appears to be healing well.  May discontinue soaks and neosporin.   Discussed ingrown toenails etiology and treatment options including procedure for removal vs conservative care.  Patient requesting removal of ingrown nail today. Procedure below.  Discussed procedure and post procedure care and patient expressed understanding.  Will follow-up in 2 weeks for nail check or sooner if any problems arise.    Discussed plantar fasciitis and achilles tendonitis  with patient.  Discussed treatment options including, ice, NSAIDS, supportive shoes, bracing, and stretching. Stretching exercises provided to be done on a daily basis.   Anti-inflammatories as needed.  Defer injection today. Patient to return in the future if continued pain in this area.    Procedure:  Procedure: total Nail Avulsion of right second digit nail  Surgeon:  Lorenda Peck, DPM  Pre-op Dx: Ingrown toenail without infection Post-op: Same  Place of Surgery: Office exam room.  Indications for surgery: Painful and ingrown toenail.    The patient is requesting removal of nail with chemical matrixectomy. Risks and complications were discussed with the patient for which they understand and written consent was obtained. Under sterile conditions a total of 3 mL of  1% lidocaine plain was infiltrated in a hallux block fashion. Once anesthetized, the skin was prepped in sterile fashion. A tourniquet was then applied. Next  the entire right second digit nail was removed with hemostats.  Next phenol was then applied under standard conditions and copiously irrigated. Silvadene was applied. A dry sterile dressing was applied. After application of the dressing the tourniquet was removed and there is found to be an immediate capillary refill time to the digit. The patient tolerated the procedure well without any complications. Post procedure instructions were discussed the patient for which he verbally understood. Follow-up in two weeks for nail check or sooner if any problems are to arise. Discussed signs/symptoms of infection and directed to call the office immediately should any occur or go directly to the emergency room. In the meantime, encouraged to call the office with any questions, concerns, changes symptoms.    Lorenda Peck, DPM

## 2022-04-13 NOTE — Patient Instructions (Addendum)
Soak Instructions    THE DAY AFTER THE PROCEDURE  Place 1/4 cup of epsom salts (or betadine, or white vinegar) in a quart of warm tap water.  Submerge your foot or feet with outer bandage intact for the initial soak; this will allow the bandage to become moist and wet for easy lift off.  Once you remove your bandage, continue to soak in the solution for 20 minutes.  This soak should be done twice a day.  Next, remove your foot or feet from solution, blot dry the affected area and cover.  You may use a band aid large enough to cover the area or use gauze and tape.  Apply other medications to the area as directed by the doctor such as polysporin neosporin.  IF YOUR SKIN BECOMES IRRITATED WHILE USING THESE INSTRUCTIONS, IT IS OKAY TO SWITCH TO  WHITE VINEGAR AND WATER. Or you may use antibacterial soap and water to keep the toe clean  Monitor for any signs/symptoms of infection. Call the office immediately if any occur or go directly to the emergency room. Call with any questions/concerns.    [facility] Instructions-Post Nail Surgery  You have had your ingrown toenail and root treated with a chemical.  This chemical causes a burn that will drain and ooze like a blister.  This can drain for 6-8 weeks or longer.  It is important to keep this area clean, covered, and follow the soaking instructions dispensed at the time of your surgery.  This area will eventually dry and form a scab.  Once the scab forms you no longer need to soak or apply a dressing.  If at any time you experience an increase in pain, redness, swelling, or drainage, you should contact the office as soon as possible.   Achilles Tendinitis  with Rehab Achilles tendinitis is a disorder of the Achilles tendon. The Achilles tendon connects the large calf muscles (Gastrocnemius and Soleus) to the heel bone (calcaneus). This tendon is sometimes called the heel cord. It is important for pushing-off and standing on your toes and is  important for walking, running, or jumping. Tendinitis is often caused by overuse and repetitive microtrauma. SYMPTOMS Pain, tenderness, swelling, warmth, and redness may occur over the Achilles tendon even at rest. Pain with pushing off, or flexing or extending the ankle. Pain that is worsened after or during activity. CAUSES  Overuse sometimes seen with rapid increase in exercise programs or in sports requiring running and jumping. Poor physical conditioning (strength and flexibility or endurance). Running sports, especially training running down hills. Inadequate warm-up before practice or play or failure to stretch before participation. Injury to the tendon. PREVENTION  Warm up and stretch before practice or competition. Allow time for adequate rest and recovery between practices and competition. Keep up conditioning. Keep up ankle and leg flexibility. Improve or keep muscle strength and endurance. Improve cardiovascular fitness. Use proper technique. Use proper equipment (shoes, skates). To help prevent recurrence, taping, protective strapping, or an adhesive bandage may be recommended for several weeks after healing is complete. PROGNOSIS  Recovery may take weeks to several months to heal. Longer recovery is expected if symptoms have been prolonged. Recovery is usually quicker if the inflammation is due to a direct blow as compared with overuse or sudden strain. RELATED COMPLICATIONS  Healing time will be prolonged if the condition is not correctly treated. The injury must be given plenty of time to heal. Symptoms can reoccur if activity is resumed too soon.  Untreated, tendinitis may increase the risk of tendon rupture requiring additional time for recovery and possibly surgery. TREATMENT  The first treatment consists of rest anti-inflammatory medication, and ice to relieve the pain. Stretching and strengthening exercises after resolution of pain will likely help reduce the risk  of recurrence. Referral to a physical therapist or athletic trainer for further evaluation and treatment may be helpful. A walking boot or cast may be recommended to rest the Achilles tendon. This can help break the cycle of inflammation and microtrauma. Arch supports (orthotics) may be prescribed or recommended by your caregiver as an adjunct to therapy and rest. Surgery to remove the inflamed tendon lining or degenerated tendon tissue is rarely necessary and has shown less than predictable results. MEDICATION  Nonsteroidal anti-inflammatory medications, such as aspirin and ibuprofen, may be used for pain and inflammation relief. Do not take within 7 days before surgery. Take these as directed by your caregiver. Contact your caregiver immediately if any bleeding, stomach upset, or signs of allergic reaction occur. Other minor pain relievers, such as acetaminophen, may also be used. Pain relievers may be prescribed as necessary by your caregiver. Do not take prescription pain medication for longer than 4 to 7 days. Use only as directed and only as much as you need. Cortisone injections are rarely indicated. Cortisone injections may weaken tendons and predispose to rupture. It is better to give the condition more time to heal than to use them. HEAT AND COLD Cold is used to relieve pain and reduce inflammation for acute and chronic Achilles tendinitis. Cold should be applied for 10 to 15 minutes every 2 to 3 hours for inflammation and pain and immediately after any activity that aggravates your symptoms. Use ice packs or an ice massage. Heat may be used before performing stretching and strengthening activities prescribed by your caregiver. Use a heat pack or a warm soak. SEEK MEDICAL CARE IF: Symptoms get worse or do not improve in 2 weeks despite treatment. New, unexplained symptoms develop. Drugs used in treatment may produce side effects.  EXERCISES:  RANGE OF MOTION (ROM) AND STRETCHING EXERCISES  - Achilles Tendinitis  These exercises may help you when beginning to rehabilitate your injury. Your symptoms may resolve with or without further involvement from your physician, physical therapist or athletic trainer. While completing these exercises, remember:  Restoring tissue flexibility helps normal motion to return to the joints. This allows healthier, less painful movement and activity. An effective stretch should be held for at least 30 seconds. A stretch should never be painful. You should only feel a gentle lengthening or release in the stretched tissue.  STRETCH  Gastroc, Standing  Place hands on wall. Extend right / left leg, keeping the front knee somewhat bent. Slightly point your toes inward on your back foot. Keeping your right / left heel on the floor and your knee straight, shift your weight toward the wall, not allowing your back to arch. You should feel a gentle stretch in the right / left calf. Hold this position for 10 seconds. Repeat 3 times. Complete this stretch 2 times per day.  STRETCH  Soleus, Standing  Place hands on wall. Extend right / left leg, keeping the other knee somewhat bent. Slightly point your toes inward on your back foot. Keep your right / left heel on the floor, bend your back knee, and slightly shift your weight over the back leg so that you feel a gentle stretch deep in your back calf. Hold this position  for 10 seconds. Repeat 3 times. Complete this stretch 2 times per day.  STRETCH  Gastrocsoleus, Standing  Note: This exercise can place a lot of stress on your foot and ankle. Please complete this exercise only if specifically instructed by your caregiver.  Place the ball of your right / left foot on a step, keeping your other foot firmly on the same step. Hold on to the wall or a rail for balance. Slowly lift your other foot, allowing your body weight to press your heel down over the edge of the step. You should feel a stretch in your right /  left calf. Hold this position for 10 seconds. Repeat this exercise with a slight bend in your knee. Repeat 3 times. Complete this stretch 2 times per day.   STRENGTHENING EXERCISES - Achilles Tendinitis These exercises may help you when beginning to rehabilitate your injury. They may resolve your symptoms with or without further involvement from your physician, physical therapist or athletic trainer. While completing these exercises, remember:  Muscles can gain both the endurance and the strength needed for everyday activities through controlled exercises. Complete these exercises as instructed by your physician, physical therapist or athletic trainer. Progress the resistance and repetitions only as guided. You may experience muscle soreness or fatigue, but the pain or discomfort you are trying to eliminate should never worsen during these exercises. If this pain does worsen, stop and make certain you are following the directions exactly. If the pain is still present after adjustments, discontinue the exercise until you can discuss the trouble with your clinician.  STRENGTH - Plantar-flexors  Sit with your right / left leg extended. Holding onto both ends of a rubber exercise band/tubing, loop it around the ball of your foot. Keep a slight tension in the band. Slowly push your toes away from you, pointing them downward. Hold this position for 10 seconds. Return slowly, controlling the tension in the band/tubing. Repeat 3 times. Complete this exercise 2 times per day.   STRENGTH - Plantar-flexors  Stand with your feet shoulder width apart. Steady yourself with a wall or table using as little support as needed. Keeping your weight evenly spread over the width of your feet, rise up on your toes.* Hold this position for 10 seconds. Repeat 3 times. Complete this exercise 2 times per day.  *If this is too easy, shift your weight toward your right / left leg until you feel challenged. Ultimately, you  may be asked to do this exercise with your right / left foot only.  STRENGTH  Plantar-flexors, Eccentric  Note: This exercise can place a lot of stress on your foot and ankle. Please complete this exercise only if specifically instructed by your caregiver.  Place the balls of your feet on a step. With your hands, use only enough support from a wall or rail to keep your balance. Keep your knees straight and rise up on your toes. Slowly shift your weight entirely to your right / left toes and pick up your opposite foot. Gently and with controlled movement, lower your weight through your right / left foot so that your heel drops below the level of the step. You will feel a slight stretch in the back of your calf at the end position. Use the healthy leg to help rise up onto the balls of both feet, then lower weight only on the right / left leg again. Build up to 15 repetitions. Then progress to 3 consecutive sets of 15 repetitions.*  After completing the above exercise, complete the same exercise with a slight knee bend (about 30 degrees). Again, build up to 15 repetitions. Then progress to 3 consecutive sets of 15 repetitions.* Perform this exercise 2 times per day.  *When you easily complete 3 sets of 15, your physician, physical therapist or athletic trainer may advise you to add resistance by wearing a backpack filled with additional weight.  STRENGTH - Plantar Flexors, Seated  Sit on a chair that allows your feet to rest flat on the ground. If necessary, sit at the edge of the chair. Keeping your toes firmly on the ground, lift your right / left heel as far as you can without increasing any discomfort in your ankle. Repeat 3 times. Complete this exercise 2 times a day.

## 2022-04-27 ENCOUNTER — Ambulatory Visit: Payer: BC Managed Care – PPO | Admitting: Podiatry

## 2022-05-04 ENCOUNTER — Ambulatory Visit (INDEPENDENT_AMBULATORY_CARE_PROVIDER_SITE_OTHER): Payer: BC Managed Care – PPO | Admitting: Podiatry

## 2022-05-04 DIAGNOSIS — L600 Ingrowing nail: Secondary | ICD-10-CM

## 2022-05-04 NOTE — Progress Notes (Signed)
  Subjective:  Patient ID: Krista [name], female    DOB: 1970-08-20,   MRN: [id number]  Chief Complaint  Patient presents with   nail check    52 y.o. female presents for follow-up of right second digit nail avulsion. Relates she has a concern for a blister on the toe. Relates pain wise doing ok. Has been soaking as instructed . Denies any other pedal complaints. Denies n/v/f/c.   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 CFT <3 seconds. Normal hair growth on digits. No edema.  Skin. No lacerations or abrasions bilateral feet. Right second digit nail bed healing well there is a proximal blister but upon debridement no open lesions or concerns.  Musculoskeletal: MMT 5/5 bilateral lower extremities in DF, PF, Inversion and Eversion. Deceased ROM in DF of ankle joint.  Neurological: Sensation intact to light touch.    Assessment:  No diagnosis found.   Plan:  Patient was evaluated and treated and all questions answered. Toe was evaluated and appears to be healing well.  May discontinue soaks and neosporin.  Patient to follow-up as needed.    Lorenda Peck, DPM

## 2022-05-16 ENCOUNTER — Encounter: Payer: Self-pay | Admitting: Cardiovascular Disease

## 2022-05-16 ENCOUNTER — Ambulatory Visit: Payer: BC Managed Care – PPO | Attending: Cardiovascular Disease | Admitting: Cardiovascular Disease

## 2022-05-16 VITALS — BP 122/78 | HR 87 | Ht 66.0 in | Wt 225.0 lb

## 2022-05-16 DIAGNOSIS — I10 Essential (primary) hypertension: Secondary | ICD-10-CM

## 2022-05-16 DIAGNOSIS — G4733 Obstructive sleep apnea (adult) (pediatric): Secondary | ICD-10-CM

## 2022-05-16 DIAGNOSIS — E782 Mixed hyperlipidemia: Secondary | ICD-10-CM | POA: Diagnosis not present

## 2022-05-16 DIAGNOSIS — E669 Obesity, unspecified: Secondary | ICD-10-CM

## 2022-05-16 DIAGNOSIS — E088 Diabetes mellitus due to underlying condition with unspecified complications: Secondary | ICD-10-CM

## 2022-05-16 NOTE — Patient Instructions (Signed)
Medication Instructions:  None ordered   Lab Work: None ordered   Testing/Procedures: None ordered   Follow-Up: At [facility], you and your health needs are our priority.  As part of our continuing mission to provide you with exceptional heart care, we have created designated Provider Care Teams.  These Care Teams include your primary Cardiologist (physician) and Advanced Practice Providers (APPs -  Physician Assistants and Nurse Practitioners) who all work together to provide you with the care you need, when you need it.  We recommend signing up for the patient portal called "MyChart".  Sign up information is provided on this After Visit Summary.  MyChart is used to connect with patients for Virtual Visits (Telemedicine).  Patients are able to view lab/test results, encounter notes, upcoming appointments, etc.  Non-urgent messages can be sent to your provider as well.   To learn more about what you can do with MyChart, go to [email or web address].    Your next appointment:  November     Call in August to schedule Nov appointment     Provider:  [facility]

## 2022-05-16 NOTE — Progress Notes (Signed)
Cardiology Office Note    Date:  05/22/2022   ID:  Krista [name], Krista [name] [birth year], MRN [id number]  PCP:  Lucianne Lei, MD  Cardiologist:  Nicki Guadalajara, MD (sleep); Dr. Rennis Golden Dr Revankar  4.5  year F/U sleep evaluation  History of Present Illness:  Krista [name] is a 52 y.o. female who has a history of hypertension, type 2 diabetes mellitus, anxiety, palpitations and prior diagnosis of obstructive sleep apnea.  I saw her on November 10, 2017 for initial sleep evaluation. She presents for a 4 1/2 year follow-up evaluation.  Krista [name] is a patient of Dr. Rennis Golden who was diagnosed with sleep apnea in May 2012.  At that time, she had mild overall sleep apnea with an AHI of 5.9, RDI of 8.1/h, but during rem sleep AHI was 28.1.  She states that she was on CPAP therapy for approximately 1 year but that ultimately had machine malfunction.  She was never seen subsequently.  She was recently evaluated by Joni Reining with complaints of frequent palpitations with PVCs.  During that evaluation, it became apparent that she had not been utilizing CPAP therapy for years.  She was scheduled for a sleep study which was done on August 18, 2017 as a home study which now confirmed mild to moderate overall sleep apnea with an AHI of 14.4.  Her events with worse with supine sleep with an AHI of 22.0.  Since this was a home study there was no way to know the severity of her sleep apnea during REM sleep.  She snored for 53.7% of the night and had significant oxygen desaturation to a nadir of 84%.  She was started on CPAP therapy with [facility] as her DME company with set up date on September 11, 2017.  She has an ResMed air sense 10 AutoSet unit and for her auto titration had been on a minimum pressure of 5 with a maximum pressure up to 20 cm.  A download in the office today for the past month shows 100% compliance.  There is no leak.  Her 95th percentile pressure was 12.1 with a maximum average  pressure of 13.5.  I saw her for my new sleep evaluation in October 2019.  Since reinitiating CPAP therapy, she feels much better.  Her nocturia has significantly improved.  She is has more energy.  Her morning headaches have resolved.  She is unaware of any snoring.  During that evaluation I changed her CPAP settings to minimum pressure of 10 with maximum pressure of 18.  Her most recent download was 1.3.  I have not seen her in over 4 and half years.  Apparently, she had recently seen Dr. Tomie China for primary hypertension.  She continues to use her ResMed air sense 10 AutoSet CPAP unit with set up date September 11, 2017.  [facility] has been her DME company.  She will need to transition to a new DME company based on insurance since they are no longer providing CPAP.  She typically goes to bed around 10:30 PM and wakes up at 5:45 AM.  She admits to being under significant stress with work.  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  Her most recent download from March 14 through May 13, 2022 shows suboptimal compliance with only 50% of usage days.  However, she lives in an area with poor Internet.  She states she has been using CPAP on a  daily basis and I suspect the download was artifactual and does not show any use since April 1 but she admits that she has used it every single day over the last 2 weeks when there is no reported use.  Her pressure is set at a range of 10 to 18 cm of water.  AHI is excellent at 1.0 with 95th percentile pressure at 14.2 and maximum average pressure 15.3.  She needs new tubing and a new mask.  She presents for evaluation.    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   GERD (gastroesophageal reflux disease)    GIST (gastrointestinal stromal tumor), malignant    History of palpitations yrs ago   PVCs - wore holter monitor, negative results   HTN (hypertension) 11/22/2012   Hypertension    Mixed dyslipidemia 04/05/2021   Morbid obesity 11/22/2012   OSA on CPAP 11/22/2012   Palpitations    Restless leg syndrome    patient denies this dx   Seasonal allergies    Status post laparoscopic hysterectomy 07/21/2015   STD (sexually transmitted disease)    hx HPV   Stomach cancer    SVD (spontaneous vaginal delivery)    x 1   Vulvar cyst     Past Surgical History:  Procedure Laterality Date   CERVICAL BIOPSY  W/ LOOP ELECTRODE EXCISION  2006   colonscopy  09/2019   polyps return in 5 years   COLPOSCOPY  2006   CYSTOSCOPY N/A 07/21/2015   Procedure: CYSTOSCOPY;  Surgeon: Patton Salles, MD;  Location: [facility];  Service: Gynecology;  Laterality: N/A;   ESOPHAGOGASTRODUODENOSCOPY N/A 03/25/2021   Procedure: ESOPHAGOGASTRODUODENOSCOPY (EGD);  Surgeon: Rachael Fee, MD;  Location: [facility];  Service: Endoscopy;  Laterality: N/A;   ESOPHAGOGASTRODUODENOSCOPY (EGD) WITH PROPOFOL N/A 12/12/2019   Procedure: ESOPHAGOGASTRODUODENOSCOPY (EGD) WITH PROPOFOL;  Surgeon: Rachael Fee, MD;  Location: [facility];  Service: Endoscopy;  Laterality: N/A;   EUS N/A 12/12/2019   Procedure: UPPER ENDOSCOPIC ULTRASOUND (EUS) LINEAR;  Surgeon: Rachael Fee, MD;  Location: [facility];  Service: Endoscopy;  Laterality: N/A;   EUS N/A 03/25/2021   Procedure: UPPER ENDOSCOPIC ULTRASOUND (EUS) RADIAL;  Surgeon: Rachael Fee, MD;  Location: [facility];  Service: Endoscopy;  Laterality: N/A;   FINE NEEDLE ASPIRATION N/A 12/12/2019   Procedure: FINE NEEDLE ASPIRATION (FNA) LINEAR;  Surgeon: Rachael Fee, MD;  Location:  [facility];  Service: Endoscopy;  Laterality: N/A;   LESION REMOVAL Left 11/11/2019   Procedure: EXCISION OF LEFT VULVAR CYST;  Surgeon: Patton Salles, MD;  Location: [facility];  Service: Gynecology;  Laterality: Left;   ROBOTIC ASSISTED TOTAL HYSTERECTOMY WITH SALPINGECTOMY Bilateral 07/21/2015   Procedure: ROBOTIC ASSISTED TOTAL HYSTERECTOMY WITH SALPINGECTOMY;  Surgeon: Patton Salles, MD;  Location: [facility];  Service: Gynecology;  Laterality: Bilateral;   UPPER GI ENDOSCOPY  09/2019   WISDOM TOOTH EXTRACTION  1990's    Current Medications: Outpatient Medications  Prior to Visit  Medication Sig Dispense Refill   acetaminophen (TYLENOL) 500 MG tablet Take 1,000 mg by mouth every 8 (eight) hours as needed for mild pain.     albuterol (VENTOLIN HFA) 108 (90 Base) MCG/ACT inhaler Inhale 2 puffs into the lungs every 4 (four) hours as needed for shortness of breath.     baclofen (LIORESAL) 10 MG tablet Take 10 mg by mouth daily as needed for muscle spasms.      budesonide-formoterol (SYMBICORT) 160-4.5 MCG/ACT inhaler Inhale 2 puffs into the lungs 2 (two) times daily as needed for shortness of breath.     DULoxetine (CYMBALTA) 60 MG capsule Take 60 mg by mouth daily.      fexofenadine (ALLEGRA) 180 MG tablet Take 180 mg by mouth daily.      fluticasone (FLONASE) 50 MCG/ACT nasal spray Place 2 sprays into both nostrils daily.      JANUVIA 100 MG tablet Take 100 mg by mouth daily.     losartan-hydrochlorothiazide (HYZAAR) 100-25 MG tablet Take 1 tablet by mouth daily.      montelukast (SINGULAIR) 10 MG tablet Take 10 mg by mouth at bedtime.      Multiple Vitamin (MULTIVITAMIN) capsule Take 1 capsule by mouth daily.     nitroGLYCERIN (NITROSTAT) 0.4 MG SL tablet Place 0.4 mg under the tongue as needed for chest pain.     potassium chloride (KLOR-CON) 10 MEQ tablet Take 10 mEq by mouth daily.     propranolol ER (INDERAL LA) 160 MG SR capsule Take 160 mg by  mouth daily.     PROTONIX 40 MG tablet Take 40 mg by mouth as needed (indigestion, reflux).     rosuvastatin (CRESTOR) 5 MG tablet Take 5 mg by mouth at bedtime.      TRULICITY 3 MG/0.5ML SOPN Inject 3 mg into the skin once a week.     Vitamin D, Ergocalciferol, (DRISDOL) 50000 UNITS CAPS capsule Take 50,000 Units by mouth every Thursday.      amLODipine (NORVASC) 5 MG tablet Take 1 tablet (5 mg total) by mouth daily. 90 tablet 3   No facility-administered medications prior to visit.     Allergies:   Ace inhibitors, Elemental sulfur, and Penicillins   Social History   Socioeconomic History   Marital status: Married    Spouse name: TONY   Number of children: 1   Years of education: 12 + 4   Highest education level: Not on file  Occupational History   Occupation: DELI MANAGER  Tobacco Use   Smoking status: Never   Smokeless tobacco: Never  Vaping Use   Vaping Use: Never used  Substance and Sexual Activity   Alcohol use: Yes    Comment: 1 drink/month   Drug use: No   Sexual activity: Yes    Partners: Male    Birth control/protection: Surgical    Comment: R-TLH/Bil.salingectomy  Other Topics Concern   Not on file  Social History Narrative   Not on file   Social Determinants of Health   Financial Resource Strain: Not on file  Food Insecurity: Not on file  Transportation Needs: Not on file  Physical Activity: Not on file  Stress: Not on file  Social Connections: Not on file     Family History:  The patient's family history includes Alzheimer's disease in her paternal grandfather; Breast cancer in her cousin and paternal aunt; Cancer in her maternal aunt; Diabetes in her father and paternal grandmother; Hypertension in her father, maternal  grandmother, mother, and paternal grandmother; Ovarian cancer in her maternal grandmother; Stroke in her maternal grandfather.   ROS General: Negative; No fevers, chills, or night sweats;  HEENT: Negative; No changes in vision or  hearing, sinus congestion, difficulty swallowing Pulmonary: Negative; No cough, wheezing, shortness of breath, hemoptysis Cardiovascular: Negative; No chest pain, presyncope, syncope, palpitations GI: Negative; No nausea, vomiting, diarrhea, or abdominal pain GU: Negative; No dysuria, hematuria, or difficulty voiding Musculoskeletal: Negative; no myalgias, joint pain, or weakness Hematologic/Oncology: Negative; no easy bruising, bleeding Endocrine: Negative; no heat/cold intolerance; no diabetes Neuro: Negative; no changes in balance, headaches Skin: Negative; No rashes or skin lesions Psychiatric: Negative; No behavioral problems, depression Sleep: Positive for OSA , initially diagnosed 2012.  Recently reconfirmed on a home sleep study.  No residual snoring.  Mild residual daytime sleepiness;  no bruxism, restless legs, hypnogognic hallucinations, no cataplexy  An Epworth Sleepiness Scale score was calculated in the office today and this endorsed at 12 consistent with mild residual daytime sleepiness.  Other comprehensive 14 point system review is negative.   PHYSICAL EXAM:   VS:  BP 122/78   Pulse 87   Ht  (1.676 m)   Wt 225 lb (102.1 kg)   LMP 06/30/2015 (Approximate)   SpO2 100%   BMI 36.32 kg/m     Repeat blood pressure by me was 114/76  Wt Readings from Last 3 Encounters:  05/16/22 225 lb (102.1 kg)  02/09/22 222 lb 3.2 oz (100.8 kg)  12/09/21 216 lb 12.8 oz (98.3 kg)   General: Alert, oriented, no distress.  Skin: normal turgor, no rashes, warm and dry HEENT: Normocephalic, atraumatic. Pupils equal round and reactive to light; sclera anicteric; extraocular muscles intact;  Nose without nasal septal hypertrophy Mouth/Parynx benign; Mallinpatti scale 3 Neck: No JVD, no carotid bruits; normal carotid upstroke Lungs: clear to ausculatation and percussion; no wheezing or rales Chest wall: without tenderness to palpitation Heart: PMI not displaced, RRR, s1 s2 normal,  1/6 systolic murmur, no diastolic murmur, no rubs, gallops, thrills, or heaves Abdomen: soft, nontender; no hepatosplenomehaly, BS+; abdominal aorta nontender and not dilated by palpation. Back: no CVA tenderness Pulses 2+ Musculoskeletal: full range of motion, normal strength, no joint deformities Extremities: no clubbing cyanosis or edema, Homan's sign negative  Neurologic: grossly nonfocal; Cranial nerves grossly wnl Psychologic: Normal mood and affect    Studies/Labs Reviewed:   November 10, 2017 ECG (independently read by me): Normal sinus rhythm at 84 bpm.  Poor anterior R wave progression.  Inferolateral ST changes.  Recent Labs:    Latest Ref Rng & Units 07/02/2021   12:00 AM 04/05/2021    4:37 PM 11/07/2019    4:24 PM  BMP  Glucose 70 - 99 mg/dL  161  80   BUN 4 - Creatinine 0.5 - 1.1 0.8     0.79  0.65   BUN/Creat Ratio 9 - 23  11    Sodium 137 - 147 141     137  142   Potassium 3.5 - 5.1 mEq/L 3.0     3.9  3.3   Chloride 99 - 108 102     99  100   CO2 13 - Calcium 8.7 - 10.7 9.2     9.6  9.3      This result is from an external source.  Latest Ref Rng & Units 07/02/2021   12:00 AM  Hepatic Function  Albumin 3.5 - 5.0 4.1      AST 13 - 35 28      ALT 7 - 35 U/L 28      Alk Phosphatase 25 - 125 106         This result is from an external source.       Latest Ref Rng & Units 07/02/2021   12:00 AM 11/07/2019    4:24 PM 09/03/2015    2:03 PM  CBC  WBC  7.6     7.8  7.7   Hemoglobin 12.0 - 16.0 11.0     10.9  12.0   Hematocrit 36 - 46 35     34.4  37.5   Platelets 150 - 400 K/uL 342     301  332      This result is from an external source.   Lab Results  Component Value Date   MCV 83.7 11/07/2019   MCV 81.9 09/03/2015   MCV 81.9 07/22/2015   No results found for: "TSH" No results found for: "HGBA1C"   BNP No results found for: "BNP"  ProBNP No results found for: "PROBNP"   Lipid Panel  No results found  for: "CHOL", "TRIG", "HDL", "CHOLHDL", "VLDL", "LDLCALC", "LDLDIRECT"   RADIOLOGY: No results found.   Additional studies/ records that were reviewed today include:  I reviewed the patient's prior initial sleep studies from the [facility] from 2012.  I reviewed her home study and obtained a new download in the office today since initiation of CPAP therapy.  A new download was obtained from March 14 through May 13, 2022.  An Epworth Sleepiness Scale score today was calculated and this endorsed at 18 consistent with excessive daytime sleepiness.  ASSESSMENT:    1. OSA on CPAP   2. Primary hypertension   3. Obesity, Class II, BMI 35-39.9   4. Mixed dyslipidemia   5. Diabetes mellitus due to underlying condition with unspecified complications     PLAN:  Ms. Lillia Lengel is a 52 year-old female who has a history of morbid obesity, palpitations, hypertension, and diabetes mellitus.  She also has a history of anxiety.  She was diagnosed with mild overall sleep apnea but had moderately severe sleep apnea during REM sleep on her in lab evaluation in 2012.  On her most recent home study in July 2019, her overall sleep apnea was approaching moderate with an AHI of 14.4.  I suspect the severity of her REM sleep is now severe.  She has significant positional component with an AHI of 22 with supine posture.  Since initiating CPAP therapy she has felt significantly improved.  During that evaluation I again reviewed with her the data regarding sleep architecture and the effects of obstructive sleep apnea on its normal development.  We discussed the cardiovascular implications associated with untreated sleep apnea.  Presently, she admits that she has been using CPAP on a daily basis.  She [place] in a poor Internet region and unfortunately, apparently her most recent download has not detected any use since April 1 but she admits that she has used it daily.  She had previously been  using [facility] as her DME company.  She will need to be set up with a new DME company based on her [employer].  She is in need of new equipment.  She  has been using a fullface mask.  I provided her with a new ResMed AirFit F30 i mask.  She will need new tubing.  I discussed optimal sleep duration at 7 and 9 hours.  Her blood pressure today was stable at 114/76.  She has continued to be on amlodipine 5 mg, losartan HCT 100/25 mg, long-acting propranolol 160 mg SR for hypertension.  She is on Januvia and Trulicity for diabetes.  She takes rosuvastatin 5 mg for hyperlipidemia and pantoprazole for GERD.  She now sees Dr. Tomie China for cardiology care.  I will see her in 6 to 9 months for follow-up sleep evaluation.  Medication Adjustments/Labs and Tests Ordered: Current medicines are reviewed at length with the patient today.  Concerns regarding medicines are outlined above.  Medication changes, Labs and Tests ordered today are listed in the Patient Instructions below. Patient Instructions  Medication Instructions:  None ordered   Lab Work: None ordered   Testing/Procedures: None ordered   Follow-Up: At [facility], you and your health needs are our priority.  As part of our continuing mission to provide you with exceptional heart care, we have created designated Provider Care Teams.  These Care Teams include your primary Cardiologist (physician) and Advanced Practice Providers (APPs -  Physician Assistants and Nurse Practitioners) who all work together to provide you with the care you need, when you need it.  We recommend signing up for the patient portal called "MyChart".  Sign up information is provided on this After Visit Summary.  MyChart is used to connect with patients for Virtual Visits (Telemedicine).  Patients are able to view lab/test results, encounter notes, upcoming appointments, etc.  Non-urgent messages can be sent to your  provider as well.   To learn more about what you can do with MyChart, go to [email or web address].    Your next appointment:  November     Call in August to schedule Nov appointment     Provider:  [facility]     Signed, Nicki Guadalajara, MD,  [facility], ABSM Diplomate, American Board of Sleep Medicine  It but it is I will know if it is can restart the trying to get it restarted but he notes the same whether it is 30 miles away would either eating a [place] here so they are coming here 05/22/2022 5:55 PM    [facility] [address], [place], [place]  [address] Phone: [phone]

## 2022-05-17 ENCOUNTER — Telehealth: Payer: Self-pay | Admitting: Cardiovascular Disease

## 2022-05-17 NOTE — Telephone Encounter (Signed)
Patient is requesting to speak with Burna Mortimer, CMA--She states she is supposed to be bringing in a card for Dr. Laddie Endo, so new equipment can be ordered. However, she states her machine does not have a card. She would like a call back to discuss.

## 2022-05-18 ENCOUNTER — Other Ambulatory Visit: Payer: Self-pay | Admitting: Cardiology

## 2022-05-19 NOTE — Telephone Encounter (Signed)
Returned a call to the patient to inform her to bring the machine to me. I have a card to put inside of it. Once I put the card inside it will download all of her information from the card. She states that she will bring it to me next week.

## 2022-05-22 ENCOUNTER — Encounter: Payer: Self-pay | Admitting: Cardiovascular Disease

## 2022-05-26 ENCOUNTER — Telehealth: Payer: Self-pay | Admitting: *Deleted

## 2022-05-26 NOTE — Telephone Encounter (Signed)
Orders for supplies sent to [facility] via [employer] portal.

## 2022-06-07 ENCOUNTER — Other Ambulatory Visit: Payer: BC Managed Care – PPO

## 2022-06-07 ENCOUNTER — Ambulatory Visit: Payer: BC Managed Care – PPO | Admitting: Oncology

## 2022-06-07 NOTE — Progress Notes (Signed)
[facility]  [address] [place],  [place]  [address] [phone]  Clinic Day: 12/09/2021  Referring physician: Lucianne Lei, MD   HISTORY OF PRESENT ILLNESS:  The patient is a 52 y.o. 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  Only 1 mitosis was seen per high powered field, consistent with low grade disease.  The patient comes in today to for routine follow-up, as well as to review her CT scans.  Since her last visit, the patient has been doing well.  Other than occasional constipation, she denies having other GI symptoms which concern her for disease recurrence.     PHYSICAL EXAM:  Last menstrual period 06/30/2015. Wt Readings from Last 3 Encounters:  05/16/22 225 lb (102.1 kg)  02/09/22 222 lb 3.2 oz (100.8 kg)  12/09/21 216 lb 12.8 oz (98.3 kg)   There is no height or weight on file to calculate BMI. Performance status (ECOG): 0 - Asymptomatic Physical Exam Constitutional:      Appearance: Normal appearance. She is not ill-appearing.  HENT:     Mouth/Throat:     Mouth: Mucous membranes are moist.     Pharynx: Oropharynx is clear. No oropharyngeal exudate or posterior oropharyngeal erythema.  Cardiovascular:     Rate and Rhythm: Normal rate and regular rhythm.     Heart sounds: No murmur heard.    No friction rub. No gallop.  Pulmonary:     Effort: Pulmonary effort is normal. No respiratory distress.     Breath sounds: Normal breath sounds. No wheezing, rhonchi or rales.  Abdominal:     General: Bowel sounds are normal. There is no distension.     Palpations: Abdomen is soft. There is no mass.     Tenderness: There is no abdominal tenderness.  Musculoskeletal:        General: No swelling.     Right lower leg: No edema.     Left lower leg: No edema.  Lymphadenopathy:     Cervical: No  cervical adenopathy.     Upper Body:     Right upper body: No supraclavicular or axillary adenopathy.     Left upper body: No supraclavicular or axillary adenopathy.     Lower Body: No right inguinal adenopathy. No left inguinal adenopathy.  Skin:    General: Skin is warm.     Coloration: Skin is not jaundiced.     Findings: No lesion or rash.  Neurological:     General: No focal deficit present.     Mental Status: She is alert and oriented to person, place, and time. Mental status is at baseline.  Psychiatric:        Mood and Affect: Mood normal.        Behavior: Behavior normal.        Thought Content: Thought content normal.    SCANS:  CT scans of her abdomen/pelvis revealed the following:  LABS:     ASSESSMENT & PLAN:  A 52 y.o. 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  In clinic today, I went over all of her CT scan images with her, for which she could see that she remains disease free.  Clinically, she is doing very well.  I will see her back in 6 months for repeat clinical assessment. The patient understands  all the plans discussed today and is in agreement with them.  Pearlina Friedly Macarthur Critchley, MD

## 2022-06-08 ENCOUNTER — Inpatient Hospital Stay: Payer: BC Managed Care – PPO | Admitting: Oncology

## 2022-06-08 ENCOUNTER — Telehealth: Payer: Self-pay | Admitting: Oncology

## 2022-06-08 ENCOUNTER — Other Ambulatory Visit: Payer: Self-pay | Admitting: Oncology

## 2022-06-08 ENCOUNTER — Inpatient Hospital Stay: Payer: BC Managed Care – PPO | Attending: Oncology

## 2022-06-08 VITALS — BP 138/84 | HR 80 | Temp 98.8°F | Resp 18 | Ht 66.0 in | Wt 222.3 lb

## 2022-06-08 DIAGNOSIS — C49A2 Gastrointestinal stromal tumor of stomach: Secondary | ICD-10-CM

## 2022-06-08 DIAGNOSIS — Z86003 Personal history of in-situ neoplasm of oral cavity, esophagus and stomach: Secondary | ICD-10-CM | POA: Insufficient documentation

## 2022-06-08 LAB — CMP (CANCER CENTER ONLY)
ALT: 21 U/L (ref 0–44)
AST: 18 U/L (ref 15–41)
Albumin: 3.9 g/dL (ref 3.5–5.0)
Alkaline Phosphatase: 87 U/L (ref 38–126)
Anion gap: 9 (ref 5–15)
BUN: 10 mg/dL (ref 6–20)
CO2: 28 mmol/L (ref 22–32)
Calcium: 8.9 mg/dL (ref 8.9–10.3)
Chloride: 101 mmol/L (ref 98–111)
Creatinine: 0.72 mg/dL (ref 0.44–1.00)
GFR, Estimated: >60 mL/min (ref >60)
Glucose, Bld: 106 mg/dL — ABNORMAL HIGH (ref 70–99)
Potassium: 3.3 mmol/L — ABNORMAL LOW (ref 3.5–5.1)
Sodium: 138 mmol/L (ref 135–145)
Total Bilirubin: 0.6 mg/dL (ref 0.3–1.2)
Total Protein: 7.6 g/dL (ref 6.5–8.1)

## 2022-06-08 LAB — IRON AND TIBC
Iron: 58 ug/dL (ref 28–170)
Saturation Ratios: 15 % (ref 10.4–31.8)
TIBC: 399 ug/dL (ref 250–450)
UIBC: 341 ug/dL

## 2022-06-08 LAB — CBC AND DIFFERENTIAL
HCT: 36 (ref 36–46)
Hemoglobin: 11.7 — AB (ref 12.0–16.0)
Neutrophils Absolute: 3.85
Platelets: 287 10*3/uL (ref 150–400)
WBC: 7

## 2022-06-08 LAB — FERRITIN: Ferritin: 15 ng/mL (ref 11–307)

## 2022-06-08 LAB — CBC: RBC: 4.58 (ref 3.87–5.11)

## 2022-06-08 NOTE — Telephone Encounter (Signed)
Contacted pt to schedule an appt. Unable to reach via phone, voicemail was left.   Scheduling Message Entered by Rennis Harding A on 06/08/2022 at  9:54 AM Priority: Routine <No visit type provided>  Department: [facility]  Provider:  Scheduling Notes:  Labs/scans on 12-08-22  F/u appt on 12-09-22

## 2022-06-09 ENCOUNTER — Ambulatory Visit: Payer: BC Managed Care – PPO | Admitting: Oncology

## 2022-06-09 ENCOUNTER — Other Ambulatory Visit: Payer: BC Managed Care – PPO

## 2022-12-08 LAB — LAB REPORT - SCANNED

## 2022-12-08 NOTE — Progress Notes (Signed)
[facility]  [address] [place],  [place]  [address] [phone]  Clinic Day:   12/09/2022  Referring physician: Lucianne Lei, MD   HISTORY OF PRESENT ILLNESS:  The patient is a 52 y.o. 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  Only 1 mitosis was seen per high powered field, consistent with low grade disease.  Scans done at her last visit showed no evidence of disease recurrence.  The patient comes in today to for routine follow-up, as well as to review her most recent scans.  Since her last visit, the patient has been doing well.  She denies having any GI symptoms which concern her for disease recurrence.     PHYSICAL EXAM:  Blood pressure (!) 137/90, pulse 74, temperature 98.4 F (36.9 C), resp. rate 16, height 5\' 6"  (1.676 m), weight 230 lb (104.3 kg), last menstrual period 06/30/2015, SpO2 100%. Wt Readings from Last 3 Encounters:  12/09/22 230 lb (104.3 kg)  06/08/22 222 lb 4.8 oz (100.8 kg)  05/16/22 225 lb (102.1 kg)   Body mass index is 37.12 kg/m. Performance status (ECOG): 0 - Asymptomatic Physical Exam Constitutional:      Appearance: Normal appearance. She is not ill-appearing.  HENT:     Mouth/Throat:     Mouth: Mucous membranes are moist.     Pharynx: Oropharynx is clear. No oropharyngeal exudate or posterior oropharyngeal erythema.  Cardiovascular:     Rate and Rhythm: Normal rate and regular rhythm.     Heart sounds: No murmur heard.    No friction rub. No gallop.  Pulmonary:     Effort: Pulmonary effort is normal. No respiratory distress.     Breath sounds: Normal breath sounds. No wheezing, rhonchi or rales.  Abdominal:     General: Bowel sounds are normal. There is no distension.     Palpations: Abdomen is soft. There is no mass.     Tenderness: There is no abdominal  tenderness.  Musculoskeletal:        General: No swelling.     Right lower leg: No edema.     Left lower leg: No edema.  Lymphadenopathy:     Cervical: No cervical adenopathy.     Upper Body:     Right upper body: No supraclavicular or axillary adenopathy.     Left upper body: No supraclavicular or axillary adenopathy.     Lower Body: No right inguinal adenopathy. No left inguinal adenopathy.  Skin:    General: Skin is warm.     Coloration: Skin is not jaundiced.     Findings: No lesion or rash.  Neurological:     General: No focal deficit present.     Mental Status: She is alert and oriented to person, place, and time. Mental status is at baseline.  Psychiatric:        Mood and Affect: Mood normal.        Behavior: Behavior normal.        Thought Content: Thought content normal.    SCANS:  CT scans of her abdomen/pelvis revealed the following:  FINDINGS: Lower chest: Unremarkable  Hepatobiliary: Unremarkable  Pancreas: Unremarkable  Spleen: Unremarkable  Adrenals/Urinary Tract: Unremarkable  Stomach/Bowel: Postoperative changes of the stomach. No evidence of bowel obstruction or inflammation.  Vascular/Lymphatic: Unremarkable  Reproductive: Unremarkable  Other:  Musculoskeletal: Unremarkable  IMPRESSION: No evidence of acute intra-abdominal pathology.  LABS:      Latest Reference Range & Units 12/09/22 10:15  Iron 28 - 170 ug/dL 63  UIBC ug/dL 213  TIBC 086 - 578 ug/dL 469  Saturation Ratios 10.4 - 31.8 % 15  Ferritin 11 - 307 ng/mL 23    ASSESSMENT & PLAN:  A 52 y.o. 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  In clinic today, I went over all of her CT scan images with her, for which she could see she remains disease-free.  Overall, she appears to be doing very well.  When evaluating her labs, she does have a mild, but persistent, microcytic anemia.  Her iron studies today show stability in her levels.   Her iron parameters will continue to be followed over time.  Otherwise, as she is doing well, I will see her back in 6 months for repeat clinical assessment. The patient understands all the plans discussed today and is in agreement with them.  Dink Creps Kirby Funk, MD

## 2022-12-09 ENCOUNTER — Inpatient Hospital Stay: Payer: BC Managed Care – PPO

## 2022-12-09 ENCOUNTER — Inpatient Hospital Stay: Payer: BC Managed Care – PPO | Attending: Oncology | Admitting: Oncology

## 2022-12-09 ENCOUNTER — Ambulatory Visit: Payer: BC Managed Care – PPO | Admitting: Oncology

## 2022-12-09 ENCOUNTER — Other Ambulatory Visit: Payer: Self-pay | Admitting: Oncology

## 2022-12-09 ENCOUNTER — Other Ambulatory Visit: Payer: BC Managed Care – PPO

## 2022-12-09 ENCOUNTER — Telehealth: Payer: Self-pay | Admitting: Oncology

## 2022-12-09 VITALS — BP 137/90 | HR 74 | Temp 98.4°F | Resp 16 | Ht 66.0 in | Wt 230.0 lb

## 2022-12-09 DIAGNOSIS — C49A2 Gastrointestinal stromal tumor of stomach: Secondary | ICD-10-CM

## 2022-12-09 DIAGNOSIS — D509 Iron deficiency anemia, unspecified: Secondary | ICD-10-CM | POA: Diagnosis present

## 2022-12-09 LAB — FERRITIN: Ferritin: 23 ng/mL (ref 11–307)

## 2022-12-09 LAB — IRON AND TIBC
Iron: 63 ug/dL (ref 28–170)
Saturation Ratios: 15 % (ref 10.4–31.8)
TIBC: 417 ug/dL (ref 250–450)
UIBC: 354 ug/dL

## 2022-12-09 NOTE — Telephone Encounter (Signed)
02/17/22 Next appt scheduled and confirmed with patient

## 2023-01-09 ENCOUNTER — Encounter: Payer: Self-pay | Admitting: Oncology

## 2023-03-27 ENCOUNTER — Telehealth: Payer: Self-pay | Admitting: Cardiology

## 2023-03-27 NOTE — Telephone Encounter (Signed)
 Patient would like to know if Dr. Tomie China could refer her to another doctor for sleep since Dr. Dalexa Endo will retire soon. Any recommendations?

## 2023-04-11 ENCOUNTER — Ambulatory Visit: Payer: Self-pay | Attending: Cardiology | Admitting: Cardiology

## 2023-04-11 ENCOUNTER — Encounter: Payer: Self-pay | Admitting: Cardiology

## 2023-04-11 VITALS — BP 98/62 | HR 85 | Ht 65.5 in | Wt 227.0 lb

## 2023-04-11 DIAGNOSIS — I10 Essential (primary) hypertension: Secondary | ICD-10-CM | POA: Diagnosis not present

## 2023-04-11 DIAGNOSIS — G4733 Obstructive sleep apnea (adult) (pediatric): Secondary | ICD-10-CM | POA: Diagnosis not present

## 2023-04-11 DIAGNOSIS — E088 Diabetes mellitus due to underlying condition with unspecified complications: Secondary | ICD-10-CM

## 2023-04-11 DIAGNOSIS — E875 Hyperkalemia: Secondary | ICD-10-CM

## 2023-04-11 DIAGNOSIS — E782 Mixed hyperlipidemia: Secondary | ICD-10-CM

## 2023-04-11 NOTE — Patient Instructions (Signed)
 Medication Instructions:  Your physician recommends that you continue on your current medications as directed. Please refer to the Current Medication list given to you today.  *If you need a refill on your cardiac medications before your next appointment, please call your pharmacy*   Lab Work: Your physician recommends that you have labs done in the office today. Your test included  basic metabolic panel.   If you have labs (blood work) drawn today and your tests are completely normal, you will receive your results only by: MyChart Message (if you have MyChart) OR A paper copy in the mail If you have any lab test that is abnormal or we need to change your treatment, we will call you to review the results.   Testing/Procedures: We will order CT coronary calcium score. It will cost $99.00 and iis due at time of scan.  Please call to schedule.       [facility] [address] [place], [place] [address] [phone]    Follow-Up: At [facility], you and your health needs are our priority.  As part of our continuing mission to provide you with exceptional heart care, we have created designated Provider Care Teams.  These Care Teams include your primary Cardiologist (physician) and Advanced Practice Providers (APPs -  Physician Assistants and Nurse Practitioners) who all work together to provide you with the care you need, when you need it.  We recommend signing up for the patient portal called "MyChart".  Sign up information is provided on this After Visit Summary.  MyChart is used to connect with patients for Virtual Visits (Telemedicine).  Patients are able to view lab/test results, encounter notes, upcoming appointments, etc.  Non-urgent messages can be sent to your provider as well.   To learn more about what you can do with MyChart, go to [email or web address].    Your next appointment:  12 months

## 2023-04-11 NOTE — Progress Notes (Signed)
 Cardiology Office Note:    Date:  04/11/2023   ID:  Krista [name], DOB 06-01-1970, MRN [id number]  PCP:  Lucianne Lei, MD  Cardiologist:  Garwin Brothers, MD   Referring MD: Lucianne Lei, MD    ASSESSMENT:    1. Primary hypertension   2. OSA on CPAP   3. Diabetes mellitus due to underlying condition with unspecified complications (HCC)   4. Mixed dyslipidemia    PLAN:    In order of problems listed above:  Primary prevention stressed with the patient.  Importance of compliance with diet medication stressed and patient verbalized standing. She was advised to walk at least half an hour a day 5 days a week and she promises to do so. Essential hypertension: Blood pressure is stable and diet was emphasized. Mixed dyslipidemia and diabetes mellitus and obesity: Weight reduction stressed lifestyle modification urged.  I had an extensive discussion with her about exercise and losing weight.  Globin A1c also is not at goal.  Lipids were not at goal.  She is a diabetic goal LDL must be less than 60. For calcium scoring I discussed CT scan and this will help underlying the importance of taking good care of herself and may become more compliant.  She is agreeable. Hypokalemia: Will get a Chem-7 today. Patient will be seen in follow-up appointment in 6 months or earlier if the patient has any concerns.    Medication Adjustments/Labs and Tests Ordered: Current medicines are reviewed at length with the patient today.  Concerns regarding medicines are outlined above.  Orders Placed This Encounter  Procedures   EKG 12-Lead   No orders of the defined types were placed in this encounter.    Chief Complaint  Patient presents with   Follow-up     History of Present Illness:    Krista [name] is a 53 y.o. female.  Patient has past medical history of primary hypertension, sleep apnea, diabetes mellitus, mixed dyslipidemia and obesity.  She has been evaluated and treated for  hypokalemia also.  She denies any problems at this time.  She leads a sedentary lifestyle.  No chest pain orthopnea or PND.  At the time of my evaluation, the patient is alert awake oriented and in no distress.  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   GERD (gastroesophageal reflux disease)    GIST (gastrointestinal stromal tumor), malignant (HCC)    History of palpitations yrs ago   PVCs - wore holter monitor, negative results   HTN (hypertension) 11/22/2012   Hypertension    Mixed dyslipidemia 04/05/2021   Morbid obesity (HCC) 11/22/2012   OSA on CPAP 11/22/2012   Palpitations    Restless leg syndrome    patient denies this dx   Seasonal allergies    Status post laparoscopic hysterectomy 07/21/2015   STD (sexually transmitted disease)    hx HPV   Stomach cancer (HCC)    SVD (spontaneous vaginal delivery)    x 1   Vulvar cyst  Past Surgical History:  Procedure Laterality Date   CERVICAL BIOPSY  W/ LOOP ELECTRODE EXCISION  2006   colonscopy  09/2019   polyps return in 5 years   COLPOSCOPY  2006   CYSTOSCOPY N/A 07/21/2015   Procedure: CYSTOSCOPY;  Surgeon: Patton Salles, MD;  Location: [facility];  Service: Gynecology;  Laterality: N/A;   ESOPHAGOGASTRODUODENOSCOPY N/A 03/25/2021   Procedure: ESOPHAGOGASTRODUODENOSCOPY (EGD);  Surgeon: Rachael Fee, MD;  Location: [facility];  Service: Endoscopy;  Laterality: N/A;   ESOPHAGOGASTRODUODENOSCOPY (EGD) WITH PROPOFOL N/A 12/12/2019   Procedure: ESOPHAGOGASTRODUODENOSCOPY (EGD) WITH PROPOFOL;  Surgeon: Rachael Fee, MD;  Location: [facility];  Service: Endoscopy;  Laterality: N/A;   EUS N/A 12/12/2019   Procedure: UPPER ENDOSCOPIC ULTRASOUND (EUS) LINEAR;  Surgeon: Rachael Fee, MD;  Location: [facility];  Service: Endoscopy;  Laterality: N/A;   EUS N/A 03/25/2021   Procedure: UPPER ENDOSCOPIC ULTRASOUND (EUS) RADIAL;  Surgeon: Rachael Fee, MD;  Location: [facility];  Service: Endoscopy;  Laterality: N/A;   FINE NEEDLE ASPIRATION N/A 12/12/2019   Procedure: FINE NEEDLE ASPIRATION (FNA) LINEAR;  Surgeon: Rachael Fee, MD;  Location: [facility];  Service: Endoscopy;  Laterality: N/A;   LESION REMOVAL Left 11/11/2019   Procedure: EXCISION OF LEFT VULVAR CYST;  Surgeon: Patton Salles, MD;  Location[facility];  Service: Gynecology;  Laterality: Left;   ROBOTIC ASSISTED TOTAL HYSTERECTOMY WITH SALPINGECTOMY Bilateral 07/21/2015   Procedure: ROBOTIC ASSISTED TOTAL HYSTERECTOMY WITH SALPINGECTOMY;  Surgeon: Patton Salles, MD;  Location: [facility];  Service: Gynecology;  Laterality: Bilateral;   UPPER GI ENDOSCOPY  09/2019   WISDOM TOOTH EXTRACTION  1990's    Current Medications: Current Meds  Medication Sig   acetaminophen (TYLENOL) 500 MG tablet Take 1,000 mg by mouth every 8 (eight) hours as needed for mild pain.   albuterol (VENTOLIN HFA) 108 (90 Base) MCG/ACT inhaler Inhale 2 puffs into the lungs every 4 (four) hours as needed for shortness of breath.   amLODipine (NORVASC) 5 MG tablet TAKE 1 TABLET(5 MG) BY MOUTH DAILY (Patient taking differently: Take 5 mg by mouth daily.)   baclofen (LIORESAL) 10 MG tablet Take 10 mg by mouth daily as needed for muscle spasms.    budesonide-formoterol (SYMBICORT) 160-4.5 MCG/ACT inhaler Inhale 2 puffs into the lungs 2 (two) times daily as needed for shortness of breath.   DULoxetine (CYMBALTA) 60 MG capsule Take 60 mg by mouth daily.    fexofenadine (ALLEGRA) 180 MG tablet Take 180 mg by mouth daily.     fluticasone (FLONASE) 50 MCG/ACT nasal spray Place 2 sprays into both nostrils daily.    JANUVIA 100 MG tablet Take 100 mg by mouth daily.   losartan (COZAAR) 100 MG tablet Take 100 mg by mouth daily.   montelukast (SINGULAIR) 10 MG tablet Take 10 mg by mouth at bedtime.    Multiple Vitamin (MULTIVITAMIN) capsule Take 1 capsule by mouth daily.   nitroGLYCERIN (NITROSTAT) 0.4 MG SL tablet Place 0.4 mg under the tongue as needed for chest pain.   OZEMPIC, 0.25 OR 0.5 MG/DOSE, 2 MG/3ML SOPN Inject 0.5 mg into the skin once a week.   Potassium Chloride ER 20 MEQ TBCR Take 1 tablet by mouth daily.   propranolol ER (INDERAL LA) 160 MG SR capsule Take 160 mg by mouth daily.   PROTONIX 40 MG tablet Take 40 mg by mouth as needed (indigestion, reflux).   rosuvastatin (CRESTOR)  5 MG tablet Take 5 mg by mouth at bedtime.    Vitamin D, Ergocalciferol, (DRISDOL) 50000 UNITS CAPS capsule Take 50,000 Units by mouth every Thursday.    [DISCONTINUED] losartan-hydrochlorothiazide (HYZAAR) 100-25 MG tablet Take 1 tablet by mouth daily.    [DISCONTINUED] potassium chloride (KLOR-CON) 10 MEQ tablet Take 10 mEq by mouth daily.   [DISCONTINUED] TRULICITY 3 MG/0.5ML SOPN Inject 3 mg into the skin once a week.     Allergies:   Ace inhibitors, Elemental sulfur, and Penicillins   Social History   Socioeconomic History   Marital status: Married    Spouse name: TONY   Number of children: 1   Years of education: 12 + 4   Highest education level: Not on file  Occupational History   Occupation: [employer] MANAGER  Tobacco Use   Smoking status: Never   Smokeless tobacco: Never  Vaping Use   Vaping status: Never Used  Substance and Sexual Activity   Alcohol use: Yes    Comment: 1 drink/month   Drug use: No   Sexual activity: Yes    Partners: Male    Birth control/protection: Surgical    Comment: R-TLH/Bil.salingectomy  Other Topics Concern   Not on file  Social History Narrative   Not on file   Social Drivers  of Health   Financial Resource Strain: Not on file  Food Insecurity: Not on file  Transportation Needs: Not on file  Physical Activity: Not on file  Stress: Not on file  Social Connections: Not on file     Family History: The patient's family history includes Alzheimer's disease in her paternal grandfather; Breast cancer in her cousin and paternal aunt; Cancer in her maternal aunt; Diabetes in her father and paternal grandmother; Hypertension in her father, maternal grandmother, mother, and paternal grandmother; Ovarian cancer in her maternal grandmother; Stroke in her maternal grandfather.  ROS:   Please see the history of present illness.    All other systems reviewed and are negative.  EKGs/Labs/Other Studies Reviewed:    The following studies were reviewed today: I discussed findings of the test with her at length   Recent Labs: 06/08/2022: ALT 21; BUN 10; Creatinine 0.72; Hemoglobin 11.7; Platelets 287; Potassium 3.3; Sodium 138  Recent Lipid Panel No results found for: "CHOL", "TRIG", "HDL", "CHOLHDL", "VLDL", "LDLCALC", "LDLDIRECT"  Physical Exam:    VS:  BP 98/62 (BP Location: Left Arm, Patient Position: Sitting)   Pulse 85   Ht 5' 5.5" (1.664 m)   Wt 227 lb (103 kg)   LMP 06/30/2015 (Approximate)   SpO2 95%   BMI 37.20 kg/m     Wt Readings from Last 3 Encounters:  04/11/23 227 lb (103 kg)  12/09/22 230 lb (104.3 kg)  06/08/22 222 lb 4.8 oz (100.8 kg)     GEN: Patient is in no acute distress HEENT: Normal NECK: No JVD; No carotid bruits LYMPHATICS: No lymphadenopathy CARDIAC: Hear sounds regular, 2/6 systolic murmur at the apex. RESPIRATORY:  Clear to auscultation without rales, wheezing or rhonchi  ABDOMEN: Soft, non-tender, non-distended MUSCULOSKELETAL:  No edema; No deformity  SKIN: Warm and dry NEUROLOGIC:  Alert and oriented x 3 PSYCHIATRIC:  Normal affect   Signed, Garwin Brothers, MD  04/11/2023 3:32 PM    [facility]

## 2023-04-12 LAB — BASIC METABOLIC PANEL
BUN/Creatinine Ratio: 18 (ref 9–23)
BUN: 20 mg/dL (ref 6–24)
CO2: 24 mmol/L (ref 20–29)
Calcium: 9.5 mg/dL (ref 8.7–10.2)
Chloride: 102 mmol/L (ref 96–106)
Creatinine, Ser: 1.14 mg/dL — ABNORMAL HIGH (ref 0.57–1.00)
Glucose: 108 mg/dL — ABNORMAL HIGH (ref 70–99)
Potassium: 3.8 mmol/L (ref 3.5–5.2)
Sodium: 141 mmol/L (ref 134–144)
eGFR: 58 mL/min/{1.73_m2} — ABNORMAL LOW (ref >59)

## 2023-04-18 ENCOUNTER — Ambulatory Visit (HOSPITAL_BASED_OUTPATIENT_CLINIC_OR_DEPARTMENT_OTHER)
Admission: RE | Admit: 2023-04-18 | Discharge: 2023-04-18 | Disposition: A | Discharge status: Home / Self Care | Payer: Self-pay | Source: Ambulatory Visit | Attending: Cardiology | Admitting: Cardiology

## 2023-04-18 DIAGNOSIS — I10 Essential (primary) hypertension: Secondary | ICD-10-CM

## 2023-04-18 DIAGNOSIS — E782 Mixed hyperlipidemia: Secondary | ICD-10-CM

## 2023-04-21 ENCOUNTER — Telehealth: Payer: Self-pay

## 2023-04-21 DIAGNOSIS — E782 Mixed hyperlipidemia: Secondary | ICD-10-CM

## 2023-04-21 NOTE — Telephone Encounter (Signed)
-----   Message from Aundra Dubin Revankar sent at 04/21/2023  1:34 PM EDT ----- Need rpt liver only ----- Message ----- From: Eleonore Chiquito, RN Sent: 04/21/2023   8:00 AM EDT To: Garwin Brothers, MD  Labs were done 03/28/23 in [facility]. ----- Message ----- From: Garwin Brothers, MD Sent: 04/20/2023  10:55 AM EDT To: Eleonore Chiquito, RN  Please bring patient in for Chem-7 liver lipid check.  Noncardiac evaluation by radiologist is awaited.  Copy primary Garwin Brothers, MD 04/20/2023 10:54 AM

## 2023-04-21 NOTE — Telephone Encounter (Signed)
 Mychart message

## 2023-06-07 NOTE — Progress Notes (Unsigned)
 [name]  [address] [place],  [place]  [address] [phone]  Clinic Day:   06/08/2023  Referring physician: Tamela Fake, MD   HISTORY OF PRESENT ILLNESS:  The patient is a 53 y.o. female with a low-grade GIST, status post a partial gastrectomy in May 2023.  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  Only 1 mitosis was seen per high powered field, consistent with low grade disease.  Scans done at her last visit showed no evidence of disease recurrence.  The patient comes in today to for routine follow-up.  Since her last visit, the patient has been doing well.  She denies having any GI symptoms which concern her for disease recurrence.     PHYSICAL EXAM:  Blood pressure 135/78, pulse 70, temperature 98.1 F (36.7 C), temperature source Oral, resp. rate 16, height 5' 5.5" (1.664 m), weight 225 lb 8 oz (102.3 kg), last menstrual period 06/30/2015, SpO2 100%. Wt Readings from Last 3 Encounters:  06/08/23 225 lb 8 oz (102.3 kg)  04/11/23 227 lb (103 kg)  12/09/22 230 lb (104.3 kg)   Body mass index is 36.95 kg/m. Performance status (ECOG): 0 - Asymptomatic Physical Exam Constitutional:      Appearance: Normal appearance. She is not ill-appearing.  HENT:     Mouth/Throat:     Mouth: Mucous membranes are moist.     Pharynx: Oropharynx is clear. No oropharyngeal exudate or posterior oropharyngeal erythema.  Cardiovascular:     Rate and Rhythm: Normal rate and regular rhythm.     Heart sounds: No murmur heard.    No friction rub. No gallop.  Pulmonary:     Effort: Pulmonary effort is normal. No respiratory distress.     Breath sounds: Normal breath sounds. No wheezing, rhonchi or rales.  Abdominal:     General: Bowel sounds are normal. There is no distension.     Palpations: Abdomen is soft. There is no mass.     Tenderness: There is no abdominal tenderness.  Musculoskeletal:        General: No  swelling.     Right lower leg: No edema.     Left lower leg: No edema.  Lymphadenopathy:     Cervical: No cervical adenopathy.     Upper Body:     Right upper body: No supraclavicular or axillary adenopathy.     Left upper body: No supraclavicular or axillary adenopathy.     Lower Body: No right inguinal adenopathy. No left inguinal adenopathy.  Skin:    General: Skin is warm.     Coloration: Skin is not jaundiced.     Findings: No lesion or rash.  Neurological:     General: No focal deficit present.     Mental Status: She is alert and oriented to person, place, and time. Mental status is at baseline.  Psychiatric:        Mood and Affect: Mood normal.        Behavior: Behavior normal.        Thought Content: Thought content normal.    LABS:  Latest Reference Range & Units 06/08/23 09:09  Sodium 135 - 145 mmol/L 142  Potassium 3.5 - 5.1 mmol/L 3.6  Chloride 98 - 111 mmol/L 104  CO2 22 - 32 mmol/L 27  Glucose 70 - 99 mg/dL 259 (H)  BUN 6 - 20 mg/dL 14  Creatinine 5.63 - 8.75 mg/dL 6.43  Calcium 8.9 - 10.3  mg/dL 9.6  Anion gap 5 - 15  11  Alkaline Phosphatase 38 - 126 U/L 116  Albumin 3.5 - 5.0 g/dL 3.8  AST 15 - 41 U/L 19  ALT 0 - 44 U/L 20  Total Protein 6.5 - 8.1 g/dL 7.2  Total Bilirubin 0.0 - 1.2 mg/dL 0.4  GFR, Est Non African American >60 mL/min >60  WBC 4.0 - 10.5 K/uL 6.5  RBC 3.87 - 5.11 MIL/uL 4.34  Hemoglobin 12.0 - 15.0 g/dL 16.1 (L)  HCT 09.6 - 04.5 % 34.8 (L)  MCV 80.0 - 100.0 fL 80.2  MCH 26.0 - 34.0 pg 25.6 (L)  MCHC 30.0 - 36.0 g/dL 40.9  RDW 81.1 - 91.4 % 15.0  Platelets 150 - 400 K/uL 262  nRBC 0.0 - 0.2 % 0.00  Neutrophils % 61  Lymphocytes % 29  Monocytes Relative % 6  Eosinophil % 3  Basophil % 1  Immature Granulocytes % 0  NEUT# 1.7 - 7.7 K/uL 3.9  Lymphs Abs 0.7 - 4.0 K/uL 1.9  Monocyte # 0.1 - 1.0 K/uL 0.4  Eosinophils Absolute 0.0 - 0.5 K/uL 0.2  Basophils Absolute 0.0 - 0.1 K/uL 0.0  Abs Immature Granulocytes 0.00 - 0.07 K/uL 0.02   (H): Data is abnormally high (L): Data is abnormally low   Latest Reference Range & Units 06/08/23 09:09  Iron 28 - 170 ug/dL 45  UIBC ug/dL 782  TIBC 956 - 213 ug/dL 086  Saturation Ratios 10.4 - 31.8 % 12  Ferritin 11 - 307 ng/mL 18   ASSESSMENT & PLAN:  A 53 y.o. 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  There is nothing clinically or per her physical exam which is concerning for disease recurrence.  Her hemoglobin is slightly low, but stable.  Her iron parameters have been trending downwards, as has her MCV.  Based upon this and the fact that she has had a partial gastrectomy, I will arrange for her to receive IV iron over these next few weeks to replenish her iron stores and improve her hemoglobin.  Otherwise, as she is doing well, I will see her back in 6 months for repeat clinical assessment. CT scans will be done a day before her next visit to ensure there is no radiographic evidence of disease recurrence.  I will also refer her to GI to undergo an EGD to ensure there is no evidence of local disease recurrence.  The patient understands all the plans discussed today and is in agreement with them.  Lyndon Chenoweth Felicia Horde, MD

## 2023-06-08 ENCOUNTER — Telehealth: Payer: Self-pay | Admitting: Oncology

## 2023-06-08 ENCOUNTER — Other Ambulatory Visit: Payer: Self-pay | Admitting: Oncology

## 2023-06-08 ENCOUNTER — Inpatient Hospital Stay: Payer: BC Managed Care – PPO

## 2023-06-08 ENCOUNTER — Inpatient Hospital Stay: Payer: BC Managed Care – PPO | Attending: Oncology | Admitting: Oncology

## 2023-06-08 VITALS — BP 135/78 | HR 70 | Temp 98.1°F | Resp 16 | Ht 65.5 in | Wt 225.5 lb

## 2023-06-08 DIAGNOSIS — C49A2 Gastrointestinal stromal tumor of stomach: Secondary | ICD-10-CM

## 2023-06-08 DIAGNOSIS — D649 Anemia, unspecified: Secondary | ICD-10-CM | POA: Diagnosis not present

## 2023-06-08 DIAGNOSIS — Z8509 Personal history of malignant neoplasm of other digestive organs: Secondary | ICD-10-CM | POA: Insufficient documentation

## 2023-06-08 DIAGNOSIS — D509 Iron deficiency anemia, unspecified: Secondary | ICD-10-CM

## 2023-06-08 LAB — CBC WITH DIFFERENTIAL (CANCER CENTER ONLY)
Abs Immature Granulocytes: 0.02 10*3/uL (ref 0.00–0.07)
Basophils Absolute: 0 10*3/uL (ref 0.0–0.1)
Basophils Relative: 1 %
Eosinophils Absolute: 0.2 10*3/uL (ref 0.0–0.5)
Eosinophils Relative: 3 %
HCT: 34.8 % — ABNORMAL LOW (ref 36.0–46.0)
Hemoglobin: 11.1 g/dL — ABNORMAL LOW (ref 12.0–15.0)
Immature Granulocytes: 0 %
Lymphocytes Relative: 29 %
Lymphs Abs: 1.9 10*3/uL (ref 0.7–4.0)
MCH: 25.6 pg — ABNORMAL LOW (ref 26.0–34.0)
MCHC: 31.9 g/dL (ref 30.0–36.0)
MCV: 80.2 fL (ref 80.0–100.0)
Monocytes Absolute: 0.4 10*3/uL (ref 0.1–1.0)
Monocytes Relative: 6 %
Neutro Abs: 3.9 10*3/uL (ref 1.7–7.7)
Neutrophils Relative %: 61 %
Platelet Count: 262 10*3/uL (ref 150–400)
RBC: 4.34 MIL/uL (ref 3.87–5.11)
RDW: 15 % (ref 11.5–15.5)
WBC Count: 6.5 10*3/uL (ref 4.0–10.5)
nRBC: 0 % (ref 0.0–0.2)

## 2023-06-08 LAB — CMP (CANCER CENTER ONLY)
ALT: 20 U/L (ref 0–44)
AST: 19 U/L (ref 15–41)
Albumin: 3.8 g/dL (ref 3.5–5.0)
Alkaline Phosphatase: 116 U/L (ref 38–126)
Anion gap: 11 (ref 5–15)
BUN: 14 mg/dL (ref 6–20)
CO2: 27 mmol/L (ref 22–32)
Calcium: 9.6 mg/dL (ref 8.9–10.3)
Chloride: 104 mmol/L (ref 98–111)
Creatinine: 0.76 mg/dL (ref 0.44–1.00)
GFR, Estimated: >60 mL/min (ref >60)
Glucose, Bld: 129 mg/dL — ABNORMAL HIGH (ref 70–99)
Potassium: 3.6 mmol/L (ref 3.5–5.1)
Sodium: 142 mmol/L (ref 135–145)
Total Bilirubin: 0.4 mg/dL (ref 0.0–1.2)
Total Protein: 7.2 g/dL (ref 6.5–8.1)

## 2023-06-08 LAB — IRON AND TIBC
Iron: 45 ug/dL (ref 28–170)
Saturation Ratios: 12 % (ref 10.4–31.8)
TIBC: 378 ug/dL (ref 250–450)
UIBC: 333 ug/dL

## 2023-06-08 LAB — FERRITIN: Ferritin: 18 ng/mL (ref 11–307)

## 2023-06-08 NOTE — Telephone Encounter (Signed)
 Patient has been scheduled for follow-up visit per 06/08/23 LOS.  Pt given an appt calendar with date and time.

## 2023-06-09 ENCOUNTER — Telehealth: Payer: Self-pay

## 2023-06-09 NOTE — Telephone Encounter (Signed)
 Dr Harles Lied: pt is iron def. let pt know; she needs iv iron scheduled .   Latest Reference Range & Units 06/08/23 09:09  Iron 28 - 170 ug/dL 45  UIBC ug/dL 161  TIBC 096 - 045 ug/dL 409  Saturation Ratios 10.4 - 31.8 % 12  Ferritin 11 - 307 ng/mL 18

## 2023-06-12 ENCOUNTER — Telehealth: Payer: Self-pay | Admitting: Oncology

## 2023-06-12 ENCOUNTER — Encounter: Payer: Self-pay | Admitting: Oncology

## 2023-06-12 DIAGNOSIS — D509 Iron deficiency anemia, unspecified: Secondary | ICD-10-CM | POA: Insufficient documentation

## 2023-06-12 NOTE — Telephone Encounter (Signed)
 Patient has been scheduled for follow-up visit per 06/12/23 LOS.  Pt aware of scheduled appt details.

## 2023-06-14 ENCOUNTER — Inpatient Hospital Stay

## 2023-06-14 VITALS — BP 120/68 | HR 75 | Temp 98.0°F | Resp 16

## 2023-06-14 DIAGNOSIS — Z8509 Personal history of malignant neoplasm of other digestive organs: Secondary | ICD-10-CM | POA: Diagnosis not present

## 2023-06-14 DIAGNOSIS — D509 Iron deficiency anemia, unspecified: Secondary | ICD-10-CM

## 2023-06-14 MED ORDER — SODIUM CHLORIDE 0.9% FLUSH: 10.0000 mL | Freq: Once | INTRAVENOUS | Status: DC | PRN

## 2023-06-14 MED ORDER — IRON SUCROSE 20 MG/ML IV SOLN
200.0000 mg | Freq: Once | INTRAVENOUS | Status: AC
  Administered 2023-06-14: 200 mg via INTRAVENOUS
  Filled 2023-06-14: qty 10

## 2023-06-14 NOTE — Patient Instructions (Signed)

## 2023-06-16 ENCOUNTER — Encounter: Payer: Self-pay | Admitting: Oncology

## 2023-06-16 ENCOUNTER — Inpatient Hospital Stay

## 2023-06-16 VITALS — BP 116/69 | HR 79 | Temp 98.4°F | Resp 20

## 2023-06-16 DIAGNOSIS — D509 Iron deficiency anemia, unspecified: Secondary | ICD-10-CM

## 2023-06-16 DIAGNOSIS — Z8509 Personal history of malignant neoplasm of other digestive organs: Secondary | ICD-10-CM | POA: Diagnosis not present

## 2023-06-16 MED ORDER — IRON SUCROSE 20 MG/ML IV SOLN
200.0000 mg | Freq: Once | INTRAVENOUS | Status: AC
Start: 2023-06-16 — End: 2023-06-16
  Administered 2023-06-16: 200 mg via INTRAVENOUS
  Filled 2023-06-16: qty 10

## 2023-06-16 MED ORDER — SODIUM CHLORIDE 0.9% FLUSH
10.0000 mL | Freq: Once | INTRAVENOUS | Status: AC | PRN
Start: 2023-06-16 — End: 2023-06-16
  Administered 2023-06-16: 10 mL

## 2023-06-16 NOTE — Patient Instructions (Signed)

## 2023-06-19 ENCOUNTER — Inpatient Hospital Stay

## 2023-06-19 VITALS — BP 110/70 | HR 84 | Temp 98.1°F | Resp 18

## 2023-06-19 DIAGNOSIS — D509 Iron deficiency anemia, unspecified: Secondary | ICD-10-CM

## 2023-06-19 DIAGNOSIS — Z8509 Personal history of malignant neoplasm of other digestive organs: Secondary | ICD-10-CM | POA: Diagnosis not present

## 2023-06-19 MED ORDER — SODIUM CHLORIDE 0.9 % IV SOLN: INTRAVENOUS | Status: DC

## 2023-06-19 MED ORDER — IRON SUCROSE 20 MG/ML IV SOLN
200.0000 mg | Freq: Once | INTRAVENOUS | Status: AC
  Administered 2023-06-19: 200 mg via INTRAVENOUS
  Filled 2023-06-19: qty 10

## 2023-06-19 NOTE — Patient Instructions (Signed)

## 2023-06-21 ENCOUNTER — Inpatient Hospital Stay

## 2023-06-21 VITALS — BP 102/61 | HR 81 | Temp 97.8°F | Resp 18

## 2023-06-21 DIAGNOSIS — Z8509 Personal history of malignant neoplasm of other digestive organs: Secondary | ICD-10-CM | POA: Diagnosis not present

## 2023-06-21 DIAGNOSIS — D509 Iron deficiency anemia, unspecified: Secondary | ICD-10-CM

## 2023-06-21 MED ORDER — IRON SUCROSE 20 MG/ML IV SOLN
200.0000 mg | Freq: Once | INTRAVENOUS | Status: AC
  Administered 2023-06-21: 200 mg via INTRAVENOUS
  Filled 2023-06-21: qty 10

## 2023-06-21 NOTE — Patient Instructions (Signed)

## 2023-06-23 ENCOUNTER — Inpatient Hospital Stay

## 2023-06-23 VITALS — BP 101/70 | HR 77 | Temp 98.2°F | Resp 18 | Ht 65.5 in

## 2023-06-23 DIAGNOSIS — D509 Iron deficiency anemia, unspecified: Secondary | ICD-10-CM

## 2023-06-23 DIAGNOSIS — Z8509 Personal history of malignant neoplasm of other digestive organs: Secondary | ICD-10-CM | POA: Diagnosis not present

## 2023-06-23 MED ORDER — IRON SUCROSE 20 MG/ML IV SOLN
200.0000 mg | Freq: Once | INTRAVENOUS | Status: AC
  Administered 2023-06-23: 200 mg via INTRAVENOUS
  Filled 2023-06-23: qty 10

## 2023-06-23 NOTE — Patient Instructions (Signed)

## 2023-08-10 ENCOUNTER — Encounter: Payer: Self-pay | Admitting: Oncology

## 2023-09-15 ENCOUNTER — Encounter: Payer: Self-pay | Admitting: Oncology

## 2023-09-19 ENCOUNTER — Ambulatory Visit (INDEPENDENT_AMBULATORY_CARE_PROVIDER_SITE_OTHER)

## 2023-09-19 ENCOUNTER — Encounter: Payer: Self-pay | Admitting: Podiatry

## 2023-09-19 ENCOUNTER — Ambulatory Visit: Admitting: Podiatry

## 2023-09-19 DIAGNOSIS — M7752 Other enthesopathy of left foot: Secondary | ICD-10-CM | POA: Diagnosis not present

## 2023-09-19 DIAGNOSIS — M722 Plantar fascial fibromatosis: Secondary | ICD-10-CM

## 2023-09-19 DIAGNOSIS — M21612 Bunion of left foot: Secondary | ICD-10-CM | POA: Diagnosis not present

## 2023-09-19 MED ORDER — TRIAMCINOLONE ACETONIDE 10 MG/ML IJ SUSP: 10.0000 mg | Freq: Once | INTRAMUSCULAR | Status: AC

## 2023-09-19 NOTE — Progress Notes (Unsigned)
 Chief Complaint  Patient presents with   Foot Pain    Left foot, pain and burning in the bunion. Some pain in the heel at times.  Will hurt with walking. Last A1c 7.9 in June. No anti coag.     HPI: 53 y.o. female presents today with several pedal complaints involving the left foot.  Primary issue is localized pain and swelling to the dorsal midfoot which she locates around the location of the EHL tendon.  Been going on for approximately 3 to 4 weeks at this point.  Also  complains of bunions on the left for which does cause her some irritation with shoes over the medial bump.  Intermittently she does get some heel pain as well.  She is diabetic, last A1c 7.9.  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   GERD (gastroesophageal reflux disease)    GIST (gastrointestinal stromal tumor), malignant (HCC)    History of palpitations yrs ago   PVCs - wore holter monitor, negative results   HTN (hypertension) 11/22/2012   Hypertension    Mixed dyslipidemia 04/05/2021   Morbid obesity (HCC) 11/22/2012   OSA on CPAP 11/22/2012   Palpitations    Restless leg syndrome    patient denies this dx   Seasonal allergies    Status post laparoscopic hysterectomy 07/21/2015   STD (sexually transmitted disease)    hx HPV   Stomach cancer (HCC)    SVD (spontaneous vaginal delivery)    x 1   Vulvar cyst     Past Surgical History:  Procedure Laterality Date   CERVICAL BIOPSY  W/ LOOP ELECTRODE EXCISION  2006   colonscopy  09/2019   polyps return in 5 years   COLPOSCOPY  2006   CYSTOSCOPY  N/A 07/21/2015   Procedure: CYSTOSCOPY;  Surgeon: Bobie FORBES Cathlyn JAYSON Nikki, MD;  Location: [facility];  Service: Gynecology;  Laterality: N/A;   ESOPHAGOGASTRODUODENOSCOPY N/A 03/25/2021   Procedure: ESOPHAGOGASTRODUODENOSCOPY (EGD);  Surgeon: Teressa Toribio SQUIBB, MD;  Location: [facility];  Service: Endoscopy;  Laterality: N/A;   ESOPHAGOGASTRODUODENOSCOPY (EGD) WITH PROPOFOL  N/A 12/12/2019   Procedure: ESOPHAGOGASTRODUODENOSCOPY (EGD) WITH PROPOFOL ;  Surgeon: Teressa Toribio SQUIBB, MD;  Location: [facility];  Service: Endoscopy;  Laterality: N/A;   EUS N/A 12/12/2019   Procedure: UPPER ENDOSCOPIC ULTRASOUND (EUS) LINEAR;  Surgeon: Teressa Toribio SQUIBB, MD;  Location: [facility];  Service: Endoscopy;  Laterality: N/A;   EUS N/A 03/25/2021   Procedure: UPPER ENDOSCOPIC ULTRASOUND (EUS) RADIAL;  Surgeon: Teressa Toribio SQUIBB, MD;  Location: [facility];  Service: Endoscopy;  Laterality: N/A;   FINE NEEDLE ASPIRATION N/A 12/12/2019   Procedure: FINE NEEDLE ASPIRATION (FNA) LINEAR;  Surgeon: Teressa Toribio SQUIBB, MD;  Location: [facility];  Service: Endoscopy;  Laterality: N/A;   LESION REMOVAL Left 11/11/2019   Procedure: EXCISION OF LEFT VULVAR CYST;  Surgeon: Cathlyn JAYSON Nikki Bobie FORBES, MD;  Location: [facility];  Service: Gynecology;  Laterality: Left;   ROBOTIC ASSISTED  TOTAL HYSTERECTOMY WITH SALPINGECTOMY Bilateral 07/21/2015   Procedure: ROBOTIC ASSISTED TOTAL HYSTERECTOMY WITH SALPINGECTOMY;  Surgeon: Bobie FORBES Cathlyn JAYSON Nikki, MD;  Location: [facility];  Service: Gynecology;  Laterality: Bilateral;   UPPER GI ENDOSCOPY  09/2019   WISDOM TOOTH EXTRACTION  1990's    Allergies  Allergen Reactions   Ace Inhibitors Cough   Elemental Sulfur Itching and Rash    CAN WE CLARIFY WHAT MEDICATION IT WAS?   Penicillins Itching, Swelling and Rash    Has patient had a PCN reaction causing immediate rash, facial/tongue/throat swelling, SOB or lightheadedness with hypotension: Yes Has patient had a PCN  reaction causing severe rash involving mucus membranes or skin necrosis: Yes Has patient had a PCN reaction that required hospitalization No Has patient had a PCN reaction occurring within the last 10 years: No If all of the above answers are NO, then may proceed with Cephalosporin use.     ROS denies any nausea, vomiting, fever, chills, chest pain, shortness of breath   Physical Exam: There were no vitals filed for this visit.  General: The patient is alert and oriented x3 in no acute distress.  Dermatology: Skin is warm, dry and supple bilateral lower extremities. Interspaces are clear of maceration and debris.    Vascular: Palpable pedal pulses bilaterally. Capillary refill within normal limits.  No diffuse appreciable edema.  No erythema or calor.  Neurological: Light touch sensation grossly intact bilateral feet.   Musculoskeletal Exam: Muscle strength 5/5 in dorsiflexion, plantarflexion, inversion, eversion.  Tenderness on palpation of left EHL tendon dorsally level of first metatarsal.  Some tenderness palpation of medial first met head left foot where there is prominent medial eminence with mild to moderate bunion deformity.  No reproducible heel pain today.  Bunion is reducible.  Decreased first MPJ range of motion.  Pes planus foot type bilaterally.  Radiographic Exam: Left foot 3 views weightbearing 09/19/2023 Pes planus foot type noted.  Moderate bunion deformity present with valgus drift of first toe.  Associated hammertoe contractures.  No acute fractures noted.  There are some calcaneal spur formation present.  Intermetatarsal angle approximately 14 degrees with some met adductus as well.  Assessment/Plan of Care: 1. Bunion, left   2. Tendinitis of left foot   3. Plantar fasciitis, left      Meds ordered this encounter  Medications   triamcinolone  acetonide (KENALOG ) 10 MG/ML injection 10 mg   None  Discussed clinical findings with patient today.  # EHL  tendinitis left foot Discussed the etiology and treatment options for left EHL tendinitis.  We discussed that these types of injuries are overuse injuries and respond well to anti-inflammatories, rest, immobilization.  I reviewed RICE protocol with the patient.  Recommend the following treatment plan:  -Immobilization with Cam boot -Oral anti-inflammatories prescribed: Cannot tolerate oral NSAIDs -Recommended topical diclofenac gel 1% to apply daily 3-4 times on the painful areas -Recommended twice daily stretching through these exercises reviewed with the patient and an informational handout was given to the patient.  Verbal consent was obtained tenderness or corticosteroid injection left foot level of EHL tendon and first metatarsal.  Alcohol skin prep.  Injected 0.5 cc of 0.5% Marcaine  plain mixed with 10 mg Kenalog .  Band-Aid applied.  Patient tolerated injection well.   # Left foot bunion -Radiographs reviewed with patient - Discussed conservative modalities including shoe modification, bunion shields, orthotics, topical NSAIDs, corticosteroid injections.  Did briefly discuss surgical treatment as well. - Plan to proceed with conservative  treatment at this point.  # Left heel pain, plantar fasciitis - Mild at this point -Will have some benefit from the cam boot immobilization -Stretching exercises discussed with patient at length with written instructions dispensed  Reevaluate in 3 weeks   Kaiulani Sitton L. Lamount MAUL, AACFAS [facility]     [address] [place], [place] [address]                Office [phone]  Fax [phone]

## 2023-09-19 NOTE — Patient Instructions (Addendum)
 [email or web address]  Plantar Fasciitis (Heel Spur Syndrome) with Rehab The plantar fascia is a fibrous, ligament-like, soft-tissue structure that spans the bottom of the foot. Plantar fasciitis is a condition that causes pain in the foot due to inflammation of the tissue. SYMPTOMS  Pain and tenderness on the underneath side of the foot. Pain that worsens with standing or walking. CAUSES  Plantar fasciitis is caused by irritation and injury to the plantar fascia on the underneath side of the foot. Common mechanisms of injury include: Direct trauma to bottom of the foot. Damage to a small nerve that runs under the foot where the main fascia attaches to the heel bone. Stress placed on the plantar fascia due to bone spurs. RISK INCREASES WITH:  Activities that place stress on the plantar fascia (running, jumping, pivoting, or cutting). Poor strength and flexibility. Improperly fitted shoes. Tight calf muscles. Flat feet. Failure to warm-up properly before activity. Obesity. PREVENTION Warm up and stretch properly before activity. Allow for adequate recovery between workouts. Maintain physical fitness: Strength, flexibility, and endurance. Cardiovascular fitness. Maintain a health body weight. Avoid stress on the plantar fascia. Wear properly fitted shoes, including arch supports for individuals who have flat feet.  PROGNOSIS  If treated properly, then the symptoms of plantar fasciitis usually resolve without surgery. However, occasionally surgery is necessary.  RELATED COMPLICATIONS  Recurrent symptoms that may result in a chronic condition. Problems of the lower back that are caused by compensating for the injury, such as limping. Pain or weakness of the foot during push-off following surgery. Chronic inflammation, scarring, and partial or complete fascia tear, occurring more often from repeated  injections.  TREATMENT  Treatment initially involves the use of ice and medication to help reduce pain and inflammation. The use of strengthening and stretching exercises may help reduce pain with activity, especially stretches of the Achilles tendon. These exercises may be performed at home or with a therapist. Your caregiver may recommend that you use heel cups of arch supports to help reduce stress on the plantar fascia. Occasionally, corticosteroid injections are given to reduce inflammation. If symptoms persist for greater than 6 months despite non-surgical (conservative), then surgery may be recommended.   MEDICATION  If pain medication is necessary, then nonsteroidal anti-inflammatory medications, such as aspirin and ibuprofen , or other minor pain relievers, such as acetaminophen , are often recommended. Do not take pain medication within 7 days before surgery. Prescription pain relievers may be given if deemed necessary by your caregiver. Use only as directed and only as much as you need. Corticosteroid injections may be given by your caregiver. These injections should be reserved for the most serious cases, because they may only be given a certain number of times.  HEAT AND COLD Cold treatment (icing) relieves pain and reduces inflammation. Cold treatment should be applied for 10 to 15 minutes every 2 to 3 hours for inflammation and pain and immediately after any activity that aggravates your symptoms. Use ice packs or massage the area with a piece of ice (ice massage). Heat treatment may be used prior to performing the stretching and strengthening activities prescribed by your caregiver, physical therapist, or athletic trainer. Use a heat pack or soak the injury in warm water .  SEEK IMMEDIATE MEDICAL CARE IF: Treatment seems to offer no benefit, or the condition worsens. Any medications produce adverse side effects.  EXERCISES- RANGE OF MOTION (ROM) AND STRETCHING EXERCISES - Plantar  Fasciitis (Heel Spur Syndrome) These exercises may help you when beginning to rehabilitate your  injury. Your symptoms may resolve with or without further involvement from your physician, physical therapist or athletic trainer. While completing these exercises, remember:  Restoring tissue flexibility helps normal motion to return to the joints. This allows healthier, less painful movement and activity. An effective stretch should be held for at least 30 seconds. A stretch should never be painful. You should only feel a gentle lengthening or release in the stretched tissue.  RANGE OF MOTION - Toe Extension, Flexion Sit with your right / left leg crossed over your opposite knee. Grasp your toes and gently pull them back toward the top of your foot. You should feel a stretch on the bottom of your toes and/or foot. Hold this stretch for 10 seconds. Now, gently pull your toes toward the bottom of your foot. You should feel a stretch on the top of your toes and or foot. Hold this stretch for 10 seconds. Repeat  times. Complete this stretch 3 times per day.   RANGE OF MOTION - Ankle Dorsiflexion, Active Assisted Remove shoes and sit on a chair that is preferably not on a carpeted surface. Place right / left foot under knee. Extend your opposite leg for support. Keeping your heel down, slide your right / left foot back toward the chair until you feel a stretch at your ankle or calf. If you do not feel a stretch, slide your bottom forward to the edge of the chair, while still keeping your heel down. Hold this stretch for 10 seconds. Repeat 3 times. Complete this stretch 2 times per day.   STRETCH  Gastroc, Standing Place hands on wall. Extend right / left leg, keeping the front knee somewhat bent. Slightly point your toes inward on your back foot. Keeping your right / left heel on the floor and your knee straight, shift your weight toward the wall, not allowing your back to arch. You should feel a  gentle stretch in the right / left calf. Hold this position for 10 seconds. Repeat 3 times. Complete this stretch 2 times per day.  STRETCH  Soleus, Standing Place hands on wall. Extend right / left leg, keeping the other knee somewhat bent. Slightly point your toes inward on your back foot. Keep your right / left heel on the floor, bend your back knee, and slightly shift your weight over the back leg so that you feel a gentle stretch deep in your back calf. Hold this position for 10 seconds. Repeat 3 times. Complete this stretch 2 times per day.  STRETCH  Gastrocsoleus, Standing  Note: This exercise can place a lot of stress on your foot and ankle. Please complete this exercise only if specifically instructed by your caregiver.  Place the ball of your right / left foot on a step, keeping your other foot firmly on the same step. Hold on to the wall or a rail for balance. Slowly lift your other foot, allowing your body weight to press your heel down over the edge of the step. You should feel a stretch in your right / left calf. Hold this position for 10 seconds. Repeat this exercise with a slight bend in your right / left knee. Repeat 3 times. Complete this stretch 2 times per day.   STRENGTHENING EXERCISES - Plantar Fasciitis (Heel Spur Syndrome)  These exercises may help you when beginning to rehabilitate your injury. They may resolve your symptoms with or without further involvement from your physician, physical therapist or athletic trainer. While completing these exercises, remember:  Muscles can gain both the endurance and the strength needed for everyday activities through controlled exercises. Complete these exercises as instructed by your physician, physical therapist or athletic trainer. Progress the resistance and repetitions only as guided.  STRENGTH - Towel Curls Sit in a chair positioned on a non-carpeted surface. Place your foot on a towel, keeping your heel on the  floor. Pull the towel toward your heel by only curling your toes. Keep your heel on the floor. Repeat 3 times. Complete this exercise 2 times per day.  STRENGTH - Ankle Inversion Secure one end of a rubber exercise band/tubing to a fixed object (table, pole). Loop the other end around your foot just before your toes. Place your fists between your knees. This will focus your strengthening at your ankle. Slowly, pull your big toe up and in, making sure the band/tubing is positioned to resist the entire motion. Hold this position for 10 seconds. Have your muscles resist the band/tubing as it slowly pulls your foot back to the starting position. Repeat 3 times. Complete this exercises 2 times per day.  Document Released: 01/17/2005 Document Revised: 04/11/2011 Document Reviewed: 05/01/2008 [facility] Patient Information 2014 [place], [place].

## 2023-10-04 IMAGING — MG MM DIGITAL SCREENING BILAT W/ TOMO AND CAD
8 series · 8 of 24 positions shown · non-contrast
Comparison: Previous exam(s).

CLINICAL DATA: Screening.

EXAM:
DIGITAL SCREENING BILATERAL MAMMOGRAM WITH TOMOSYNTHESIS AND CAD
TECHNIQUE: Bilateral screening digital craniocaudal and mediolateral oblique
mammograms were obtained. Bilateral screening digital breast
tomosynthesis was performed. The images were evaluated with
computer-aided detection.

[R CC synth-2D]
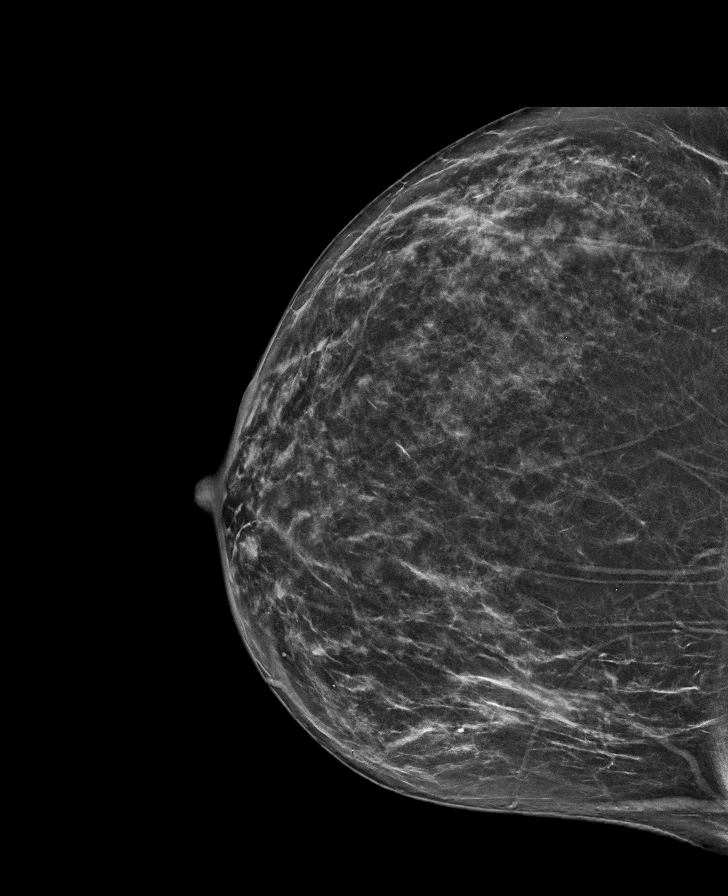

[L MLO synth-2D]
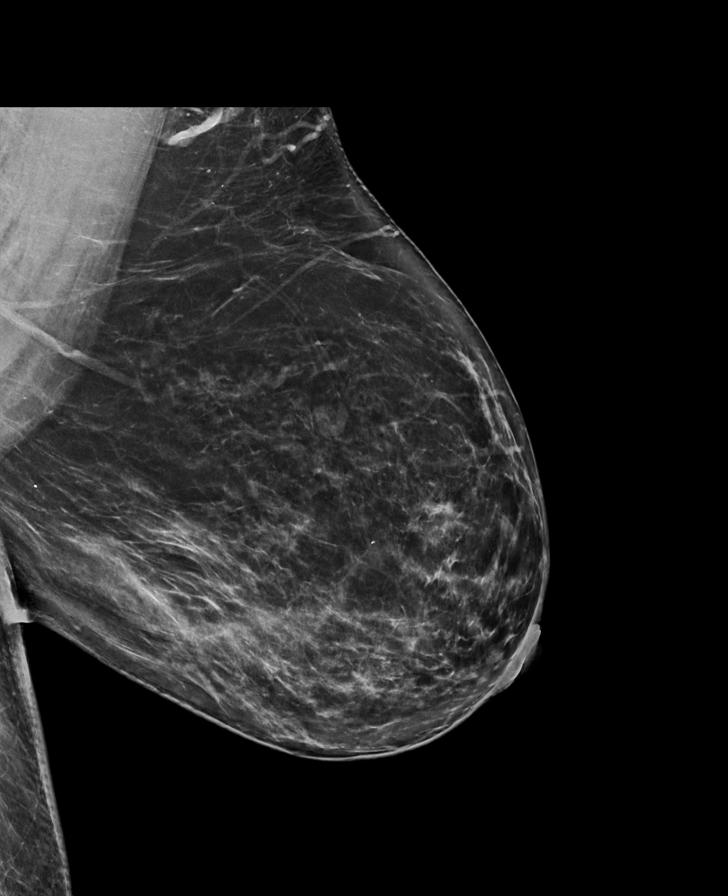

[R MLO synth-2D]
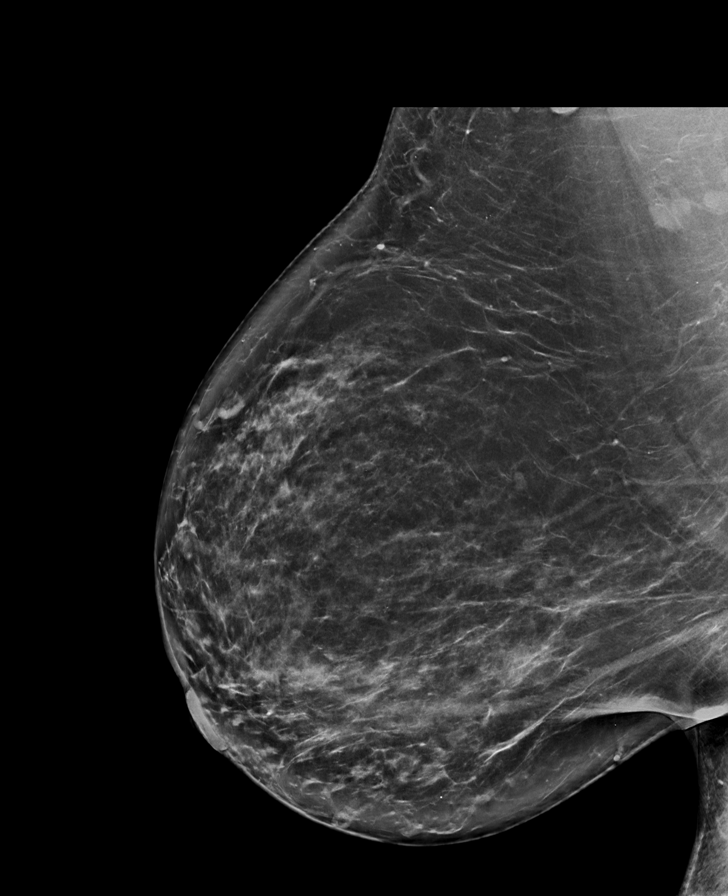

[L CC synth-2D]
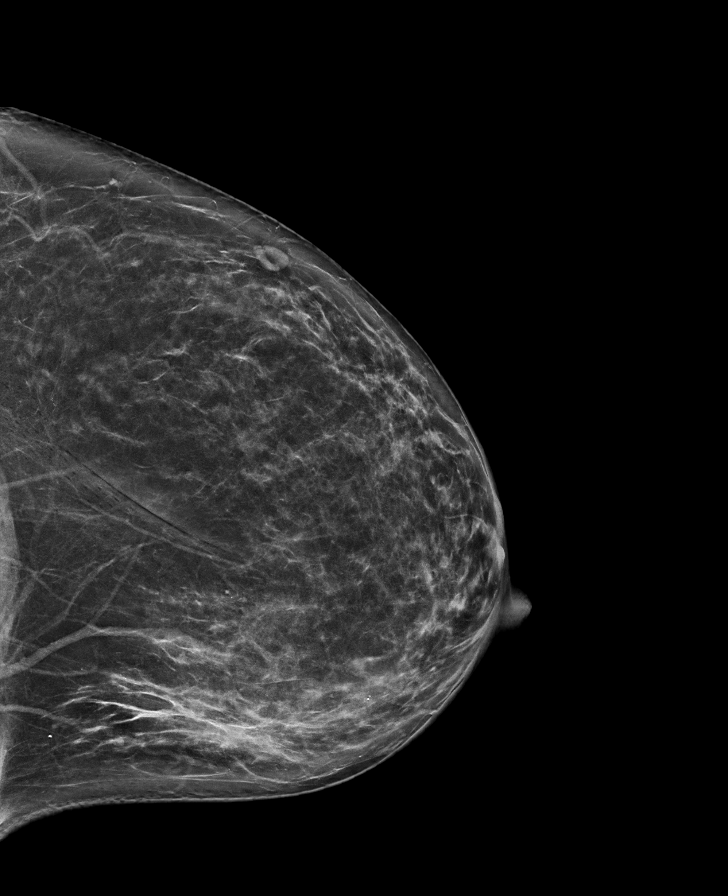

[R MLO tomo · tomo slice 49/97.0]
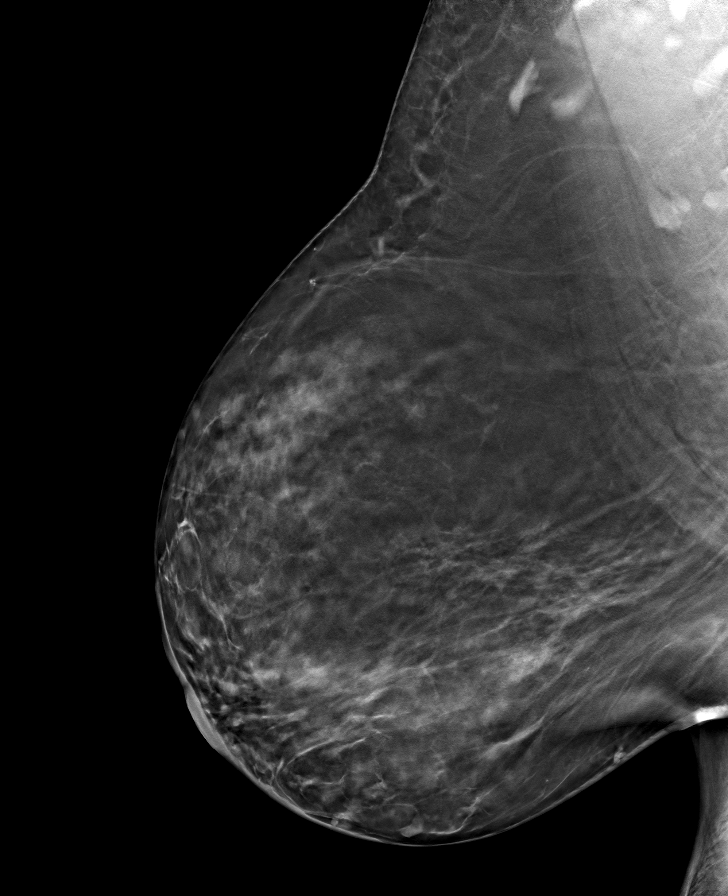

[R CC tomo · tomo slice 37/73.0]
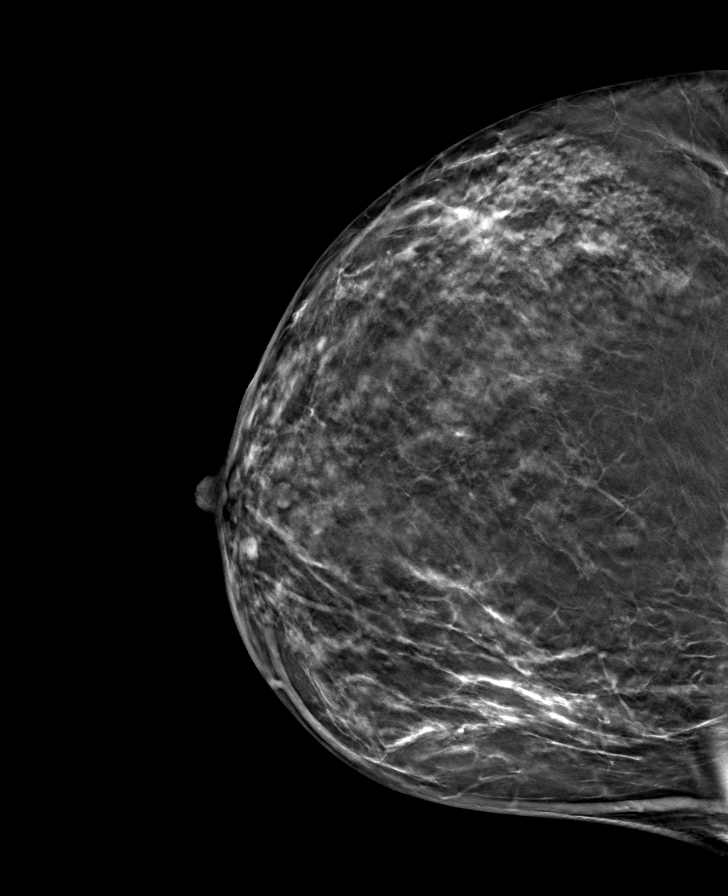

[L CC tomo · tomo slice 39/76.0]
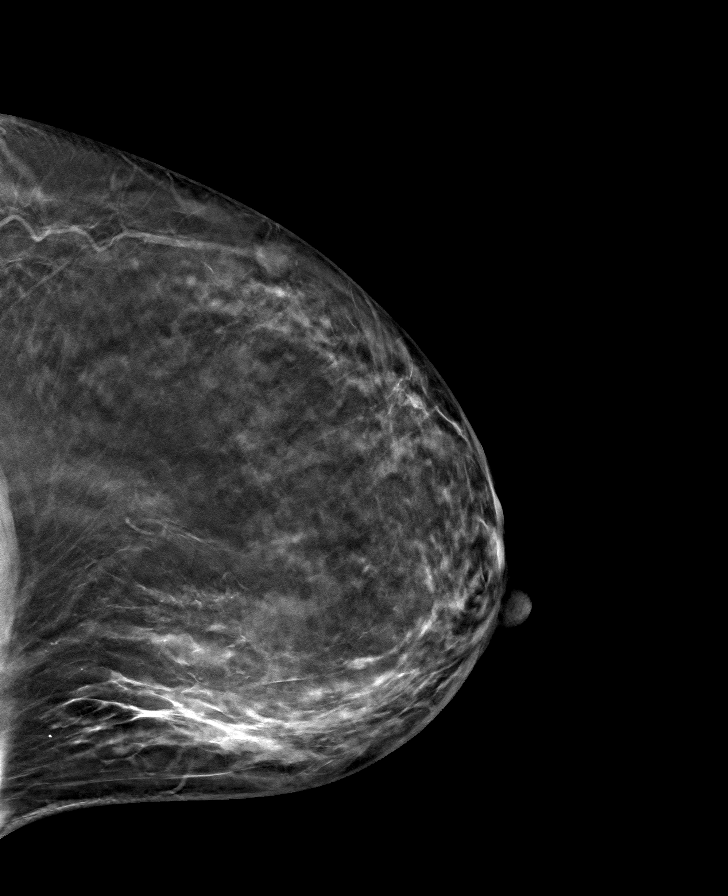

[L MLO tomo · tomo slice 45/89.0]
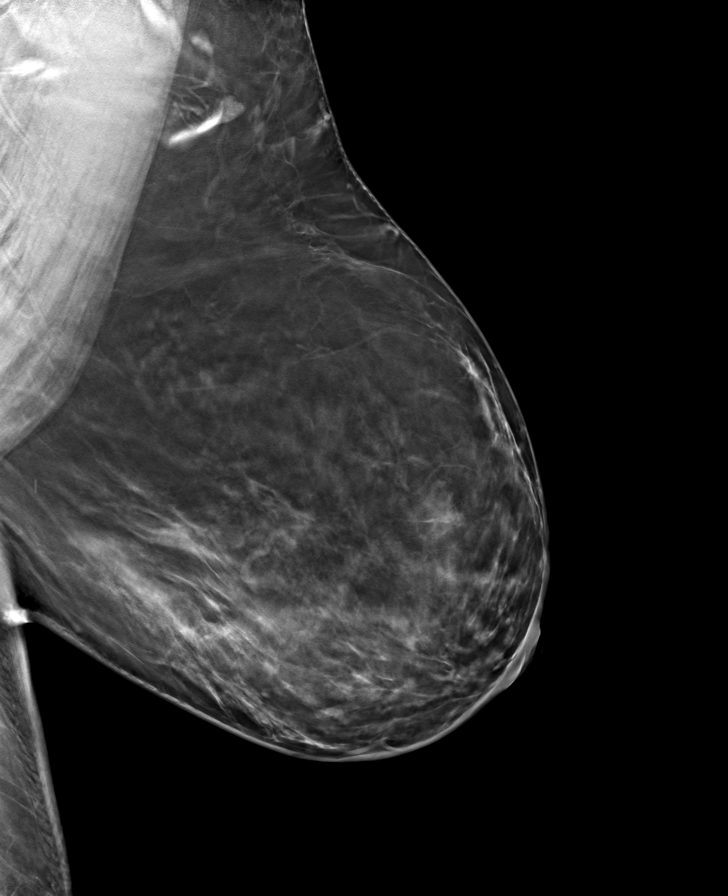

[8 of 24 positions shown; findings below may reference images not displayed]

ACR Breast Density Category b: There are scattered areas of
fibroglandular density.
FINDINGS: There are no findings suspicious for malignancy.
IMPRESSION: No mammographic evidence of malignancy. A result letter of this
screening mammogram will be mailed directly to the patient.

RECOMMENDATION:
Screening mammogram in one year. (Code:51-O-LD2)

BI-RADS CATEGORY  1: Negative.

## 2023-10-10 ENCOUNTER — Ambulatory Visit: Admitting: Podiatry

## 2023-11-23 ENCOUNTER — Telehealth: Payer: Self-pay | Admitting: Cardiology

## 2023-11-23 ENCOUNTER — Ambulatory Visit: Attending: Cardiology | Admitting: Cardiology

## 2023-11-23 ENCOUNTER — Encounter: Payer: Self-pay | Admitting: Cardiology

## 2023-11-23 VITALS — BP 102/50 | HR 111 | Ht 65.0 in | Wt 216.8 lb

## 2023-11-23 DIAGNOSIS — R42 Dizziness and giddiness: Secondary | ICD-10-CM | POA: Diagnosis not present

## 2023-11-23 DIAGNOSIS — E782 Mixed hyperlipidemia: Secondary | ICD-10-CM | POA: Diagnosis not present

## 2023-11-23 DIAGNOSIS — I10 Essential (primary) hypertension: Secondary | ICD-10-CM | POA: Diagnosis not present

## 2023-11-23 DIAGNOSIS — G4733 Obstructive sleep apnea (adult) (pediatric): Secondary | ICD-10-CM | POA: Diagnosis not present

## 2023-11-23 DIAGNOSIS — R931 Abnormal findings on diagnostic imaging of heart and coronary circulation: Secondary | ICD-10-CM | POA: Insufficient documentation

## 2023-11-23 DIAGNOSIS — E669 Obesity, unspecified: Secondary | ICD-10-CM | POA: Insufficient documentation

## 2023-11-23 DIAGNOSIS — E088 Diabetes mellitus due to underlying condition with unspecified complications: Secondary | ICD-10-CM

## 2023-11-23 MED ORDER — LOSARTAN POTASSIUM 100 MG PO TABS: 100.0000 mg | ORAL_TABLET | Freq: Every day | ORAL | 3 refills | Status: AC

## 2023-11-23 NOTE — Patient Instructions (Addendum)
 Medication Instructions:  Your physician has recommended you make the following change in your medication:   STOP: Amlodipine  STOP: Hyzaar START: Losartan  100 mg daily ( start tomorrow) STOP: Potasium Chloride  Take Propanolol (Inderal ) in the am  *If you need a refill on your cardiac medications before your next appointment, please call your pharmacy*  Lab Work: None If you have labs (blood work) drawn today and your tests are completely normal, you will receive your results only by: MyChart Message (if you have MyChart) OR A paper copy in the mail If you have any lab test that is abnormal or we need to change your treatment, we will call you to review the results.  Testing/Procedures: None  Follow-Up: At [facility], you and your health needs are our priority.  As part of our continuing mission to provide you with exceptional heart care, our providers are all part of one team.  This team includes your primary Cardiologist (physician) and Advanced Practice Providers or APPs (Physician Assistants and Nurse Practitioners) who all work together to provide you with the care you need, when you need it.  Your next appointment:   6 month(s)  Provider:   Jennifer Crape, MD    We recommend signing up for the patient portal called MyChart.  Sign up information is provided on this After Visit Summary.  MyChart is used to connect with patients for Virtual Visits (Telemedicine).  Patients are able to view lab/test results, encounter notes, upcoming appointments, etc.  Non-urgent messages can be sent to your provider as well.   To learn more about what you can do with MyChart, go to [email or web address].   Other Instructions  Nurse visit in 2 weeks to review pulse and blood pressure log and draw a BMP  Please keep a BP log for 2 weeks and send by MyChart or mail.                          Name and DOB__________________________ Dr. Crape [address] [place], [place]  [address]  Blood Pressure Record Sheet To take your blood pressure, you will need a blood pressure machine. You can buy a blood pressure machine (blood pressure monitor) at your clinic, drug store, or online. When choosing one, consider: An automatic monitor that has an arm cuff. A cuff that wraps snugly around your upper arm. You should be able to fit only one finger between your arm and the cuff. A device that stores blood pressure reading results. Do not choose a monitor that measures your blood pressure from your wrist or finger. Follow your health care provider's instructions for how to take your blood pressure. To use this form: Get one reading in the morning (a.m.) 1-2 hours after you take any medicines. Get one reading in the evening (p.m.) before supper.   Blood pressure log Date: _______________________  a.m. _____________________(1st reading) HR___________            p.m. _____________________(2nd reading) HR__________  Date: _______________________  a.m. _____________________(1st reading) HR___________            p.m. _____________________(2nd reading) HR__________  Date: _______________________  a.m. _____________________(1st reading) HR___________            p.m. _____________________(2nd reading) HR__________  Date: _______________________  a.m. _____________________(1st reading) HR___________            p.m. _____________________(2nd reading) HR__________  Date: _______________________  a.m. _____________________(1st reading) HR___________  p.m. _____________________(2nd reading) HR__________  Date: _______________________  a.m. _____________________(1st reading) HR___________            p.m. _____________________(2nd reading) HR__________  Date: _______________________  a.m. _____________________(1st reading) HR___________            p.m. _____________________(2nd reading) HR__________   This information is not intended to replace advice  given to you by your health care provider. Make sure you discuss any questions you have with your health care provider. Document Revised: 05/08/2019 Document Reviewed: 05/08/2019 Elsevier Patient Education  2021 [employer].

## 2023-11-23 NOTE — Telephone Encounter (Signed)
 Pt called complaining of fatigue, lightheaded and dizzy spells when she bends over. She requested appt with Revankar. Pt has recently lost weight and feels her BP medication may be causing symptoms, needs management.

## 2023-11-23 NOTE — Telephone Encounter (Signed)
 Pt has an appointment today with Dr. Revankar.

## 2023-11-23 NOTE — Progress Notes (Signed)
 Cardiology Office Note:    Date:  11/23/2023   ID:  Krista [name] [place], DOB 1970-10-06, MRN [id number]  PCP:  Krista Cambric, MD  Cardiologist:  Krista JONELLE Crape, MD   Referring MD: Krista Cambric, MD    ASSESSMENT:    1. Mixed dyslipidemia   2. Primary hypertension   3. Dizziness   4. OSA on CPAP   5. Diabetes mellitus due to underlying condition with unspecified complications (HCC)   6. Obesity (BMI 35.0-39.9 without comorbidity)   7. Elevated coronary artery calcium score    PLAN:    In order of problems listed above:  Primary prevention stressed with the patient.  Importance of compliance with diet medication stressed and patient verbalized standing. She was advised to walk at least 2 miles a day on a regular basis and she agrees. Essential hypertension: Blood pressure is borderline.  I have discontinued amlodipine .  She has not taken the medicine yesterday.  I also stopped her hydrochlorothiazide  and potassium.  She will take only losartan  every night.  She will take a beta-blocker in the morning.  She will keep a track of pulse blood pressures.  She will be here in follow-up appointment in 2 weeks for nurse visit.  At that time she will have a Chem-7 and a liver lipid check. Mixed dyslipidemia: On lipid-lowering medications followed by primary care.  Goal LDL less than 60. Diabetes mellitus and obesity: Weight reduction stressed diet emphasized and she promises to do better. Patient will be seen in follow-up appointment in 6 months or earlier if the patient has any concerns.    Medication Adjustments/Labs and Tests Ordered: Current medicines are reviewed at length with the patient today.  Concerns regarding medicines are outlined above.  Orders Placed This Encounter  Procedures   EKG 12-Lead   No orders of the defined types were placed in this encounter.    No chief complaint on file.    History of Present Illness:    Krista [name] is a 53 y.o. female.   Patient has past medical history of mildly elevated calcium score, essential hypertension, mixed dyslipidemia, diabetes mellitus and obesity.  She has history of sleep apnea.  She denies any problems except she has symptoms suggestive of postural hypotension.  Her blood pressure is borderline.  At the time of my evaluation, the patient is alert awake oriented and in no distress.  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   GERD (gastroesophageal reflux disease)    GIST (gastrointestinal stromal tumor), malignant (HCC)    History of palpitations yrs ago   PVCs - wore holter monitor, negative results   HTN (hypertension) 11/22/2012   Hypertension    Mixed dyslipidemia 04/05/2021   Morbid obesity (HCC) 11/22/2012   OSA on CPAP 11/22/2012   Palpitations    Restless leg syndrome    patient denies this dx   Seasonal allergies    Status post laparoscopic hysterectomy 07/21/2015   STD (sexually transmitted disease)  hx HPV   Stomach cancer (HCC)    SVD (spontaneous vaginal delivery)    x 1   Vulvar cyst     Past Surgical History:  Procedure Laterality Date   CERVICAL BIOPSY  W/ LOOP ELECTRODE EXCISION  2006   colonscopy  09/2019   polyps return in 5 years   COLPOSCOPY  2006   CYSTOSCOPY N/A 07/21/2015   Procedure: CYSTOSCOPY;  Surgeon: Krista FORBES Cathlyn JAYSON Nikki, MD;  Location: [facility];  Service: Gynecology;  Laterality: N/A;   ESOPHAGOGASTRODUODENOSCOPY N/A 03/25/2021   Procedure: ESOPHAGOGASTRODUODENOSCOPY (EGD);  Surgeon: Krista Toribio SQUIBB, MD;  Location: [facility];  Service:  Endoscopy;  Laterality: N/A;   ESOPHAGOGASTRODUODENOSCOPY (EGD) WITH PROPOFOL  N/A 12/12/2019   Procedure: ESOPHAGOGASTRODUODENOSCOPY (EGD) WITH PROPOFOL ;  Surgeon: Krista Toribio SQUIBB, MD;  Location: [facility];  Service: Endoscopy;  Laterality: N/A;   EUS N/A 12/12/2019   Procedure: UPPER ENDOSCOPIC ULTRASOUND (EUS) LINEAR;  Surgeon: Krista Toribio SQUIBB, MD;  Location: [facility];  Service: Endoscopy;  Laterality: N/A;   EUS N/A 03/25/2021   Procedure: UPPER ENDOSCOPIC ULTRASOUND (EUS) RADIAL;  Surgeon: Krista Toribio SQUIBB, MD;  Location: [facility];  Service: Endoscopy;  Laterality: N/A;   FINE NEEDLE ASPIRATION N/A 12/12/2019   Procedure: FINE NEEDLE ASPIRATION (FNA) LINEAR;  Surgeon: Krista Toribio SQUIBB, MD;  Location: [facility];  Service: Endoscopy;  Laterality: N/A;   LESION REMOVAL Left 11/11/2019   Procedure: EXCISION OF LEFT VULVAR CYST;  Surgeon: Cathlyn JAYSON [name] Krista FORBES, MD;  Location: [facility];  Service: Gynecology;  Laterality: Left;   ROBOTIC ASSISTED TOTAL HYSTERECTOMY WITH SALPINGECTOMY Bilateral 07/21/2015   Procedure: ROBOTIC ASSISTED TOTAL HYSTERECTOMY WITH SALPINGECTOMY;  Surgeon: Krista FORBES Cathlyn JAYSON Nikki, MD;  Location: [facility];  Service: Gynecology;  Laterality: Bilateral;   UPPER GI ENDOSCOPY  09/2019   WISDOM TOOTH EXTRACTION  1990's    Current Medications: Current Meds  Medication Sig   ACCU-CHEK GUIDE TEST test strip 2 (two) times daily.   acetaminophen  (TYLENOL ) 500 MG tablet Take 1,000 mg by mouth every 8 (eight) hours as needed for mild pain.   albuterol (VENTOLIN HFA) 108 (90 Base) MCG/ACT inhaler Inhale 2 puffs into the lungs every 4 (four) hours as needed for shortness of breath.   amLODipine  (NORVASC ) 5 MG tablet TAKE 1 TABLET(5 MG) BY MOUTH DAILY (Patient taking differently: Take 5 mg by mouth daily.)   baclofen (LIORESAL) 10 MG tablet Take 10 mg by mouth daily as needed for muscle spasms.    budesonide-formoterol (SYMBICORT) 160-4.5 MCG/ACT  inhaler Inhale 2 puffs into the lungs 2 (two) times daily as needed for shortness of breath.   Continuous Glucose Sensor (DEXCOM G7 SENSOR) MISC SMARTSIG:1 Topical Every 10 Days   DULoxetine  (CYMBALTA ) 60 MG capsule Take 60 mg by mouth daily.    EASY TOUCH PEN NEEDLES 32G X 5 MM MISC    fexofenadine (ALLEGRA) 180 MG tablet Take 180 mg by mouth daily.    fluticasone (FLONASE) 50 MCG/ACT nasal spray Place 2 sprays into both nostrils daily.    losartan -hydrochlorothiazide  (HYZAAR) 100-25 MG tablet Take 1 tablet by mouth daily.   montelukast  (SINGULAIR ) 10 MG tablet Take 10 mg by mouth at bedtime.    Multiple Vitamin (MULTIVITAMIN) capsule Take 1 capsule by mouth daily.   nitroGLYCERIN (NITROSTAT) 0.4 MG SL tablet Place 0.4 mg under the tongue as needed for chest pain.   OZEMPIC, 2 MG/DOSE, 8 MG/3ML SOPN Inject 2 mg into the skin once a  week.   Potassium Chloride  ER 20 MEQ TBCR Take 1 tablet by mouth daily.   propranolol  ER (INDERAL  LA) 160 MG SR capsule Take 160 mg by mouth daily.   PROTONIX 40 MG tablet Take 40 mg by mouth as needed (indigestion, reflux).   rosuvastatin (CRESTOR) 10 MG tablet Take 10 mg by mouth at bedtime.   TRESIBA FLEXTOUCH 200 UNIT/ML FlexTouch Pen INJECT 10 UNITS DAILY FOR 1 WEEK THEN TITRATE DOSE BY 2 UNITS EVERY 3 DAYS UNTIL MORNING FASTING SUGAR IS AROUND 120. MAX DAILY DOSE 50 UNITS   Vitamin D, Ergocalciferol, (DRISDOL) 50000 UNITS CAPS capsule Take 50,000 Units by mouth every Thursday.    Current Facility-Administered Medications for the 11/23/23 encounter (Office Visit) with Gunda Maqueda, Krista SAUNDERS, MD  Medication   triamcinolone  acetonide (KENALOG ) 10 MG/ML injection 10 mg     Allergies:   Ace inhibitors, Elemental sulfur, and Penicillins   Social History   Socioeconomic History   Marital status: Married    Spouse name: TONY   Number of children: 1   Years of education: 12 + 4   Highest education level: Not on file  Occupational History   Occupation: [employer]  MANAGER  Tobacco Use   Smoking status: Never   Smokeless tobacco: Never  Vaping Use   Vaping status: Never Used  Substance and Sexual Activity   Alcohol use: Yes    Comment: 1 drink/month   Drug use: No   Sexual activity: Yes    Partners: Male    Birth control/protection: Surgical    Comment: R-TLH/Bil.salingectomy  Other Topics Concern   Not on file  Social History Narrative   Not on file   Social Drivers of Health   Financial Resource Strain: Not on file  Food Insecurity: Not on file  Transportation Needs: Not on file  Physical Activity: Not on file  Stress: Not on file  Social Connections: Not on file     Family History: The patient's family history includes Alzheimer's disease in her paternal grandfather; Breast cancer in her cousin and paternal aunt; Cancer in her maternal aunt; Diabetes in her father and paternal grandmother; Hypertension in her father, maternal grandmother, mother, and paternal grandmother; Ovarian cancer in her maternal grandmother; Stroke in her maternal grandfather.  ROS:   Please see the history of present illness.    All other systems reviewed and are negative.  EKGs/Labs/Other Studies Reviewed:    The following studies were reviewed today: .SABRAEKG Interpretation Date/Time:  Thursday November 23 2023 14:37:50 EDT Ventricular Rate:  111 PR Interval:  122 QRS Duration:  80 QT Interval:  354 QTC Calculation: 481 R Axis:   3  Text Interpretation: Sinus tachycardia with frequent Premature ventricular complexes Possible Anterior infarct (cited on or before 07-Nov-2019) Abnormal ECG When compared with ECG of 11-Apr-2023 15:18, Premature ventricular complexes are now Present Confirmed by Edwyna Krista [phone]) on 11/23/2023 2:40:49 PM     Recent Labs: 06/08/2023: ALT 20; BUN 14; Creatinine 0.76; Hemoglobin 11.1; Platelet Count 262; Potassium 3.6; Sodium 142  Recent Lipid Panel No results found for: CHOL, TRIG, HDL, CHOLHDL, VLDL,  LDLCALC, LDLDIRECT  Physical Exam:    VS:  BP (!) 102/50   Pulse (!) 111   Ht 5' 5 (1.651 m)   Wt 216 lb 12.8 oz (98.3 kg)   LMP 06/30/2015 (Approximate)   SpO2 99%   BMI 36.08 kg/m     Wt Readings from Last 3 Encounters:  11/23/23 216 lb 12.8 oz (98.3 kg)  06/08/23 225 lb 8 oz (102.3 kg)  04/11/23 227 lb (103 kg)     GEN: Patient is in no acute distress HEENT: Normal NECK: No JVD; No carotid bruits LYMPHATICS: No lymphadenopathy CARDIAC: Hear sounds regular, 2/6 systolic murmur at the apex. RESPIRATORY:  Clear to auscultation without rales, wheezing or rhonchi  ABDOMEN: Soft, non-tender, non-distended MUSCULOSKELETAL:  No edema; No deformity  SKIN: Warm and dry NEUROLOGIC:  Alert and oriented x 3 PSYCHIATRIC:  Normal affect   Signed, Krista JONELLE Crape, MD  11/23/2023 2:53 PM    [facility]

## 2023-12-04 ENCOUNTER — Inpatient Hospital Stay: Attending: Oncology

## 2023-12-04 DIAGNOSIS — C49A2 Gastrointestinal stromal tumor of stomach: Secondary | ICD-10-CM

## 2023-12-04 DIAGNOSIS — Z8509 Personal history of malignant neoplasm of other digestive organs: Secondary | ICD-10-CM | POA: Insufficient documentation

## 2023-12-04 DIAGNOSIS — D509 Iron deficiency anemia, unspecified: Secondary | ICD-10-CM | POA: Diagnosis not present

## 2023-12-04 LAB — CMP (CANCER CENTER ONLY)
ALT: 26 U/L (ref 0–44)
AST: 23 U/L (ref 15–41)
Albumin: 3.9 g/dL (ref 3.5–5.0)
Alkaline Phosphatase: 113 U/L (ref 38–126)
Anion gap: 9 (ref 5–15)
BUN: 10 mg/dL (ref 6–20)
CO2: 28 mmol/L (ref 22–32)
Calcium: 9 mg/dL (ref 8.9–10.3)
Chloride: 107 mmol/L (ref 98–111)
Creatinine: 0.67 mg/dL (ref 0.44–1.00)
GFR, Estimated: >60 mL/min (ref >60)
Glucose, Bld: 106 mg/dL — ABNORMAL HIGH (ref 70–99)
Potassium: 3.4 mmol/L — ABNORMAL LOW (ref 3.5–5.1)
Sodium: 143 mmol/L (ref 135–145)
Total Bilirubin: 0.5 mg/dL (ref 0.0–1.2)
Total Protein: 6.9 g/dL (ref 6.5–8.1)

## 2023-12-04 LAB — CBC WITH DIFFERENTIAL (CANCER CENTER ONLY)
Abs Immature Granulocytes: 0.02 K/uL (ref 0.00–0.07)
Basophils Absolute: 0 K/uL (ref 0.0–0.1)
Basophils Relative: 1 %
Eosinophils Absolute: 0.2 K/uL (ref 0.0–0.5)
Eosinophils Relative: 3 %
HCT: 37.2 % (ref 36.0–46.0)
Hemoglobin: 11.9 g/dL — ABNORMAL LOW (ref 12.0–15.0)
Immature Granulocytes: 0 %
Lymphocytes Relative: 34 %
Lymphs Abs: 2 K/uL (ref 0.7–4.0)
MCH: 26.6 pg (ref 26.0–34.0)
MCHC: 32 g/dL (ref 30.0–36.0)
MCV: 83.2 fL (ref 80.0–100.0)
Monocytes Absolute: 0.3 K/uL (ref 0.1–1.0)
Monocytes Relative: 6 %
Neutro Abs: 3.3 K/uL (ref 1.7–7.7)
Neutrophils Relative %: 56 %
Platelet Count: 232 K/uL (ref 150–400)
RBC: 4.47 MIL/uL (ref 3.87–5.11)
RDW: 13.7 % (ref 11.5–15.5)
WBC Count: 5.8 K/uL (ref 4.0–10.5)
nRBC: 0 % (ref 0.0–0.2)

## 2023-12-04 LAB — IRON AND TIBC
Iron: 55 ug/dL (ref 28–170)
Saturation Ratios: 20 % (ref 10.4–31.8)
TIBC: 280 ug/dL (ref 250–450)
UIBC: 225 ug/dL

## 2023-12-04 LAB — FERRITIN: Ferritin: 199 ng/mL (ref 11–307)

## 2023-12-05 ENCOUNTER — Ambulatory Visit (INDEPENDENT_AMBULATORY_CARE_PROVIDER_SITE_OTHER)
Admission: RE | Admit: 2023-12-05 | Discharge: 2023-12-05 | Disposition: A | Discharge status: Home / Self Care | Source: Ambulatory Visit | Attending: Oncology | Admitting: Oncology

## 2023-12-05 ENCOUNTER — Inpatient Hospital Stay: Payer: Self-pay

## 2023-12-05 DIAGNOSIS — C49A2 Gastrointestinal stromal tumor of stomach: Secondary | ICD-10-CM

## 2023-12-05 MED ORDER — IOHEXOL 300 MG/ML  SOLN
100.0000 mL | Freq: Once | INTRAMUSCULAR | Status: AC | PRN
  Administered 2023-12-05: 100 mL via INTRAVENOUS

## 2023-12-07 ENCOUNTER — Other Ambulatory Visit

## 2023-12-07 ENCOUNTER — Ambulatory Visit: Attending: Internal Medicine

## 2023-12-07 NOTE — Progress Notes (Unsigned)
 [facility] [address] [place],  [place]  [address] [phone]  Clinic Day:   06/08/2023  Referring physician: Gable Cambric, MD   HISTORY OF PRESENT ILLNESS:  The patient is a 53 y.o. female with a low-grade GIST, status post a partial gastrectomy in May 2023.  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  Only 1 mitosis was seen per high powered field, consistent with low grade disease.  Scans done at her last visit showed no evidence of disease recurrence.  The patient comes in today to for routine follow-up.  Since her last visit, the patient has been doing well.  She denies having any GI symptoms which concern her for disease recurrence.     PHYSICAL EXAM:  Last menstrual period 06/30/2015. Wt Readings from Last 3 Encounters:  11/23/23 216 lb 12.8 oz (98.3 kg)  06/08/23 225 lb 8 oz (102.3 kg)  04/11/23 227 lb (103 kg)   There is no height or weight on file to calculate BMI. Performance status (ECOG): 0 - Asymptomatic Physical Exam Constitutional:      Appearance: Normal appearance. She is not ill-appearing.  HENT:     Mouth/Throat:     Mouth: Mucous membranes are moist.     Pharynx: Oropharynx is clear. No oropharyngeal exudate or posterior oropharyngeal erythema.  Cardiovascular:     Rate and Rhythm: Normal rate and regular rhythm.     Heart sounds: No murmur heard.    No friction rub. No gallop.  Pulmonary:     Effort: Pulmonary effort is normal. No respiratory distress.     Breath sounds: Normal breath sounds. No wheezing, rhonchi or rales.  Abdominal:     General: Bowel sounds are normal. There is no distension.     Palpations: Abdomen is soft. There is no mass.     Tenderness: There is no abdominal tenderness.  Musculoskeletal:        General: No swelling.     Right lower leg: No edema.     Left lower leg: No edema.  Lymphadenopathy:     Cervical: No cervical adenopathy.     Upper Body:      Right upper body: No supraclavicular or axillary adenopathy.     Left upper body: No supraclavicular or axillary adenopathy.     Lower Body: No right inguinal adenopathy. No left inguinal adenopathy.  Skin:    General: Skin is warm.     Coloration: Skin is not jaundiced.     Findings: No lesion or rash.  Neurological:     General: No focal deficit present.     Mental Status: She is alert and oriented to person, place, and time. Mental status is at baseline.  Psychiatric:        Mood and Affect: Mood normal.        Behavior: Behavior normal.        Thought Content: Thought content normal.   SCANS:  CT scans of her abdomen/pelvis revealed the following: FINDINGS: Lower chest: Heart is normal size.  Visualized lung bases are clear.   Hepatobiliary: Liver, gallbladder and biliary tree are normal.   Pancreas: Normal.   Spleen: Normal.   Adrenals/Urinary Tract: Adrenal glands are normal. Kidneys are normal in size without hydronephrosis or nephrolithiasis. Ureters and bladder are normal.   Stomach/Bowel: Surgical clips over the gastric fundus as the stomach is otherwise unremarkable. Small bowel is normal. Appendix is normal. Colon is normal.  Vascular/Lymphatic: Abdominal aorta is normal in caliber. Remaining vascular structures are unremarkable. No adenopathy.   Reproductive: Status post hysterectomy. No adnexal masses.   Other: No free fluid or focal inflammatory change.   Musculoskeletal: No focal abnormality.   IMPRESSION: 1. No acute findings in the abdomen/pelvis. No evidence of recurrent or metastatic disease. 2. Postsurgical changes over the gastric fundus.  LABS:  Latest Reference Range & Units 12/04/23 08:49  WBC 4.0 - 10.5 K/uL 5.8  RBC 3.87 - 5.11 MIL/uL 4.47  Hemoglobin 12.0 - 15.0 g/dL 88.0 (L)  HCT 63.9 - 53.9 % 37.2  MCV 80.0 - 100.0 fL 83.2  MCH 26.0 - 34.0 pg 26.6  MCHC 30.0 - 36.0 g/dL 67.9  RDW 88.4 - 84.4 % 13.7  Platelets 150 - 400 K/uL  232  nRBC 0.0 - 0.2 % 0.0  Neutrophils % 56  Lymphocytes % 34  Monocytes Relative % 6  Eosinophil % 3  Basophil % 1  Immature Granulocytes % 0  (L): Data is abnormally low   Latest Reference Range & Units 06/08/23 09:09 12/04/23 08:49  Iron  28 - 170 ug/dL 45 55  UIBC ug/dL 666 774  TIBC 749 - 549 ug/dL 621 719  Saturation Ratios 10.4 - 31.8 % 12 20  Ferritin 11 - 307 ng/mL 18 199    ASSESSMENT & PLAN:  A 53 y.o. 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  There is nothing clinically or per her physical exam which is concerning for disease recurrence.  Her hemoglobin is slightly low, but stable.  Her iron  parameters have been trending downwards, as has her MCV.  Based upon this and the fact that she has had a partial gastrectomy, I will arrange for her to receive IV iron  over these next few weeks to replenish her iron  stores and improve her hemoglobin.  Otherwise, as she is doing well, I will see her back in 6 months for repeat clinical assessment. CT scans will be done a day before her next visit to ensure there is no radiographic evidence of disease recurrence.  I will also refer her to GI to undergo an EGD to ensure there is no evidence of local disease recurrence.  The patient understands all the plans discussed today and is in agreement with them.  Ryley Bachtel DELENA Kerns, MD

## 2023-12-07 NOTE — Addendum Note (Signed)
 Addended by: ONEITA BERLINER on: 12/07/2023 08:07 AM   Modules accepted: Orders

## 2023-12-08 ENCOUNTER — Other Ambulatory Visit: Payer: Self-pay | Admitting: Oncology

## 2023-12-08 ENCOUNTER — Inpatient Hospital Stay: Admitting: Oncology

## 2023-12-08 VITALS — BP 118/87 | HR 75 | Temp 98.0°F | Resp 16 | Ht 65.0 in | Wt 218.5 lb

## 2023-12-08 DIAGNOSIS — Z8509 Personal history of malignant neoplasm of other digestive organs: Secondary | ICD-10-CM | POA: Diagnosis not present

## 2023-12-08 DIAGNOSIS — D509 Iron deficiency anemia, unspecified: Secondary | ICD-10-CM

## 2023-12-08 DIAGNOSIS — C49A2 Gastrointestinal stromal tumor of stomach: Secondary | ICD-10-CM

## 2023-12-20 ENCOUNTER — Other Ambulatory Visit: Payer: Self-pay

## 2023-12-20 ENCOUNTER — Encounter (HOSPITAL_BASED_OUTPATIENT_CLINIC_OR_DEPARTMENT_OTHER): Payer: Self-pay | Admitting: Orthopedic Surgery

## 2023-12-21 ENCOUNTER — Other Ambulatory Visit: Payer: Self-pay | Admitting: Internal Medicine

## 2023-12-21 DIAGNOSIS — Z1231 Encounter for screening mammogram for malignant neoplasm of breast: Secondary | ICD-10-CM

## 2023-12-27 ENCOUNTER — Encounter (HOSPITAL_BASED_OUTPATIENT_CLINIC_OR_DEPARTMENT_OTHER)
Admission: RE | Disposition: A | Discharge status: Home / Self Care | Payer: Self-pay | Source: Home / Self Care | Attending: Orthopedic Surgery

## 2023-12-27 ENCOUNTER — Ambulatory Visit (HOSPITAL_BASED_OUTPATIENT_CLINIC_OR_DEPARTMENT_OTHER)

## 2023-12-27 ENCOUNTER — Other Ambulatory Visit: Payer: Self-pay

## 2023-12-27 ENCOUNTER — Encounter (HOSPITAL_BASED_OUTPATIENT_CLINIC_OR_DEPARTMENT_OTHER): Payer: Self-pay | Admitting: Orthopedic Surgery

## 2023-12-27 ENCOUNTER — Ambulatory Visit (HOSPITAL_BASED_OUTPATIENT_CLINIC_OR_DEPARTMENT_OTHER)
Admission: RE | Admit: 2023-12-27 | Discharge: 2023-12-27 | Disposition: A | Discharge status: Home / Self Care | Attending: Orthopedic Surgery | Admitting: Orthopedic Surgery

## 2023-12-27 DIAGNOSIS — M65331 Trigger finger, right middle finger: Secondary | ICD-10-CM | POA: Insufficient documentation

## 2023-12-27 DIAGNOSIS — K219 Gastro-esophageal reflux disease without esophagitis: Secondary | ICD-10-CM | POA: Insufficient documentation

## 2023-12-27 DIAGNOSIS — I10 Essential (primary) hypertension: Secondary | ICD-10-CM | POA: Diagnosis not present

## 2023-12-27 DIAGNOSIS — Z7985 Long-term (current) use of injectable non-insulin antidiabetic drugs: Secondary | ICD-10-CM | POA: Diagnosis not present

## 2023-12-27 DIAGNOSIS — E119 Type 2 diabetes mellitus without complications: Secondary | ICD-10-CM | POA: Diagnosis not present

## 2023-12-27 DIAGNOSIS — M65332 Trigger finger, left middle finger: Secondary | ICD-10-CM | POA: Diagnosis not present

## 2023-12-27 DIAGNOSIS — D649 Anemia, unspecified: Secondary | ICD-10-CM | POA: Diagnosis not present

## 2023-12-27 DIAGNOSIS — Z79899 Other long term (current) drug therapy: Secondary | ICD-10-CM | POA: Insufficient documentation

## 2023-12-27 DIAGNOSIS — Z833 Family history of diabetes mellitus: Secondary | ICD-10-CM | POA: Insufficient documentation

## 2023-12-27 DIAGNOSIS — Z7951 Long term (current) use of inhaled steroids: Secondary | ICD-10-CM | POA: Diagnosis not present

## 2023-12-27 DIAGNOSIS — M65842 Other synovitis and tenosynovitis, left hand: Secondary | ICD-10-CM | POA: Insufficient documentation

## 2023-12-27 DIAGNOSIS — Z794 Long term (current) use of insulin: Secondary | ICD-10-CM | POA: Diagnosis not present

## 2023-12-27 DIAGNOSIS — F419 Anxiety disorder, unspecified: Secondary | ICD-10-CM | POA: Insufficient documentation

## 2023-12-27 DIAGNOSIS — G4733 Obstructive sleep apnea (adult) (pediatric): Secondary | ICD-10-CM

## 2023-12-27 DIAGNOSIS — M1990 Unspecified osteoarthritis, unspecified site: Secondary | ICD-10-CM | POA: Insufficient documentation

## 2023-12-27 DIAGNOSIS — Z6835 Body mass index (BMI) 35.0-35.9, adult: Secondary | ICD-10-CM | POA: Insufficient documentation

## 2023-12-27 DIAGNOSIS — I2510 Atherosclerotic heart disease of native coronary artery without angina pectoris: Secondary | ICD-10-CM

## 2023-12-27 DIAGNOSIS — M65341 Trigger finger, right ring finger: Secondary | ICD-10-CM | POA: Insufficient documentation

## 2023-12-27 DIAGNOSIS — Z01818 Encounter for other preprocedural examination: Secondary | ICD-10-CM

## 2023-12-27 HISTORY — PX: TRIGGER FINGER RELEASE: SHX641

## 2023-12-27 LAB — GLUCOSE, CAPILLARY
Glucose-Capillary: 89 mg/dL (ref 70–99)
Glucose-Capillary: 62 mg/dL — ABNORMAL LOW (ref 70–99)
Glucose-Capillary: 94 mg/dL (ref 70–99)

## 2023-12-27 SURGERY — RELEASE, A1 PULLEY, FOR TRIGGER FINGER
Anesthesia: Monitor Anesthesia Care | Laterality: Bilateral

## 2023-12-27 MED ORDER — BUPIVACAINE HCL 0.25 % IJ SOLN
INTRAMUSCULAR | Status: DC | PRN
  Administered 2023-12-27: 18 mL

## 2023-12-27 MED ORDER — BUPIVACAINE HCL (PF) 0.25 % IJ SOLN
INTRAMUSCULAR | Status: AC
Start: 2023-12-27 — End: 2023-12-27
  Filled 2023-12-27: qty 30

## 2023-12-27 MED ORDER — FENTANYL CITRATE (PF) 100 MCG/2ML IJ SOLN
INTRAMUSCULAR | Status: DC | PRN
  Administered 2023-12-27 (×2): 25 ug via INTRAVENOUS

## 2023-12-27 MED ORDER — ACETAMINOPHEN 10 MG/ML IV SOLN
INTRAVENOUS | Status: DC | PRN
  Administered 2023-12-27: 1000 mg via INTRAVENOUS

## 2023-12-27 MED ORDER — CEFAZOLIN SODIUM-DEXTROSE 2-4 GM/100ML-% IV SOLN
2.0000 g | INTRAVENOUS | Status: AC
  Administered 2023-12-27: 2 g via INTRAVENOUS

## 2023-12-27 MED ORDER — OXYCODONE HCL 5 MG PO TABS: 5.0000 mg | ORAL_TABLET | Freq: Once | ORAL | Status: DC | PRN

## 2023-12-27 MED ORDER — LACTATED RINGERS IV SOLN: INTRAVENOUS | Status: DC

## 2023-12-27 MED ORDER — FENTANYL CITRATE (PF) 100 MCG/2ML IJ SOLN: 25.0000 ug | INTRAMUSCULAR | Status: DC | PRN

## 2023-12-27 MED ORDER — ONDANSETRON HCL 4 MG/2ML IJ SOLN: 4.0000 mg | Freq: Once | INTRAMUSCULAR | Status: DC | PRN

## 2023-12-27 MED ORDER — MIDAZOLAM HCL 2 MG/2ML IJ SOLN
INTRAMUSCULAR | Status: AC
Start: 2023-12-27 — End: 2023-12-27
  Filled 2023-12-27: qty 2

## 2023-12-27 MED ORDER — OXYCODONE HCL 5 MG PO TABS
5.0000 mg | ORAL_TABLET | Freq: Four times a day (QID) | ORAL | 0 refills | Status: AC | PRN
Start: 2023-12-27 — End: 2023-12-30

## 2023-12-27 MED ORDER — FENTANYL CITRATE (PF) 100 MCG/2ML IJ SOLN
INTRAMUSCULAR | Status: AC
  Filled 2023-12-27: qty 2

## 2023-12-27 MED ORDER — PROPOFOL 500 MG/50ML IV EMUL
INTRAVENOUS | Status: DC | PRN
Start: 2023-12-27 — End: 2023-12-27
  Administered 2023-12-27: 100 ug/kg/min via INTRAVENOUS

## 2023-12-27 MED ORDER — PROPOFOL 10 MG/ML IV BOLUS
INTRAVENOUS | Status: DC | PRN
  Administered 2023-12-27: 30 mg via INTRAVENOUS

## 2023-12-27 MED ORDER — OXYCODONE HCL 5 MG/5ML PO SOLN: 5.0000 mg | Freq: Once | ORAL | Status: DC | PRN

## 2023-12-27 MED ORDER — LIDOCAINE HCL (PF) 1 % IJ SOLN
INTRAMUSCULAR | Status: AC
Start: 2023-12-27 — End: 2023-12-27
  Filled 2023-12-27: qty 5

## 2023-12-27 MED ORDER — MIDAZOLAM HCL 5 MG/5ML IJ SOLN
INTRAMUSCULAR | Status: DC | PRN
  Administered 2023-12-27: 2 mg via INTRAVENOUS

## 2023-12-27 MED ORDER — LACTATED RINGERS IV SOLN
INTRAVENOUS | Status: DC | PRN
Start: 2023-12-27 — End: 2023-12-27

## 2023-12-27 MED ORDER — ACETAMINOPHEN 10 MG/ML IV SOLN: 1000.0000 mg | Freq: Once | INTRAVENOUS | Status: DC | PRN

## 2023-12-27 MED ORDER — LIDOCAINE HCL 1 % IJ SOLN
INTRAMUSCULAR | Status: DC | PRN
  Administered 2023-12-27: 1 mL

## 2023-12-27 MED ORDER — ACETAMINOPHEN 10 MG/ML IV SOLN
INTRAVENOUS | Status: AC
Start: 2023-12-27 — End: 2023-12-27
  Filled 2023-12-27: qty 100

## 2023-12-27 MED ORDER — BETAMETHASONE SOD PHOS & ACET 6 (3-3) MG/ML IJ SUSP
INTRAMUSCULAR | Status: DC | PRN
  Administered 2023-12-27: 1 mL via INTRA_ARTICULAR

## 2023-12-27 MED ORDER — CEFAZOLIN SODIUM-DEXTROSE 2-4 GM/100ML-% IV SOLN
INTRAVENOUS | Status: AC
  Filled 2023-12-27: qty 100

## 2023-12-27 MED ORDER — BETAMETHASONE SOD PHOS & ACET 6 (3-3) MG/ML IJ SUSP
INTRAMUSCULAR | Status: AC
Start: 2023-12-27 — End: 2023-12-27
  Filled 2023-12-27: qty 5

## 2023-12-27 SURGICAL SUPPLY — 26 items
BLADE SURG 15 STRL LF DISP TIS (BLADE) ×1 IMPLANT
BNDG COMPR ESMARK 4X3 LF (GAUZE/BANDAGES/DRESSINGS) ×1 IMPLANT
BNDG ELASTIC 2INX 5YD STR LF (GAUZE/BANDAGES/DRESSINGS) ×1 IMPLANT
BNDG ELASTIC 3INX 5YD STR LF (GAUZE/BANDAGES/DRESSINGS) IMPLANT
CHLORAPREP W/TINT 26 (MISCELLANEOUS) ×1 IMPLANT
CORD BIPOLAR FORCEPS 12FT (ELECTRODE) ×1 IMPLANT
COVER BACK TABLE 60X90IN (DRAPES) ×1 IMPLANT
CUFF TOURN SGL QUICK 18X4 (TOURNIQUET CUFF) IMPLANT
CUFF TRNQT CYL 24X4X16.5-23 (TOURNIQUET CUFF) IMPLANT
DRAPE EXTREMITY T 121X128X90 (DISPOSABLE) ×1 IMPLANT
DRAPE SURG 17X23 STRL (DRAPES) ×1 IMPLANT
GAUZE SPONGE 4X4 12PLY STRL (GAUZE/BANDAGES/DRESSINGS) IMPLANT
GAUZE XEROFORM 1X8 LF (GAUZE/BANDAGES/DRESSINGS) ×1 IMPLANT
GLOVE BIO SURGEON STRL SZ7 (GLOVE) ×1 IMPLANT
GLOVE BIOGEL PI IND STRL 7.0 (GLOVE) ×1 IMPLANT
GOWN STRL REUS W/ TWL LRG LVL3 (GOWN DISPOSABLE) ×2 IMPLANT
NDL HYPO 25X1 1.5 SAFETY (NEEDLE) ×1 IMPLANT
PACK BASIN DAY SURGERY FS (CUSTOM PROCEDURE TRAY) ×1 IMPLANT
SHEET MEDIUM DRAPE 40X70 STRL (DRAPES) ×1 IMPLANT
SOLN 0.9% NACL POUR BTL 1000ML (IV SOLUTION) ×1 IMPLANT
SUT ETHILON 4 0 PS 2 18 (SUTURE) ×1 IMPLANT
SUT VIC AB 4-0 PS2 18 (SUTURE) IMPLANT
SYR BULB EAR ULCER 3OZ GRN STR (SYRINGE) ×1 IMPLANT
SYR CONTROL 10ML LL (SYRINGE) ×1 IMPLANT
TOWEL GREEN STERILE FF (TOWEL DISPOSABLE) ×1 IMPLANT
UNDERPAD 30X36 HEAVY ABSORB (UNDERPADS AND DIAPERS) ×1 IMPLANT

## 2023-12-27 NOTE — Transfer of Care (Signed)
 Immediate Anesthesia Transfer of Care Note  Patient: Krista [name]  Procedure(s) Performed: RELEASE, A1 PULLEY, FOR TRIGGER FINGER RIGHT, injection left middle finger (Bilateral)  Patient Location: PACU  Anesthesia Type:MAC  Level of Consciousness: awake, alert , oriented, and patient cooperative  Airway & Oxygen Therapy: Patient Spontanous Breathing  Post-op Assessment: Report given to RN and Post -op Vital signs reviewed and stable  Post vital signs: Reviewed and stable  Last Vitals:  Vitals Value Taken Time  BP 137/87 12/27/23 08:23  Temp 36.2 C 12/27/23 08:23  Pulse 80 12/27/23 08:24  Resp 12 12/27/23 08:24  SpO2 95 % 12/27/23 08:24  Vitals shown include unfiled device data.  Last Pain:  Vitals:   12/27/23 0640  TempSrc: Temporal  PainSc: 0-No pain         Complications: No notable events documented.

## 2023-12-27 NOTE — Op Note (Signed)
   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  Risks, benefits, and alternatives to surgery were again discussed with the patient in the preoperative area. The patient wishes to proceed with surgery.  Informed consent was signed after our discussion.   PREOPERATIVE DIAGNOSIS:  Right middle trigger finger Right ring trigger finger Left middle trigger finger  POSTOPERATIVE DIAGNOSIS: Same.  PROCEDURE:  Right middle finger A1 pulley release Right ring finger A1 pulley release Left middle finger corticosteroid injection into A1 pulley/tendon sheath   SURGEON: Carlin Galla, M.D.  ASSIST: None  ANESTHESIA:  Local, MAC  IV FLUIDS AND URINE: See anesthesia.  ESTIMATED BLOOD LOSS: <5 mL.  IMPLANTS: * No implants in log *   DRAINS: None  COMPLICATIONS: None noted  DESCRIPTION OF PROCEDURE: The patient was met in the preoperative holding area where the surgical site was marked and the informed consent form was signed.  The patient was then brought back to the operating room and remained on the stretcher.  A hand table was placed adjacent to the operative extremity and locked into place.  A tourniquet was placed on the right forearm.  A formal timeout was performed to confirm that this was the correct patient, surgical side, surgical site, and surgical procedure.  All were present and in agreement. Following formal timeout, a local block was performed using 18 mL of an equal mixture of 1% plain lidocaine  and 0.25% plain marcaine .  The right upper extremity was then prepped and draped in the usual and sterile fashion.   The limb was exsanguinated and the tourniquet inflated to 250 mmHg.  A longitudinal incision was made over the middle and ring finger A1 pulleys.  The skin was incised.  I began with the middle finger.  Blunt dissection was  used to identify the A1 pulley.  Two Ragnell retractors were placed on the radial and ulnar sides of the pulley to protect the respective neurovascular bundles.  A third Ragnell was placed at the distal aspect of the wound.  The A1 pulley was clearly identified.  Under direct visualization, the pulley was entered sharply using a 15 blade.  Tenotomy scissors were used to complete the pulley release distally to the level of the A2 pulley.  The distal retractor was then placed in the proximal aspect of the wound.  Under direct visualization, the proximal aspect of the A1 pulley was completely released. The same process was repeated for the ring finger.   The tourniquet was let down and hemostasis achieved with direct pressure and bipolar electrocautery.  The wounds were then thoroughly irrigated.  They were closed using 4-0 vicryl rapide sutures in a horizontal mattress fashion.  The wounds was dressed with xeroform, 4x4, and an ace wrap.   I then injected the left middle finger A1 pulley with 1cc of plain lidocaine  mixed with 1 cc of 6 mg/mL celestone .    The patient was reversed from sedation.  All counts were correct x 2 at the end of the procedure.  The patient was then taken to the PACU in stable condition.    POSTOPERATIVE PLAN: She will be discharged to home with appropriate pain medication and discharge instructions.  I'll see her back in the office in 10-14 days for her first postop visit.   Carlin Galla, MD 9:57 AM

## 2023-12-27 NOTE — Anesthesia Procedure Notes (Signed)
 Procedure Name: MAC Date/Time: 12/27/2023 7:37 AM  Performed by: Denton Niels CROME, CRNAPre-anesthesia Checklist: Patient identified, Emergency Drugs available, Suction available, Patient being monitored and Timeout performed Patient Re-evaluated:Patient Re-evaluated prior to induction Oxygen Delivery Method: Simple face mask Induction Type: IV induction Placement Confirmation: positive ETCO2 Dental Injury: Teeth and Oropharynx as per pre-operative assessment

## 2023-12-27 NOTE — Anesthesia Postprocedure Evaluation (Signed)
 Anesthesia Post Note  Patient: Krista [name], Krista [name]  Procedure(s) Performed: RELEASE, A1 PULLEY, FOR TRIGGER FINGER RIGHT, injection left middle finger (Bilateral)     Patient location during evaluation: PACU Anesthesia Type: MAC Level of consciousness: awake and alert Pain management: pain level controlled Vital Signs Assessment: post-procedure vital signs reviewed and stable Respiratory status: spontaneous breathing, nonlabored ventilation, respiratory function stable and patient connected to nasal cannula oxygen Cardiovascular status: stable and blood pressure returned to baseline Postop Assessment: no apparent nausea or vomiting Anesthetic complications: no   No notable events documented.  Last Vitals:  Vitals:   12/27/23 0841 12/27/23 0849  BP: (!) 143/99 (!) 149/89  Pulse: 64 67  Resp: 11 16  Temp:  (!) 36.3 C  SpO2: 97% 95%    Last Pain:  Vitals:   12/27/23 0849  TempSrc:   PainSc: 0-No pain                 Lynwood MARLA Cornea

## 2023-12-27 NOTE — Interval H&P Note (Signed)
 History and Physical Interval Note:  12/27/2023 7:34 AM  Krista [name]  has presented today for surgery, with the diagnosis of trigger finger, right middle finger.  The various methods of treatment have been discussed with the patient and family. After consideration of risks, benefits and other options for treatment, the patient has consented to  Procedure(s) with comments: RELEASE, A1 PULLEY, FOR TRIGGER FINGER (Right) - right middle and ring finger as a surgical intervention.  The patient's history has been reviewed, patient examined, no change in status, stable for surgery.  I have reviewed the patient's chart and labs.  Questions were answered to the patient's satisfaction.     Jerard Bays

## 2023-12-27 NOTE — H&P (Signed)
 HAND SURGERY   HPI: Patient is a 53 y.o. female who presents with right middle and ring and left middle finger stenosing tenosynovitis that has failed conservative management.  Patient denies any changes to their medical history or new systemic symptoms today.    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   GERD (gastroesophageal reflux disease)    GIST (gastrointestinal stromal tumor), malignant (HCC)    History of palpitations yrs ago   PVCs - wore holter monitor, negative results   HTN (hypertension) 11/22/2012   Hypertension    Mixed dyslipidemia 04/05/2021   Morbid obesity (HCC) 11/22/2012   OSA on CPAP 11/22/2012   Palpitations    Restless leg syndrome    patient denies this dx   Seasonal allergies    Status post laparoscopic hysterectomy 07/21/2015   STD (sexually transmitted disease)    hx HPV   Stomach cancer (HCC)    SVD (spontaneous vaginal delivery)    x 1   Vulvar cyst    Past Surgical History:  Procedure Laterality Date   CERVICAL BIOPSY  W/ LOOP ELECTRODE EXCISION  2006   colonscopy  09/2019   polyps return in 5 years   COLPOSCOPY  2006   CYSTOSCOPY N/A 07/21/2015   Procedure: CYSTOSCOPY;  Surgeon: Bobie FORBES Cathlyn JAYSON Nikki, MD;  Location: [facility];  Service: Gynecology;  Laterality: N/A;   ESOPHAGOGASTRODUODENOSCOPY N/A 03/25/2021   Procedure: ESOPHAGOGASTRODUODENOSCOPY (EGD);  Surgeon: Teressa Toribio SQUIBB, MD;  Location: [facility];  Service: Endoscopy;  Laterality: N/A;   ESOPHAGOGASTRODUODENOSCOPY (EGD) WITH PROPOFOL  N/A 12/12/2019   Procedure:  ESOPHAGOGASTRODUODENOSCOPY (EGD) WITH PROPOFOL ;  Surgeon: Teressa Toribio SQUIBB, MD;  Location: [facility];  Service: Endoscopy;  Laterality: N/A;   EUS N/A 12/12/2019   Procedure: UPPER ENDOSCOPIC ULTRASOUND (EUS) LINEAR;  Surgeon: Teressa Toribio SQUIBB, MD;  Location: [facility];  Service: Endoscopy;  Laterality: N/A;   EUS N/A 03/25/2021   Procedure: UPPER ENDOSCOPIC ULTRASOUND (EUS) RADIAL;  Surgeon: Teressa Toribio SQUIBB, MD;  Location: [facility];  Service: Endoscopy;  Laterality: N/A;   FINE NEEDLE ASPIRATION N/A 12/12/2019   Procedure: FINE NEEDLE ASPIRATION (FNA) LINEAR;  Surgeon: Teressa Toribio SQUIBB, MD;  Location: [facility];  Service: Endoscopy;  Laterality: N/A;   LESION REMOVAL Left 11/11/2019   Procedure: EXCISION OF LEFT VULVAR CYST;  Surgeon: Cathlyn JAYSON Nikki Bobie FORBES, MD;  Location: [facility];  Service: Gynecology;  Laterality: Left;   ROBOTIC ASSISTED TOTAL HYSTERECTOMY WITH SALPINGECTOMY Bilateral 07/21/2015   Procedure: ROBOTIC ASSISTED TOTAL HYSTERECTOMY WITH SALPINGECTOMY;  Surgeon: Bobie FORBES Cathlyn JAYSON Nikki, MD;  Location: [facility];  Service: Gynecology;  Laterality: Bilateral;   UPPER GI ENDOSCOPY  09/2019   WISDOM TOOTH EXTRACTION  1990's   Social History   Socioeconomic History   Marital status: Married    Spouse name: TONY   Number of children: 1   Years of education: 12 + 4   Highest education level: Not on file  Occupational History   Occupation:  [employer] MANAGER  Tobacco Use   Smoking status: Never   Smokeless tobacco: Never  Vaping Use   Vaping status: Never Used  Substance and Sexual Activity   Alcohol use: Yes    Comment: 1 drink/month   Drug use: No   Sexual activity: Yes    Partners: Male    Birth control/protection: Surgical    Comment: R-TLH/Bil.salingectomy  Other Topics Concern   Not on file  Social History Narrative   Not on file   Social Drivers of Health   Financial Resource Strain: Not on file  Food Insecurity: Not on file   Transportation Needs: Not on file  Physical Activity: Not on file  Stress: Not on file  Social Connections: Not on file   Family History  Problem Relation Age of Onset   Hypertension Mother    Diabetes Father    Hypertension Father    Cancer Maternal Aunt        dec bladder ca   Breast cancer Paternal Aunt    Ovarian cancer Maternal Grandmother    Hypertension Maternal Grandmother    Stroke Maternal Grandfather    Diabetes Paternal Grandmother    Hypertension Paternal Grandmother    Alzheimer's disease Paternal Grandfather    Breast cancer Cousin    - negative except otherwise stated in the family history section Allergies  Allergen Reactions   Ace Inhibitors Cough   Elemental Sulfur Itching and Rash    CAN WE CLARIFY WHAT MEDICATION IT WAS?   Penicillins Itching, Swelling and Rash    Has patient had a PCN reaction causing immediate rash, facial/tongue/throat swelling, SOB or lightheadedness with hypotension: Yes Has patient had a PCN reaction causing severe rash involving mucus membranes or skin necrosis: Yes Has patient had a PCN reaction that required hospitalization No Has patient had a PCN reaction occurring within the last 10 years: No If all of the above answers are NO, then may proceed with Cephalosporin use.    Prior to Admission medications   Medication Sig Start Date End Date Taking? Authorizing Provider  DULoxetine  (CYMBALTA ) 60 MG capsule Take 60 mg by mouth daily.  01/01/15  Yes [provider]  fexofenadine (ALLEGRA) 180 MG tablet Take 180 mg by mouth daily.    Yes [provider]  fluticasone (FLONASE) 50 MCG/ACT nasal spray Place 2 sprays into both nostrils daily.  06/12/19  Yes [provider]  losartan  (COZAAR ) 100 MG tablet Take 1 tablet (100 mg total) by mouth daily. 11/23/23  Yes Revankar, Jennifer SAUNDERS, MD  montelukast  (SINGULAIR ) 10 MG tablet Take 10 mg by mouth at bedtime.  12/06/13  Yes [provider]  Multiple  Vitamin (MULTIVITAMIN) capsule Take 1 capsule by mouth daily.   Yes [provider]  propranolol  ER (INDERAL  LA) 160 MG SR capsule Take 160 mg by mouth daily. 05/10/19  Yes [provider]  PROTONIX 40 MG tablet Take 40 mg by mouth as needed (indigestion, reflux). 04/19/21  Yes [provider]  rosuvastatin (CRESTOR) 10 MG tablet Take 10 mg by mouth at bedtime. 10/24/23  Yes [provider]  Vitamin D, Ergocalciferol, (DRISDOL) 50000 UNITS CAPS capsule Take 50,000 Units by mouth every Thursday.  12/02/13  Yes [provider]  ACCU-CHEK GUIDE TEST test strip 2 (two) times daily. 10/07/23   [provider]  acetaminophen  (TYLENOL ) 500 MG tablet Take 1,000 mg by mouth every 8 (eight) hours as needed for mild pain.    [provider]  albuterol (VENTOLIN HFA) 108 (90 Base) MCG/ACT inhaler Inhale 2 puffs into the lungs every 4 (four) hours as needed for shortness of breath. 02/15/21   [provider]  baclofen (LIORESAL) 10 MG tablet Take 10 mg by mouth daily as needed for muscle spasms.     [provider]  budesonide-formoterol (SYMBICORT) 160-4.5 MCG/ACT inhaler Inhale 2 puffs into the lungs 2 (two) times daily as needed for shortness of breath.    [provider]  Continuous Glucose Sensor (DEXCOM G7 SENSOR) MISC SMARTSIG:1 Topical Every 10 Days 10/27/23   [provider]  EASY TOUCH PEN NEEDLES 32G X 5 MM MISC  08/01/23   [provider]  nitroGLYCERIN (NITROSTAT) 0.4 MG SL tablet Place 0.4 mg under the tongue as needed for chest pain. 09/24/20   [provider]  OZEMPIC, 2 MG/DOSE, 8 MG/3ML SOPN Inject 2 mg into the skin once a week. 10/13/23   [provider]  TRESIBA FLEXTOUCH 200 UNIT/ML FlexTouch Pen INJECT 10 UNITS DAILY FOR 1 WEEK THEN TITRATE DOSE BY 2 UNITS EVERY 3 DAYS UNTIL MORNING FASTING SUGAR IS AROUND 120. MAX DAILY DOSE 50 UNITS 08/28/23   [provider]   No  results found. - Positive ROS: All other systems have been reviewed and were otherwise negative with the exception of those mentioned in the HPI and as above.  Physical Exam: General: No acute distress, resting comfortably Cardiovascular: BUE warm and well perfused, normal rate Respiratory: Normal WOB on RA Skin: Warm and dry Neurologic: Sensation intact distally Psychiatric: Patient is at baseline mood and affect  Right upper Extremity  Tender to palpation over the middle and ring finger A1 pulleys with palpable and visible locking catching.  Full painless range of motion of the many fingers.  Sensation is intact to light touch throughout the hand.  All fingers are pink and well-perfused.  Assessment: 53 year old female with stenosing tenosynovitis of the right middle and ring fingers that has failed conservative management with early stenosing tenosynovitis of the left middle finger  Plan: OR today for right middle and ring finger A1 pulley release with corticosteroid injection into the left middle finger . We again reviewed the risks of surgery which include bleeding, infection, damage to neurovascular structures, persistent symptoms, scar sensitivity, delayed wound healing, need for additional surgery.  Informed consent was signed.  All questions were answered.   Bebe Galla, M.D. [facility] 7:33 AM

## 2023-12-27 NOTE — Discharge Instructions (Addendum)
 Carlin Galla, M.D. Hand Surgery  POST-OPERATIVE DISCHARGE INSTRUCTIONS   PRESCRIPTIONS: - You may have been given a prescription to be taken as directed for post-operative pain control.  You may also take over the counter ibuprofen /aleve and tylenol  for pain. Take this as directed on the packaging. Do not exceed 3000 mg tylenol /acetaminophen  in 24 hours.  Ibuprofen  600-800 mg (3-4) tablets by mouth every 6 hours as needed for pain.   OR  Aleve 2 tablets by mouth every 12 hours (twice daily) as needed for pain.   AND/OR  Tylenol  1000 mg (2 tablets) every 8 hours as needed for pain.  - Please use your pain medication carefully, as refills are limited and you may not be provided with one.  As stated above, please use over the counter pain medicine - it will also be helpful with decreasing your swelling.    ANESTHESIA: -After your surgery, post-surgical discomfort or pain is likely. This discomfort can last several days to a few weeks. At certain times of the day your discomfort may be more intense.   Did you receive a nerve block?   - A nerve block can provide pain relief for one hour to two days after your surgery. As long as the nerve block is working, you will experience little or no sensation in the area the surgeon operated on.  - As the nerve block wears off, you will begin to experience pain or discomfort. It is very important that you begin taking your prescribed pain medication before the nerve block fully wears off. Treating your pain at the first sign of the block wearing off will ensure your pain is better controlled and more tolerable when full-sensation returns. Do not wait until the pain is intolerable, as the medicine will be less effective. It is better to treat pain in advance than to try and catch up.   General Anesthesia:  If you did not receive a nerve block during your surgery, you will need to start taking your pain medication shortly after your surgery and  should continue to do so as prescribed by your surgeon.     ICE AND ELEVATION: - You may use ice for the first 48-72 hours, but it is not critical.   - Motion of your fingers is very important to decrease the swelling.  - Elevation, as much as possible for the next 48 hours, is critical for decreasing swelling as well as for pain relief. Elevation means when you are seated or lying down, you hand should be at or above your heart. When walking, the hand needs to be at or above the level of your elbow.  - If the bandage gets too tight, it may need to be loosened. Please contact our office and we will instruct you in how to do this.    SURGICAL BANDAGES:  - Keep your dressing and/or splint clean and dry at all times.  You can remove your dressing 7 days from now and change with a dry dressing or Band-Aids as needed thereafter. - You may place a plastic bag over your bandage to shower, but be careful, do not get your bandages wet.  - After the bandages have been removed, it is OK to get the stitches wet in a shower or with hand washing. Do Not soak or submerge the wound yet. Please do not use lotions or creams on the stitches.      HAND THERAPY:  - You may not need any. If you  do, we will begin this at your follow up visit in the clinic.    ACTIVITY AND WORK: - You are encouraged to move any fingers which are not in the bandage.  - Light use of the fingers is allowed to assist the other hand with daily hygiene and eating, but strong gripping or lifting is often uncomfortable and should be avoided.  - You might miss a variable period of time from work and hopefully this issue has been discussed prior to surgery. You may not do any heavy work with your affected hand for about 2 weeks.    [facility] Second Floor, [address] [place], [place] [address] [phone]     Post Anesthesia Home Care Instructions  Activity: Get plenty of rest for the remainder of the day. A  responsible individual must stay with you for 24 hours following the procedure.  For the next 24 hours, DO NOT: -Drive a car -Advertising copywriter -Drink alcoholic beverages -Take any medication unless instructed by your physician -Make any legal decisions or sign important papers.  Meals: Start with liquid foods such as gelatin or soup. Progress to regular foods as tolerated. Avoid greasy, spicy, heavy foods. If nausea and/or vomiting occur, drink only clear liquids until the nausea and/or vomiting subsides. Call your physician if vomiting continues.  Special Instructions/Symptoms: Your throat may feel dry or sore from the anesthesia or the breathing tube placed in your throat during surgery. If this causes discomfort, gargle with warm salt water . The discomfort should disappear within 24 hours.  If you had a scopolamine  patch placed behind your ear for the management of post- operative nausea and/or vomiting:  1. The medication in the patch is effective for 72 hours, after which it should be removed.  Wrap patch in a tissue and discard in the trash. Wash hands thoroughly with soap and water . 2. You may remove the patch earlier than 72 hours if you experience unpleasant side effects which may include dry mouth, dizziness or visual disturbances. 3. Avoid touching the patch. Wash your hands with soap and water  after contact with the patch.     No tylenol  until after 145pm

## 2023-12-27 NOTE — Anesthesia Preprocedure Evaluation (Signed)
 Anesthesia Evaluation  Patient identified by MRN, date of birth, ID band Patient awake    Reviewed: Allergy & Precautions, NPO status , Patient's Chart, lab work & pertinent test results, reviewed documented beta blocker date and time   History of Anesthesia Complications Negative for: history of anesthetic complications  Airway Mallampati: II  TM Distance: >3 FB     Dental no notable dental hx.    Pulmonary sleep apnea    breath sounds clear to auscultation       Cardiovascular hypertension, (-) angina + CAD and + DOE  (-) Past MI and (-) Cardiac Stents  Rhythm:Regular Rate:Normal  IMPRESSIONS     1. Left ventricular ejection fraction, by estimation, is 60 to 65%. The  left ventricle has normal function. The left ventricle has no regional  wall motion abnormalities. There is mild left ventricular hypertrophy.  Left ventricular diastolic parameters  are consistent with Grade I diastolic dysfunction (impaired relaxation).   2. Right ventricular systolic function is normal. The right ventricular  size is normal. There is normal pulmonary artery systolic pressure.   3. The mitral valve is normal in structure. No evidence of mitral valve  regurgitation. No evidence of mitral stenosis.   4. The aortic valve is normal in structure. Aortic valve regurgitation is  not visualized. No aortic stenosis is present.   5. The inferior vena cava is normal in size with greater than 50%  respiratory variability, suggesting right atrial pressure of 3 mmHg.     Neuro/Psych neg Seizures  Anxiety        GI/Hepatic ,GERD  ,,  Endo/Other  diabetes, Type 2    Renal/GU      Musculoskeletal  (+) Arthritis ,    Abdominal   Peds  Hematology  (+) Blood dyscrasia, anemia   Anesthesia Other Findings   Reproductive/Obstetrics                              Anesthesia Physical Anesthesia Plan  ASA: 2  Anesthesia  Plan: MAC   Post-op Pain Management:    Induction: Intravenous  PONV Risk Score and Plan: 1 and Ondansetron  and Propofol  infusion  Airway Management Planned: Natural Airway and Simple Face Mask  Additional Equipment:   Intra-op Plan:   Post-operative Plan:   Informed Consent: I have reviewed the patients History and Physical, chart, labs and discussed the procedure including the risks, benefits and alternatives for the proposed anesthesia with the patient or authorized representative who has indicated his/her understanding and acceptance.     Dental advisory given  Plan Discussed with: CRNA  Anesthesia Plan Comments: (Local by surgeon)         Anesthesia Quick Evaluation

## 2023-12-28 ENCOUNTER — Encounter (HOSPITAL_BASED_OUTPATIENT_CLINIC_OR_DEPARTMENT_OTHER): Payer: Self-pay | Admitting: Orthopedic Surgery

## 2024-01-12 ENCOUNTER — Other Ambulatory Visit: Payer: Self-pay

## 2024-01-16 ENCOUNTER — Encounter: Payer: Self-pay | Admitting: Cardiology

## 2024-01-16 ENCOUNTER — Ambulatory Visit: Admitting: Cardiology

## 2024-01-16 VITALS — BP 130/86 | HR 78 | Ht 65.5 in | Wt 220.0 lb

## 2024-01-16 DIAGNOSIS — E782 Mixed hyperlipidemia: Secondary | ICD-10-CM

## 2024-01-16 DIAGNOSIS — G4733 Obstructive sleep apnea (adult) (pediatric): Secondary | ICD-10-CM

## 2024-01-16 DIAGNOSIS — I10 Essential (primary) hypertension: Secondary | ICD-10-CM

## 2024-01-16 DIAGNOSIS — E669 Obesity, unspecified: Secondary | ICD-10-CM

## 2024-01-16 DIAGNOSIS — R931 Abnormal findings on diagnostic imaging of heart and coronary circulation: Secondary | ICD-10-CM

## 2024-01-16 NOTE — Patient Instructions (Signed)

## 2024-01-16 NOTE — Progress Notes (Signed)
 Cardiology Office Note:    Date:  01/16/2024   ID:  Krista [name], DOB August 22, 1970, MRN [id number]  PCP:  Gable Cambric, MD  Cardiologist:  Jennifer JONELLE Crape, MD   Referring MD: Gable Cambric, MD    ASSESSMENT:    1. Primary hypertension   2. Elevated coronary artery calcium score   3. OSA on CPAP   4. Obesity (BMI 35.0-39.9 without comorbidity)   5. Mixed dyslipidemia    PLAN:    In order of problems listed above:  Primary prevention stressed with the patient.  Importance of compliance with diet medication stressed and patient verbalized standing. I discussed calcium score report at length.  She was advised to walk at least half an hour a day on a daily basis.  She promises to do so. Essential hypertension: Blood pressure is stable and diet was emphasized.  Lifestyle modification urged salt intake issues discussed relaxation techniques and meditation like programs discussed.  She promises to comply Mixed dyslipidemia: On lipid-lowering medications followed by primary care.  Lipids reviewed from [facility] she and found to be fine and at goal. Obesity: Weight reduction stressed diet emphasized and promises to do better.  Risks of obesity. Patient will be seen in follow-up appointment in 12 months or earlier if the patient has any concerns.    Medication Adjustments/Labs and Tests Ordered: Current medicines are reviewed at length with the patient today.  Concerns regarding medicines are outlined above.  No orders of the defined types were placed in this encounter.  No orders of the defined types were placed in this encounter.    No chief complaint on file.    History of Present Illness:    Krista [name] is a 53 y.o. female.  Patient has past medical history of elevated calcium score, essential hypertension, , diabetes mellitus and obesity.  She leads a sedentary lifestyle.  She denies any chest pain orthopnea or PND.  At the time of my evaluation, the patient is alert  awake oriented and in no distress.  Past Medical History:  Diagnosis Date   Abnormal EKG 11/22/2012   TWI's, suggesting possible ischemia    Abnormal Pap smear of cervix 2006   Leep, colpo   Anxiety    Arthritis    Chest pain    Diabetes mellitus due to underlying condition with unspecified complications (HCC) 09/29/2020   DM type 2 (diabetes mellitus, type 2) (HCC)    type 2   DOE (dyspnea on exertion) 11/22/2012   Elevated coronary artery calcium score 11/23/2023   Fibroid    GERD (gastroesophageal reflux disease)    GIST (gastrointestinal stromal tumor), malignant (HCC)    History of palpitations yrs ago   PVCs - wore holter monitor, negative results   HTN (hypertension) 11/22/2012   Hypertension    Iron  deficiency anemia 06/12/2023   Mixed dyslipidemia 04/05/2021   Obesity (BMI 35.0-39.9 without comorbidity) 11/23/2023   OSA on CPAP 11/22/2012   Palpitations    Restless leg syndrome    patient denies this dx   Seasonal allergies    Status post laparoscopic hysterectomy 07/21/2015   STD (sexually transmitted disease)    hx HPV   Stomach cancer (HCC)    SVD (spontaneous vaginal delivery)    x 1   Vulvar cyst     Past Surgical History:  Procedure Laterality Date   CERVICAL BIOPSY  W/ LOOP ELECTRODE EXCISION  2006   colonscopy  09/2019   polyps return in 5  years   COLPOSCOPY  2006   CYSTOSCOPY N/A 07/21/2015   Procedure: CYSTOSCOPY;  Surgeon: Bobie FORBES Cathlyn JAYSON Nikki, MD;  Location: [facility];  Service: Gynecology;  Laterality: N/A;   ESOPHAGOGASTRODUODENOSCOPY N/A 03/25/2021   Procedure: ESOPHAGOGASTRODUODENOSCOPY (EGD);  Surgeon: Teressa Toribio SQUIBB, MD;  Location: [facility];  Service: Endoscopy;  Laterality: N/A;   ESOPHAGOGASTRODUODENOSCOPY (EGD) WITH PROPOFOL  N/A 12/12/2019   Procedure: ESOPHAGOGASTRODUODENOSCOPY (EGD) WITH PROPOFOL ;  Surgeon: Teressa Toribio SQUIBB, MD;  Location: [facility];  Service: Endoscopy;  Laterality: N/A;   EUS N/A 12/12/2019   Procedure:  UPPER ENDOSCOPIC ULTRASOUND (EUS) LINEAR;  Surgeon: Teressa Toribio SQUIBB, MD;  Location: [facility];  Service: Endoscopy;  Laterality: N/A;   EUS N/A 03/25/2021   Procedure: UPPER ENDOSCOPIC ULTRASOUND (EUS) RADIAL;  Surgeon: Teressa Toribio SQUIBB, MD;  Location: [facility];  Service: Endoscopy;  Laterality: N/A;   FINE NEEDLE ASPIRATION N/A 12/12/2019   Procedure: FINE NEEDLE ASPIRATION (FNA) LINEAR;  Surgeon: Teressa Toribio SQUIBB, MD;  Location: [facility];  Service: Endoscopy;  Laterality: N/A;   LESION REMOVAL Left 11/11/2019   Procedure: EXCISION OF LEFT VULVAR CYST;  Surgeon: Cathlyn JAYSON Nikki Bobie FORBES, MD;  Location: [facility];  Service: Gynecology;  Laterality: Left;   ROBOTIC ASSISTED TOTAL HYSTERECTOMY WITH SALPINGECTOMY Bilateral 07/21/2015   Procedure: ROBOTIC ASSISTED TOTAL HYSTERECTOMY WITH SALPINGECTOMY;  Surgeon: Bobie FORBES Cathlyn JAYSON Nikki, MD;  Location: [facility];  Service: Gynecology;  Laterality: Bilateral;   TRIGGER FINGER RELEASE Bilateral 12/27/2023   Procedure: RELEASE, A1 PULLEY, FOR TRIGGER FINGER RIGHT, injection left middle finger;  Surgeon: Romona Harari, MD;  Location: [facility];  Service: Orthopedics;  Laterality: Bilateral;  right middle and ring finger   UPPER GI ENDOSCOPY  09/2019   WISDOM TOOTH EXTRACTION  1990's    Current Medications: Active Medications[1]   Allergies:   Ace inhibitors, Elemental sulfur, and Penicillins   Social History   Socioeconomic History   Marital status: Married    Spouse name: TONY   Number of children: 1   Years of education: 12 + 4   Highest education level: Not on file  Occupational History   Occupation: [employer] MANAGER  Tobacco Use   Smoking status: Never   Smokeless tobacco: Never  Vaping Use   Vaping status: Never Used  Substance and Sexual Activity   Alcohol use: Yes    Comment: 1 drink/month   Drug use: No   Sexual activity: Yes    Partners: Male    Birth control/protection: Surgical     Comment: R-TLH/Bil.salingectomy  Other Topics Concern   Not on file  Social History Narrative   Not on file   Social Drivers of Health   Tobacco Use: Low Risk (01/16/2024)   Patient History    Smoking Tobacco Use: Never    Smokeless Tobacco Use: Never    Passive Exposure: Not on file  Financial Resource Strain: Not on file  Food Insecurity: Not on file  Transportation Needs: Not on file  Physical Activity: Not on file  Stress: Not on file  Social Connections: Not on file  Depression (EYV7-0): Not on file  Alcohol Screen: Not on file  Housing: Not on file  Utilities: Not on file  Health Literacy: Not on file     Family History: The patient's family history includes Alzheimer's disease in her paternal grandfather; Breast cancer in her cousin and paternal aunt; Cancer in her maternal aunt; Diabetes in her father and paternal grandmother; Hypertension  in her father, maternal grandmother, mother, and paternal grandmother; Ovarian cancer in her maternal grandmother; Stroke in her maternal grandfather.  ROS:   Please see the history of present illness.    All other systems reviewed and are negative.  EKGs/Labs/Other Studies Reviewed:    The following studies were reviewed today: .SABRA   I discussed my findings with the patient at length   Recent Labs: 12/04/2023: ALT 26; BUN 10; Creatinine 0.67; Hemoglobin 11.9; Platelet Count 232; Potassium 3.4; Sodium 143  Recent Lipid Panel No results found for: CHOL, TRIG, HDL, CHOLHDL, VLDL, LDLCALC, LDLDIRECT  Physical Exam:    VS:  BP 130/86   Pulse 78   Ht 5' 5.5 (1.664 m)   Wt 220 lb (99.8 kg)   LMP 06/30/2015   SpO2 98%   BMI 36.05 kg/m     Wt Readings from Last 3 Encounters:  01/16/24 220 lb (99.8 kg)  12/27/23 219 lb 9.3 oz (99.6 kg)  12/08/23 218 lb 8 oz (99.1 kg)     GEN: Patient is in no acute distress HEENT: Normal NECK: No JVD; No carotid bruits LYMPHATICS: No lymphadenopathy CARDIAC: Hear sounds  regular, 2/6 systolic murmur at the apex. RESPIRATORY:  Clear to auscultation without rales, wheezing or rhonchi  ABDOMEN: Soft, non-tender, non-distended MUSCULOSKELETAL:  No edema; No deformity  SKIN: Warm and dry NEUROLOGIC:  Alert and oriented x 3 PSYCHIATRIC:  Normal affect   Signed, Jennifer JONELLE Crape, MD  01/16/2024 3:43 PM    [facility]     [1]  Current Meds  Medication Sig   acetaminophen  (TYLENOL ) 500 MG tablet Take 1,000 mg by mouth every 8 (eight) hours as needed for mild pain.   albuterol (VENTOLIN HFA) 108 (90 Base) MCG/ACT inhaler Inhale 2 puffs into the lungs every 4 (four) hours as needed for shortness of breath.   baclofen (LIORESAL) 10 MG tablet Take 10 mg by mouth daily as needed for muscle spasms.    budesonide-formoterol (SYMBICORT) 160-4.5 MCG/ACT inhaler Inhale 2 puffs into the lungs 2 (two) times daily as needed for shortness of breath.   DULoxetine  (CYMBALTA ) 60 MG capsule Take 60 mg by mouth daily.    fexofenadine (ALLEGRA) 180 MG tablet Take 180 mg by mouth daily.    fluticasone (FLONASE) 50 MCG/ACT nasal spray Place 2 sprays into both nostrils daily.    losartan  (COZAAR ) 100 MG tablet Take 1 tablet (100 mg total) by mouth daily.   montelukast  (SINGULAIR ) 10 MG tablet Take 10 mg by mouth at bedtime.    Multiple Vitamin (MULTIVITAMIN) capsule Take 1 capsule by mouth daily.   OZEMPIC, 2 MG/DOSE, 8 MG/3ML SOPN Inject 2 mg into the skin once a week.   propranolol  ER (INDERAL  LA) 160 MG SR capsule Take 160 mg by mouth daily.   PROTONIX 40 MG tablet Take 40 mg by mouth as needed (indigestion, reflux).   rosuvastatin (CRESTOR) 10 MG tablet Take 10 mg by mouth at bedtime.   TRESIBA FLEXTOUCH 200 UNIT/ML FlexTouch Pen INJECT 10 UNITS DAILY FOR 1 WEEK THEN TITRATE DOSE BY 2 UNITS EVERY 3 DAYS UNTIL MORNING FASTING SUGAR IS AROUND 120. MAX DAILY DOSE 50 UNITS   Vitamin D, Ergocalciferol, (DRISDOL) 50000 UNITS CAPS capsule Take 50,000 Units by  mouth every Thursday.    Current Facility-Administered Medications for the 01/16/24 encounter (Office Visit) with Elysia Grand R, MD  Medication   triamcinolone  acetonide (KENALOG ) 10 MG/ML injection 10 mg

## 2024-01-22 ENCOUNTER — Ambulatory Visit

## 2024-02-13 ENCOUNTER — Ambulatory Visit
Admission: RE | Admit: 2024-02-13 | Discharge: 2024-02-13 | Disposition: A | Source: Ambulatory Visit | Attending: Internal Medicine | Admitting: Internal Medicine

## 2024-02-13 DIAGNOSIS — Z1231 Encounter for screening mammogram for malignant neoplasm of breast: Secondary | ICD-10-CM

## 2024-02-14 ENCOUNTER — Ambulatory Visit: Attending: Cardiology | Admitting: Cardiology

## 2024-02-14 ENCOUNTER — Encounter: Payer: Self-pay | Admitting: Cardiology

## 2024-02-14 VITALS — BP 130/84 | HR 80 | Ht 65.6 in | Wt 221.2 lb

## 2024-02-14 DIAGNOSIS — I10 Essential (primary) hypertension: Secondary | ICD-10-CM

## 2024-02-14 DIAGNOSIS — R931 Abnormal findings on diagnostic imaging of heart and coronary circulation: Secondary | ICD-10-CM | POA: Diagnosis not present

## 2024-02-14 DIAGNOSIS — E782 Mixed hyperlipidemia: Secondary | ICD-10-CM | POA: Diagnosis not present

## 2024-02-14 DIAGNOSIS — G4733 Obstructive sleep apnea (adult) (pediatric): Secondary | ICD-10-CM

## 2024-02-14 DIAGNOSIS — E669 Obesity, unspecified: Secondary | ICD-10-CM

## 2024-02-14 NOTE — Progress Notes (Signed)
 " Cardiology Office Note:    Date:  02/14/2024   ID:  Krista [name], DOB 08-03-1970, MRN [id number]  PCP:  Gable Cambric, MD  Cardiologist:  Jennifer JONELLE Crape, MD   Referring MD: Gable Cambric, MD    ASSESSMENT:    1. Elevated coronary artery calcium score   2. Primary hypertension   3. OSA on CPAP   4. Obesity (BMI 35.0-39.9 without comorbidity)   5. Mixed dyslipidemia    PLAN:    In order of problems listed above:  Primary prevention stressed with the patient.  Importance of compliance with diet medication stressed and patient verbalized standing. She was advised to walk at least half an hour a day 5 days a week and she promises to do so. Elevated calcium score: She is on guideline directed medical therapy and doing well. Mixed dyslipidemia: Elevated lipids but now at goal based on findings on the KPN sheet and this was discussed with the patient. Essential hypertension: Blood pressure stable.  Lifestyle modification and exercise was encouraged and she promises to do better. Obesity: Weight reduction was stressed diet was emphasized risks of obesity explained and she promises to do better. Hypokalemia: Potassium was low recently and I will recheck it again.  Diet emphasized. Patient will be seen in follow-up appointment in 6 months or earlier if the patient has any concerns.    Medication Adjustments/Labs and Tests Ordered: Current medicines are reviewed at length with the patient today.  Concerns regarding medicines are outlined above.  No orders of the defined types were placed in this encounter.  No orders of the defined types were placed in this encounter.    No chief complaint on file.    History of Present Illness:    Krista [name] is a 54 y.o. female.  Patient has past medical history of mildly elevated calcium score, essential hypertension, sleep apnea, mixed dyslipidemia and obesity.  Overall she leads a sedentary lifestyle.  She started walking 1 or 2  times a week.  She wants to increase the pregnant.  she mentions to me that her blood pressure is elevated but for most part her blood pressure is stable. Today pressure looks fine.  At the time of my evaluation, the patient is alert awake oriented and in no distress.  Past Medical History:  Diagnosis Date   Abnormal EKG 11/22/2012   TWI's, suggesting possible ischemia    Abnormal Pap smear of cervix 2006   Leep, colpo   Anxiety    Arthritis    Chest pain    Diabetes mellitus due to underlying condition with unspecified complications (HCC) 09/29/2020   DM type 2 (diabetes mellitus, type 2) (HCC)    type 2   DOE (dyspnea on exertion) 11/22/2012   Elevated coronary artery calcium score 11/23/2023   Fibroid    GERD (gastroesophageal reflux disease)    GIST (gastrointestinal stromal tumor), malignant (HCC)    History of palpitations yrs ago   PVCs - wore holter monitor, negative results   HTN (hypertension) 11/22/2012   Hypertension    Iron  deficiency anemia 06/12/2023   Mixed dyslipidemia 04/05/2021   Obesity (BMI 35.0-39.9 without comorbidity) 11/23/2023   OSA on CPAP 11/22/2012   Palpitations    Restless leg syndrome    patient denies this dx   Seasonal allergies    Status post laparoscopic hysterectomy 07/21/2015   STD (sexually transmitted disease)    hx HPV   Stomach cancer (HCC)    SVD (  spontaneous vaginal delivery)    x 1   Vulvar cyst     Past Surgical History:  Procedure Laterality Date   CERVICAL BIOPSY  W/ LOOP ELECTRODE EXCISION  2006   colonscopy  09/2019   polyps return in 5 years   COLPOSCOPY  2006   CYSTOSCOPY N/A 07/21/2015   Procedure: CYSTOSCOPY;  Surgeon: Bobie FORBES Cathlyn JAYSON Nikki, MD;  Location: [facility];  Service: Gynecology;  Laterality: N/A;   ESOPHAGOGASTRODUODENOSCOPY N/A 03/25/2021   Procedure: ESOPHAGOGASTRODUODENOSCOPY (EGD);  Surgeon: Teressa Toribio SQUIBB, MD;  Location: [facility];  Service: Endoscopy;  Laterality: N/A;    ESOPHAGOGASTRODUODENOSCOPY (EGD) WITH PROPOFOL  N/A 12/12/2019   Procedure: ESOPHAGOGASTRODUODENOSCOPY (EGD) WITH PROPOFOL ;  Surgeon: Teressa Toribio SQUIBB, MD;  Location: [facility];  Service: Endoscopy;  Laterality: N/A;   EUS N/A 12/12/2019   Procedure: UPPER ENDOSCOPIC ULTRASOUND (EUS) LINEAR;  Surgeon: Teressa Toribio SQUIBB, MD;  Location: [facility];  Service: Endoscopy;  Laterality: N/A;   EUS N/A 03/25/2021   Procedure: UPPER ENDOSCOPIC ULTRASOUND (EUS) RADIAL;  Surgeon: Teressa Toribio SQUIBB, MD;  Location: [facility];  Service: Endoscopy;  Laterality: N/A;   FINE NEEDLE ASPIRATION N/A 12/12/2019   Procedure: FINE NEEDLE ASPIRATION (FNA) LINEAR;  Surgeon: Teressa Toribio SQUIBB, MD;  Location: [facility];  Service: Endoscopy;  Laterality: N/A;   LESION REMOVAL Left 11/11/2019   Procedure: EXCISION OF LEFT VULVAR CYST;  Surgeon: Cathlyn JAYSON Nikki Bobie FORBES, MD;  Location: [facility];  Service: Gynecology;  Laterality: Left;   ROBOTIC ASSISTED TOTAL HYSTERECTOMY WITH SALPINGECTOMY Bilateral 07/21/2015   Procedure: ROBOTIC ASSISTED TOTAL HYSTERECTOMY WITH SALPINGECTOMY;  Surgeon: Bobie FORBES Cathlyn JAYSON Nikki, MD;  Location: [facility];  Service: Gynecology;  Laterality: Bilateral;   TRIGGER FINGER RELEASE Bilateral 12/27/2023   Procedure: RELEASE, A1 PULLEY, FOR TRIGGER FINGER RIGHT, injection left middle finger;  Surgeon: Romona Harari, MD;  Location: [facility];  Service: Orthopedics;  Laterality: Bilateral;  right middle and ring finger   UPPER GI ENDOSCOPY  09/2019   WISDOM TOOTH EXTRACTION  1990's    Current Medications: Active Medications[1]   Allergies:   Ace inhibitors, Elemental sulfur, and Penicillins   Social History   Socioeconomic History   Marital status: Married    Spouse name: TONY   Number of children: 1   Years of education: 12 + 4   Highest education level: Not on file  Occupational History   Occupation: [employer] MANAGER  Tobacco Use   Smoking status:  Never   Smokeless tobacco: Never  Vaping Use   Vaping status: Never Used  Substance and Sexual Activity   Alcohol use: Yes    Comment: 1 drink/month   Drug use: No   Sexual activity: Yes    Partners: Male    Birth control/protection: Surgical    Comment: R-TLH/Bil.salingectomy  Other Topics Concern   Not on file  Social History Narrative   Not on file   Social Drivers of Health   Tobacco Use: Low Risk (02/14/2024)   Patient History    Smoking Tobacco Use: Never    Smokeless Tobacco Use: Never    Passive Exposure: Not on file  Financial Resource Strain: Not on file  Food Insecurity: Not on file  Transportation Needs: Not on file  Physical Activity: Not on file  Stress: Not on file  Social Connections: Not on file  Depression (EYV7-0): Not on file  Alcohol Screen: Not on file  Housing: Not on file  Utilities: Not on  file  Health Literacy: Not on file     Family History: The patient's family history includes Alzheimer's disease in her paternal grandfather; Breast cancer in her cousin and paternal aunt; Cancer in her maternal aunt; Diabetes in her father and paternal grandmother; Hypertension in her father, maternal grandmother, mother, and paternal grandmother; Ovarian cancer in her maternal grandmother; Stroke in her maternal grandfather.  ROS:   Please see the history of present illness.    All other systems reviewed and are negative.  EKGs/Labs/Other Studies Reviewed:    The following studies were reviewed today: .SABRA   I discussed my findings with the patient at length   Recent Labs: 12/04/2023: ALT 26; BUN 10; Creatinine 0.67; Hemoglobin 11.9; Platelet Count 232; Potassium 3.4; Sodium 143  Recent Lipid Panel No results found for: CHOL, TRIG, HDL, CHOLHDL, VLDL, LDLCALC, LDLDIRECT  Physical Exam:    VS:  BP 130/84   Pulse 80   Ht 5' 5.6 (1.666 m)   Wt 221 lb 3.2 oz (100.3 kg)   LMP 06/30/2015   SpO2 99%   BMI 36.14 kg/m     Wt Readings  from Last 3 Encounters:  02/14/24 221 lb 3.2 oz (100.3 kg)  01/16/24 220 lb (99.8 kg)  12/27/23 219 lb 9.3 oz (99.6 kg)     GEN: Patient is in no acute distress HEENT: Normal NECK: No JVD; No carotid bruits LYMPHATICS: No lymphadenopathy CARDIAC: Hear sounds regular, 2/6 systolic murmur at the apex. RESPIRATORY:  Clear to auscultation without rales, wheezing or rhonchi  ABDOMEN: Soft, non-tender, non-distended MUSCULOSKELETAL:  No edema; No deformity  SKIN: Warm and dry NEUROLOGIC:  Alert and oriented x 3 PSYCHIATRIC:  Normal affect   Signed, Jennifer JONELLE Crape, MD  02/14/2024 2:28 PM    [facility]      [1]  Current Meds  Medication Sig   acetaminophen  (TYLENOL ) 500 MG tablet Take 1,000 mg by mouth every 8 (eight) hours as needed for mild pain.   albuterol (VENTOLIN HFA) 108 (90 Base) MCG/ACT inhaler Inhale 2 puffs into the lungs every 4 (four) hours as needed for shortness of breath.   baclofen (LIORESAL) 10 MG tablet Take 10 mg by mouth daily as needed for muscle spasms.    budesonide-formoterol (SYMBICORT) 160-4.5 MCG/ACT inhaler Inhale 2 puffs into the lungs 2 (two) times daily as needed for shortness of breath.   DULoxetine  (CYMBALTA ) 60 MG capsule Take 60 mg by mouth daily.    fluticasone (FLONASE) 50 MCG/ACT nasal spray Place 2 sprays into both nostrils daily.    losartan  (COZAAR ) 100 MG tablet Take 1 tablet (100 mg total) by mouth daily.   montelukast  (SINGULAIR ) 10 MG tablet Take 10 mg by mouth at bedtime.    Multiple Vitamin (MULTIVITAMIN) capsule Take 1 capsule by mouth daily.   OZEMPIC, 2 MG/DOSE, 8 MG/3ML SOPN Inject 2 mg into the skin once a week.   propranolol  ER (INDERAL  LA) 160 MG SR capsule Take 160 mg by mouth daily.   PROTONIX 40 MG tablet Take 40 mg by mouth as needed (indigestion, reflux).   rosuvastatin (CRESTOR) 10 MG tablet Take 10 mg by mouth at bedtime.   TRESIBA FLEXTOUCH 200 UNIT/ML FlexTouch Pen INJECT 10 UNITS DAILY FOR 1  WEEK THEN TITRATE DOSE BY 2 UNITS EVERY 3 DAYS UNTIL MORNING FASTING SUGAR IS AROUND 120. MAX DAILY DOSE 50 UNITS   Vitamin D, Ergocalciferol, (DRISDOL) 50000 UNITS CAPS capsule Take 50,000 Units by mouth every Thursday.    [  DISCONTINUED] fexofenadine (ALLEGRA) 180 MG tablet Take 180 mg by mouth daily.    Current Facility-Administered Medications for the 02/14/24 encounter (Office Visit) with Ollie Esty R, MD  Medication   triamcinolone  acetonide (KENALOG ) 10 MG/ML injection 10 mg   "

## 2024-02-14 NOTE — Patient Instructions (Signed)
Medication Instructions:  Your physician recommends that you continue on your current medications as directed. Please refer to the Current Medication list given to you today.  *If you need a refill on your cardiac medications before your next appointment, please call your pharmacy*   Lab Work: Your physician recommends that you have a BMP today in the office.  If you have labs (blood work) drawn today and your tests are completely normal, you will receive your results only by: MyChart Message (if you have MyChart) OR A paper copy in the mail If you have any lab test that is abnormal or we need to change your treatment, we will call you to review the results.   Testing/Procedures: None ordered   Follow-Up: At [facility], you and your health needs are our priority.  As part of our continuing mission to provide you with exceptional heart care, we have created designated Provider Care Teams.  These Care Teams include your primary Cardiologist (physician) and Advanced Practice Providers (APPs -  Physician Assistants and Nurse Practitioners) who all work together to provide you with the care you need, when you need it.  We recommend signing up for the patient portal called "MyChart".  Sign up information is provided on this After Visit Summary.  MyChart is used to connect with patients for Virtual Visits (Telemedicine).  Patients are able to view lab/test results, encounter notes, upcoming appointments, etc.  Non-urgent messages can be sent to your provider as well.   To learn more about what you can do with MyChart, go to [email or web address].    Your next appointment:   9 month(s)  The format for your next appointment:   In Person  Provider:   Belva Crome, MD    Other Instructions none  Important Information About Sugar

## 2024-02-15 ENCOUNTER — Ambulatory Visit: Payer: Self-pay | Admitting: Cardiology

## 2024-02-15 LAB — BASIC METABOLIC PANEL WITH GFR
BUN/Creatinine Ratio: 16 (ref 9–23)
BUN: 13 mg/dL (ref 6–24)
CO2: 25 mmol/L (ref 20–29)
Calcium: 9.4 mg/dL (ref 8.7–10.2)
Chloride: 98 mmol/L (ref 96–106)
Creatinine, Ser: 0.81 mg/dL (ref 0.57–1.00)
Glucose: 91 mg/dL (ref 70–99)
Potassium: 3.5 mmol/L (ref 3.5–5.2)
Sodium: 140 mmol/L (ref 134–144)
eGFR: 86 mL/min/1.73

## 2024-06-06 ENCOUNTER — Inpatient Hospital Stay

## 2024-06-06 ENCOUNTER — Inpatient Hospital Stay: Admitting: Oncology
